# Patient Record
Sex: Female | Born: 1965 | Race: Black or African American | Hispanic: No | Smoking: Never smoker
Health system: Southern US, Community
[De-identification: ages and names within clinical notes are randomized; demographics above are authoritative.]

## PROBLEM LIST (undated history)

## (undated) DIAGNOSIS — E872 Acidosis: Secondary | ICD-10-CM

## (undated) DIAGNOSIS — N890 Mild vaginal dysplasia: Secondary | ICD-10-CM

## (undated) DIAGNOSIS — N6009 Solitary cyst of unspecified breast: Secondary | ICD-10-CM

## (undated) DIAGNOSIS — G4730 Sleep apnea, unspecified: Secondary | ICD-10-CM

## (undated) DIAGNOSIS — A64 Unspecified sexually transmitted disease: Secondary | ICD-10-CM

## (undated) DIAGNOSIS — H04123 Dry eye syndrome of bilateral lacrimal glands: Secondary | ICD-10-CM

## (undated) DIAGNOSIS — G039 Meningitis, unspecified: Secondary | ICD-10-CM

## (undated) DIAGNOSIS — E8729 Other acidosis: Secondary | ICD-10-CM

## (undated) DIAGNOSIS — F439 Reaction to severe stress, unspecified: Secondary | ICD-10-CM

## (undated) DIAGNOSIS — D069 Carcinoma in situ of cervix, unspecified: Secondary | ICD-10-CM

## (undated) DIAGNOSIS — E079 Disorder of thyroid, unspecified: Secondary | ICD-10-CM

## (undated) HISTORY — DX: Solitary cyst of unspecified breast: N60.09

## (undated) HISTORY — DX: Reaction to severe stress, unspecified: F43.9

## (undated) HISTORY — DX: Dry eye syndrome of bilateral lacrimal glands: H04.123

## (undated) HISTORY — DX: Unspecified sexually transmitted disease: A64

## (undated) HISTORY — DX: Acidosis: E87.2

## (undated) HISTORY — DX: Other acidosis: E87.29

## (undated) HISTORY — PX: ABDOMINAL HYSTERECTOMY: SHX81

## (undated) HISTORY — DX: Disorder of thyroid, unspecified: E07.9

## (undated) HISTORY — DX: Carcinoma in situ of cervix, unspecified: D06.9

## (undated) HISTORY — PX: TONSILLECTOMY AND ADENOIDECTOMY: SHX28

## (undated) HISTORY — DX: Sleep apnea, unspecified: G47.30

## (undated) HISTORY — DX: Meningitis, unspecified: G03.9

## (undated) HISTORY — DX: Mild vaginal dysplasia: N89.0

---

## 1992-04-28 DIAGNOSIS — D069 Carcinoma in situ of cervix, unspecified: Secondary | ICD-10-CM

## 1992-04-28 HISTORY — DX: Carcinoma in situ of cervix, unspecified: D06.9

## 1995-01-24 HISTORY — PX: CERVICAL BIOPSY  W/ LOOP ELECTRODE EXCISION: SUR135

## 1998-02-10 ENCOUNTER — Encounter: Admission: RE | Admit: 1998-02-10 | Discharge: 1998-05-11 | Payer: Self-pay | Admitting: Endocrinology

## 1998-02-21 ENCOUNTER — Other Ambulatory Visit: Admission: RE | Admit: 1998-02-21 | Discharge: 1998-02-21 | Payer: Self-pay | Admitting: *Deleted

## 1998-02-28 ENCOUNTER — Ambulatory Visit (HOSPITAL_COMMUNITY): Admission: RE | Admit: 1998-02-28 | Discharge: 1998-02-28 | Payer: Self-pay | Admitting: *Deleted

## 1998-05-06 ENCOUNTER — Other Ambulatory Visit: Admission: RE | Admit: 1998-05-06 | Discharge: 1998-05-06 | Payer: Self-pay | Admitting: *Deleted

## 1998-11-21 ENCOUNTER — Other Ambulatory Visit: Admission: RE | Admit: 1998-11-21 | Discharge: 1998-11-21 | Payer: Self-pay | Admitting: *Deleted

## 1999-07-22 ENCOUNTER — Other Ambulatory Visit: Admission: RE | Admit: 1999-07-22 | Discharge: 1999-07-22 | Payer: Self-pay | Admitting: *Deleted

## 2000-01-22 ENCOUNTER — Emergency Department (HOSPITAL_COMMUNITY): Admission: EM | Admit: 2000-01-22 | Discharge: 2000-01-22 | Payer: Self-pay | Admitting: Internal Medicine

## 2000-02-25 ENCOUNTER — Emergency Department (HOSPITAL_COMMUNITY): Admission: EM | Admit: 2000-02-25 | Discharge: 2000-02-25 | Payer: Self-pay | Admitting: Emergency Medicine

## 2000-03-07 ENCOUNTER — Emergency Department (HOSPITAL_COMMUNITY): Admission: EM | Admit: 2000-03-07 | Discharge: 2000-03-07 | Payer: Self-pay | Admitting: Emergency Medicine

## 2000-03-30 ENCOUNTER — Other Ambulatory Visit: Admission: RE | Admit: 2000-03-30 | Discharge: 2000-03-30 | Payer: Self-pay | Admitting: *Deleted

## 2000-06-08 ENCOUNTER — Encounter: Payer: Self-pay | Admitting: Allergy and Immunology

## 2000-06-08 ENCOUNTER — Encounter: Admission: RE | Admit: 2000-06-08 | Discharge: 2000-06-08 | Payer: Self-pay | Admitting: *Deleted

## 2000-09-21 ENCOUNTER — Emergency Department (HOSPITAL_COMMUNITY): Admission: EM | Admit: 2000-09-21 | Discharge: 2000-09-21 | Payer: Self-pay | Admitting: Emergency Medicine

## 2000-10-06 ENCOUNTER — Other Ambulatory Visit: Admission: RE | Admit: 2000-10-06 | Discharge: 2000-10-06 | Payer: Self-pay | Admitting: *Deleted

## 2001-04-05 ENCOUNTER — Other Ambulatory Visit: Admission: RE | Admit: 2001-04-05 | Discharge: 2001-04-05 | Payer: Self-pay | Admitting: *Deleted

## 2001-06-28 DIAGNOSIS — G039 Meningitis, unspecified: Secondary | ICD-10-CM

## 2001-06-28 HISTORY — DX: Meningitis, unspecified: G03.9

## 2001-07-27 ENCOUNTER — Encounter: Payer: Self-pay | Admitting: Emergency Medicine

## 2001-07-27 ENCOUNTER — Inpatient Hospital Stay (HOSPITAL_COMMUNITY): Admission: EM | Admit: 2001-07-27 | Discharge: 2001-07-28 | Payer: Self-pay

## 2001-09-27 ENCOUNTER — Emergency Department (HOSPITAL_COMMUNITY): Admission: EM | Admit: 2001-09-27 | Discharge: 2001-09-27 | Payer: Self-pay

## 2001-11-09 ENCOUNTER — Other Ambulatory Visit: Admission: RE | Admit: 2001-11-09 | Discharge: 2001-11-09 | Payer: Self-pay | Admitting: *Deleted

## 2002-06-28 HISTORY — PX: HAND SURGERY: SHX662

## 2002-08-26 ENCOUNTER — Observation Stay (HOSPITAL_COMMUNITY): Admission: EM | Admit: 2002-08-26 | Discharge: 2002-08-27 | Payer: Self-pay | Admitting: Emergency Medicine

## 2002-10-04 ENCOUNTER — Encounter: Admission: RE | Admit: 2002-10-04 | Discharge: 2003-01-02 | Payer: Self-pay | Admitting: Endocrinology

## 2002-11-12 ENCOUNTER — Other Ambulatory Visit: Admission: RE | Admit: 2002-11-12 | Discharge: 2002-11-12 | Payer: Self-pay | Admitting: *Deleted

## 2002-11-21 HISTORY — PX: COLPOSCOPY: SHX161

## 2003-01-15 ENCOUNTER — Encounter (INDEPENDENT_AMBULATORY_CARE_PROVIDER_SITE_OTHER): Payer: Self-pay | Admitting: Specialist

## 2003-01-15 ENCOUNTER — Observation Stay (HOSPITAL_COMMUNITY): Admission: RE | Admit: 2003-01-15 | Discharge: 2003-01-16 | Payer: Self-pay | Admitting: Obstetrics and Gynecology

## 2003-11-13 ENCOUNTER — Other Ambulatory Visit: Admission: RE | Admit: 2003-11-13 | Discharge: 2003-11-13 | Payer: Self-pay | Admitting: Obstetrics and Gynecology

## 2005-01-26 ENCOUNTER — Other Ambulatory Visit: Admission: RE | Admit: 2005-01-26 | Discharge: 2005-01-26 | Payer: Self-pay | Admitting: *Deleted

## 2006-02-04 ENCOUNTER — Other Ambulatory Visit: Admission: RE | Admit: 2006-02-04 | Discharge: 2006-02-04 | Payer: Self-pay | Admitting: Obstetrics and Gynecology

## 2006-03-15 ENCOUNTER — Ambulatory Visit (HOSPITAL_COMMUNITY): Admission: RE | Admit: 2006-03-15 | Discharge: 2006-03-15 | Payer: Self-pay | Admitting: Obstetrics and Gynecology

## 2007-01-12 ENCOUNTER — Emergency Department (HOSPITAL_COMMUNITY): Admission: EM | Admit: 2007-01-12 | Discharge: 2007-01-12 | Payer: Self-pay | Admitting: Emergency Medicine

## 2007-02-10 ENCOUNTER — Other Ambulatory Visit: Admission: RE | Admit: 2007-02-10 | Discharge: 2007-02-10 | Payer: Self-pay | Admitting: Obstetrics and Gynecology

## 2007-02-15 ENCOUNTER — Encounter: Admission: RE | Admit: 2007-02-15 | Discharge: 2007-02-15 | Payer: Self-pay | Admitting: Obstetrics and Gynecology

## 2007-03-01 ENCOUNTER — Ambulatory Visit: Payer: Self-pay | Admitting: Internal Medicine

## 2007-03-01 LAB — CONVERTED CEMR LAB: Tissue Transglutaminase Ab, IgA: 0.6 units (ref <7)

## 2007-09-21 DIAGNOSIS — F341 Dysthymic disorder: Secondary | ICD-10-CM | POA: Insufficient documentation

## 2007-09-21 DIAGNOSIS — E039 Hypothyroidism, unspecified: Secondary | ICD-10-CM | POA: Insufficient documentation

## 2007-09-21 DIAGNOSIS — E109 Type 1 diabetes mellitus without complications: Secondary | ICD-10-CM | POA: Insufficient documentation

## 2007-09-21 DIAGNOSIS — R110 Nausea: Secondary | ICD-10-CM | POA: Insufficient documentation

## 2007-11-27 DIAGNOSIS — A64 Unspecified sexually transmitted disease: Secondary | ICD-10-CM

## 2007-11-27 HISTORY — DX: Unspecified sexually transmitted disease: A64

## 2008-02-23 ENCOUNTER — Other Ambulatory Visit: Admission: RE | Admit: 2008-02-23 | Discharge: 2008-02-23 | Payer: Self-pay | Admitting: Obstetrics and Gynecology

## 2008-09-06 ENCOUNTER — Encounter: Admission: RE | Admit: 2008-09-06 | Discharge: 2008-09-06 | Payer: Self-pay | Admitting: Obstetrics and Gynecology

## 2008-09-20 ENCOUNTER — Encounter: Admission: RE | Admit: 2008-09-20 | Discharge: 2008-09-20 | Payer: Self-pay | Admitting: Obstetrics and Gynecology

## 2009-04-24 ENCOUNTER — Emergency Department (HOSPITAL_BASED_OUTPATIENT_CLINIC_OR_DEPARTMENT_OTHER): Admission: EM | Admit: 2009-04-24 | Discharge: 2009-04-24 | Payer: Self-pay | Admitting: Emergency Medicine

## 2009-06-28 DIAGNOSIS — G4730 Sleep apnea, unspecified: Secondary | ICD-10-CM

## 2009-06-28 HISTORY — DX: Sleep apnea, unspecified: G47.30

## 2010-03-11 ENCOUNTER — Encounter: Admission: RE | Admit: 2010-03-11 | Discharge: 2010-03-11 | Payer: Self-pay | Admitting: Obstetrics and Gynecology

## 2010-07-19 ENCOUNTER — Encounter: Payer: Self-pay | Admitting: Obstetrics and Gynecology

## 2010-10-01 LAB — URINALYSIS, ROUTINE W REFLEX MICROSCOPIC
Bilirubin Urine: NEGATIVE
Glucose, UA: >1000 mg/dL — AB
Hgb urine dipstick: NEGATIVE
Ketones, ur: 15 mg/dL — AB
Leukocytes, UA: NEGATIVE
Nitrite: NEGATIVE
Protein, ur: NEGATIVE mg/dL
Specific Gravity, Urine: 1.015 (ref 1.005–1.030)
Urobilinogen, UA: 0.2 mg/dL (ref 0.0–1.0)
pH: 5.5 (ref 5.0–8.0)

## 2010-10-01 LAB — COMPREHENSIVE METABOLIC PANEL
ALT: 13 U/L (ref 0–35)
AST: 22 U/L (ref 0–37)
Albumin: 4.1 g/dL (ref 3.5–5.2)
Alkaline Phosphatase: 86 U/L (ref 39–117)
BUN: 16 mg/dL (ref 6–23)
CO2: 28 mEq/L (ref 19–32)
Calcium: 9.5 mg/dL (ref 8.4–10.5)
Chloride: 101 mEq/L (ref 96–112)
Creatinine, Ser: 0.9 mg/dL (ref 0.4–1.2)
GFR calc Af Amer: >60 mL/min (ref >60)
GFR calc non Af Amer: >60 mL/min (ref >60)
Glucose, Bld: 345 mg/dL — ABNORMAL HIGH (ref 70–99)
Potassium: 4.1 mEq/L (ref 3.5–5.1)
Sodium: 140 mEq/L (ref 135–145)
Total Bilirubin: 0.6 mg/dL (ref 0.3–1.2)
Total Protein: 7.3 g/dL (ref 6.0–8.3)

## 2010-10-01 LAB — PREGNANCY, URINE: Preg Test, Ur: NEGATIVE

## 2010-10-01 LAB — URINE MICROSCOPIC-ADD ON

## 2010-10-01 LAB — GLUCOSE, CAPILLARY
Glucose-Capillary: 264 mg/dL — ABNORMAL HIGH (ref 70–99)
Glucose-Capillary: 399 mg/dL — ABNORMAL HIGH (ref 70–99)

## 2010-10-01 LAB — KETONES, QUALITATIVE: Acetone, Bld: NEGATIVE

## 2010-11-10 NOTE — Assessment & Plan Note (Signed)
[facility]                         GASTROENTEROLOGY OFFICE NOTE   NAME:[name], [name]                      MRN:          [id number]  DATE:03/01/2007                            DOB:          1965-08-10    REFERRING PHYSICIAN:  Tera Mater. Evlyn Kanner, M.D.   REASON FOR CONSULTATION:  Nausea and change in bowel habits.   HISTORY:  This is a 45 year old female with longstanding type 1 diabetes  mellitus, hypothyroidism, and anxiety with depression.  The patient is  referred today through the courtesy of Dr. Evlyn Kanner regarding nausea and  change in bowel habits.  The patient reports to me that in mid June she  developed problems with postprandial urgency associated with watery  stools.  She also has had problems with intermittent nausea, though, no  vomiting.  No significant abdominal pain or cramps, though she has had  bloating.  As time has gone on, her symptoms have improved somewhat.  Still with urgency, though, her stools are now soft.  She did have a flu-  like illness back in February of this year where she lost 10-15 pounds.  She is not certain whether she received antibiotics.  She denies having  had prior problems with her bowels.  Her diabetes control has been fair.  She also reports a great deal of stress regarding marital problems which  began around the time of her symptoms.  She does travel to [place] with  her job.  There has been no fevers, or bleeding.  She has had somewhat  decrease in her appetite.  During the course of the interview, the  patient is intermittently tearful.  No prior history of GI problems or  GI evaluations.   PAST MEDICAL HISTORY:  1. Type 1 diabetes mellitus x26 years.  2. Hypothyroidism.  3. Anxiety/depression.   PAST SURGICAL HISTORY:  1. Hysterectomy.  2. Correction of dislocated thumb.  3. Tonsillectomy.   ALLERGIES:  PENICILLIN, SULFA.   CURRENT MEDICATIONS:  1. Insulin pump.  2. Effexor XR 75 mg daily.  3. Levothroid 112 mcg daily.  4. Wellbutrin XL 300 mg daily.  5. Multivitamin.  6. Iron.  7. YAZ.  8. Lunesta at night.  9. She also has been using Zofran p.r.n.  10.Flonase p.r.n.   FAMILY HISTORY:  No family history of gastrointestinal malignancy,  sprue, or other relevant disorders.   SOCIAL HISTORY:  The patient is married with three stepdaughters.  She  lives with her family.  She attended college for two years.  She works  as a Occupational psychologist for [employer].  She does not smoke or  use alcohol.   REVIEW OF SYSTEMS:  Per diagnostic evaluation form.   PHYSICAL EXAMINATION:  GENERAL:  A well-appearing female in no acute  distress.  VITAL SIGNS:  Blood pressure 94/62, heart rate 92, weight 135 pounds.  She is 5 feet 4-1/2 inches in height.  HEENT:  Sclerae anicteric, conjunctivae pink.  Oral mucosa is intact.  There is no aphthous, ulcerations, or thrush.  LUNGS:  Clear.  HEART:  Regular.  ABDOMEN:  Soft without tenderness, mass, or  hernia.  Good bowel sounds  heard.  EXTREMITIES:  Without edema.   LABORATORY DATA:  Hemoglobin A1C from January 27, 2007, was 8.3.   IMPRESSION:  1. Recent problems with altered bowel habits as manifested by      postprandial urgency with loose stools.  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  Possibly      exacerbated by stress.  Alternatively, she could have infectious      diarrhea to agents such as Giardia or possibly bacterial overgrowth      relating to her longstanding diabetes.  In addition, occult sprue      should be excluded.  This does not sound like autonomic (diabetic)      diarrhea.  The overall trend seems to be toward improvement.  2. Nausea, transient and mild, now resolved.   RECOMMENDATIONS:  1. Obtain tissue transglutaminase antibody as a screening test for      sprue.  2. Treat with Flagyl 250 mg p.o. t.i.d. as well as the probiotic      Align, one daily for  two weeks.  3. Office follow-up in four weeks.     Wilhemina Bonito. Marina Goodell, MD  Electronically Signed    JNP/MedQ  DD: 03/01/2007  DT: 03/02/2007  Job #: [id number]   cc:   Jeannett Senior A. Evlyn Kanner, M.D.

## 2010-11-13 NOTE — H&P (Signed)
NAMEAILYNE, PAWLEY                         ACCOUNT NO.:  [id number]   MEDICAL RECORD NO.:  [id number]                   PATIENT TYPE:  INP   LOCATION:  1827                                 FACILITY:  [facility]   PHYSICIAN:  Mark A. Perini, M.D.                DATE OF BIRTH:  March 15, 1966   DATE OF ADMISSION:  08/25/2002  DATE OF DISCHARGE:                                HISTORY & PHYSICAL   CHIEF COMPLAINT:  Overdose.   HISTORY OF PRESENT ILLNESS:  [name] [name] is a pleasant 45 year old female  with a history of type 1 diabetes mellitus who presented via EMS with  lethargy and tachycardia.  She reported taking six Benadryl tablets, three  Sonata tablets, and a handful of Effexor tablets.  She also reports taking  two or three Wellbutrin tablets, and 40 units at least of Humalog as a  bolus.  She denies trying to harm herself, and states that she just wanted  to go to sleep.  Her husband reports that she was in bed all day and seemed  depressed.  She has done this on the last two or three Saturdays.  She has  had some increased work stress lately.  There is no clear relationship  problem noted.  She denies any suicidal ideation, homicidal ideation, or  psychotic features; however, she does let on to having some depression.  She, in the emergency room, required two amps of D-50 for low blood sugar.  She has been given charcoal via the NG tube.  The time of overdose was most  likely sometime between 8 and 9:30 p.m.   PAST MEDICAL HISTORY:  1. Type 1 diabetes mellitus, on Humalog pump.  2. Hypothyroidism.  3. History of urosepsis in 1/03.  4. History of migraines.  5. History of depression.  6. Mild hearing loss diagnosed recently.   ALLERGIES:  1. SULFA.  2. PENICILLIN.   MEDICATIONS:  1. Humalog pump.  2. Synthroid, unclear dose.  3. Effexor 75 mg, unclear dose.  4. Allegra, unclear dose.  5. Sonata 10 mg as needed.  6. Benadryl.  7. Wellbutrin, unclear dose.   SOCIAL  HISTORY:  She is married since 2002.  She uses no tobacco, no  alcohol, no drugs.  Her husband's name is Richard.  She has no children.  She works in an office.  She is seekingly looking for another job, as she  has had some job stress lately.   FAMILY HISTORY:  Father died at age 41 after having had a kidney transplant  for approximately six years.  Mother is still alive in her 9s or 83s, but  in good health.  She has one older sister who is healthy.   REVIEW OF SYSTEMS:  The patient simply reports that she has had a bad day,  but she cannot elaborate on what has made the day bad.  She denies any  chest  pain, shortness of breath, or other pain.  She denies any abdominal pain.  She has had normal bowel and bladder function lately.  Her last menstrual  period was two weeks ago, and was normal for her.   PHYSICAL EXAMINATION:  VITAL SIGNS:  Blood pressure now 87/50 (it was 110/61  on admission), pulse now 120s (it was 140 on admission), respiratory rate  20, temperature 99.1, 100% saturation on room air.  GENERAL:  She is lethargic but awake and responds appropriately to questions  and commands.  Pupils equal, round and reactive to light.  There is no  icterus.  There is no JVD.  LUNGS:  Clear to auscultation bilaterally.  HEART:  Regular rate and rhythm with no murmur.  ABDOMEN:  Soft, nontender, nondistended.  No masses or hepatosplenomegaly.  EXTREMITIES:  There is no edema.  She moves extremities x4.   LABORATORY DATA:  hCG is negative.  Negative tricyclic screen.  Urine drug  screen is negative.  Salicylate level is 7.3.  Acetaminophen level is less  than 10.0.  Alcohol level is less than 5.  The pH is 7.41, PCO2 40, bicarb  25, sodium 140, potassium 3.1, chloride 106, CO2 25, BUN 15, creatinine 0.9,  glucose 80, anion gap 12.  EKG reveals sinus tachycardia, but is otherwise  normal.  White count is 9.9 with 77% segs, 18% lymphocytes, 4% monocytes,  hemoglobin 13.5, platelet count  256,000.   ASSESSMENT AND PLAN:  [name] [name] presents with an intentional overdose of  Humalog insulin, Effexor, Sonata, Wellbutrin, and Benadryl.  She has been  given charcoal.  We will keep the NG tube in place.  She is mildly  hypotensive.  We are giving fluid boluses or normal saline.  We will admit  to the intensive care unit.  Will place on suicide precautions and put a  sitter in her room.  She will need blood sugar checks every hour, and D-50  ampules as needed.  The patient's status is guarded currently.  We will also  check a TSH, hemoglobin, A1C, magnesium, and phosphorus level.  She will  need psychiatric consultation once medically stable.                                               Mark A. Waynard Edwards, M.D.    MAP/MEDQ  D:  08/26/2002  T:  08/26/2002  Job:  [id number]   cc:   Jeannett Senior A. Evlyn Kanner, M.D.  [address]  [place]  [place] [address]  Fax: [phone]

## 2010-11-13 NOTE — H&P (Signed)
[facility]  Patient:    [name] [name], [name] [name] Visit Number: [id number] MRN: [id number]          Service Type: EMS Location: [facility] Attending Physician:  Pearletha Alfred Dictated by:   Pearletha Furl Jacky Kindle, M.D. Admit Date:  07/26/2001                           History and Physical  CHIEF COMPLAINT: Headache, nausea, eyes red.  HISTORY OF PRESENT ILLNESS: This is a 45 year old very nice young lady, type 1 diabetic, on insulin pump, presenting at this time upon referral from Dr. Timothy Lasso at the office for evaluation of her headache.  She was evidently seen in the office late in the day on July 26, 2001 and found to have severe headache, nausea and vomiting, and findings felt compatible with a typical migraine.  She had no evidence of meningeal signs at that time but only had a throbbing headache, body aches, conjunctivitis, and arthralgias.  Blood sugars have been relatively stable, and she was treated with Phenergan and Zomig with dramatic improvement in her headache.  She was referred to the emergency room and, unfortunately, she was not seen and evaluated until sometimes after midnight, myself having been contacted at approximately 3 a.m.  The working diagnosis was probable UTI but she was orthostatic, having dropped her pressure upon standing down to 60, requiring a fluid bolus.  Therefore, I was contacted for admission for IV fluids.  Additionally, she was given 2 g Rocephin.  Upon my evaluation at 3 a.m. she relates that she is actually much better following the Zomig in the office and the headache is 70-80% better. She has had no rash and despite initial nausea and vomiting has had no further symptomatology as well.  She denies any cough or congestion.  She has had no rash.  Bowel habits have been normal.  She has related some mild dysuria.  She does relate that the light bothers her eyes, but has had no neck stiffness.  ALLERGIES:  1. SULFA.  2.  PENICILLIN.  CURRENT MEDICATIONS:  1. Currently on an insulin pump with continuous infusion of Humalog insulin.  2. Synthroid.  3. Ambien.  4. Effexor.  5. Allegra.  PAST MEDICAL HISTORY:  1. Diabetes mellitus, type 1, long-standing.  2. Hypothyroidism.  SOCIAL HISTORY: The patient is married.  Does not smoke or drink.  She appears to be a fairly compliant diabetic, according to outside records.  PHYSICAL EXAMINATION:  VITAL SIGNS: Temperature 100.9 degrees, blood pressure 107/67, pulse 100 and regular, respiratory rate 18 and unlabored.  GENERAL: The patient appears ill.  According to husband, seems better.  HEENT: Normocephalic, atraumatic.  Mild periorbital edema.  Significant conjunctival injection, both palpebral and conjunctival mucosa.  PERRL.  No papilledema.  EOMI.  Visual fields intact.  NECK: Supple.  No JVD.  Negative Kernigs or Brudzinskis sign.  No meningismus.  Carotids 2+/4+.  No bruits.  LUNGS: Clear bilaterally.  CARDIOVASCULAR: Regular rate and rhythm.  No murmur, gallop, rub, or heave.  ABDOMEN: Soft, nontender.  No hepatosplenomegaly.  No rebound or guarding.  EXTREMITIES: No clubbing, cyanosis, or edema.  Joints without effusions.  No rashes.  Warm distally, intact pulses.  NEUROLOGIC: Awake and alert, interactive, oriented x4.  Full detailed examination normal.  ASSESSMENT:  1. Febrile illness with headache, conjunctivitis, and pyuria.  Considerations     include urinary tract infection, viral syndrome with element of  migraine.     No evidence of meningitis, albeit the patient has already had 2 g     Rocephin, and further consideration will be undertaken by primary     physician when seen on rounds in a few hours, depending on comparison to     prior morning as to whether true lumbar puncture or neurology consultation     is needed or infectious disease consultation, but I suspect this is either     urinary tract infection or viral illness  triggering the migraine headache.  2. Diabetes mellitus, type 1, controlled.  No evidence of diabetic     ketoacidosis.  3. Orthostasis secondary to #1.  To be hydrated aggressively.  No evidence of     frank septic syndrome, with relatively normal laboratories including WBC. Dictated by:   Pearletha Furl Jacky Kindle, M.D. Attending Physician:  Pearletha Alfred DD:  07/27/01 TD:  07/27/01 Job: [id number]

## 2010-11-13 NOTE — Op Note (Signed)
[name] [name], [name] [name]                         ACCOUNT NO.:  [id number]   MEDICAL RECORD NO.:  [id number]                   PATIENT TYPE:  OBV   LOCATION:  9302                                 FACILITY:  [facility]   PHYSICIAN:  Laqueta Linden, M.D.                 DATE OF BIRTH:  [birth year]   DATE OF PROCEDURE:  01/15/2003  DATE OF DISCHARGE:                                 OPERATIVE REPORT   PREOPERATIVE DIAGNOSIS:  Recurrent cervical intraepithelial neoplasia III.   POSTOPERATIVE DIAGNOSIS:  Recurrent cervical intraepithelial neoplasia III.   PROCEDURE:  Transvaginal hysterectomy.   SURGEON:  Laqueta Linden, M.D.   ASSISTANT:  Andres Ege, M.D.   ANESTHESIA:  General LMA.   ESTIMATED BLOOD LOSS:  Less than 50 mL.   URINE OUTPUT:  75 mL.   FLUIDS REPLACED:  1500 mL crystalloid.   COUNTS:  Correct x2.   COMPLICATIONS:  None.   Intraoperative CBG 141.   INDICATIONS:  [name] [name] is a 45 year old para 0 female with recurrent  CIN-III status post LEEP conization, who now has a detached fragment of high-  grade dysplasia in her endocervix, who is to undergo vaginal hysterectomy  versus abdominal hysterectomy for recurrent CIN.  Her history is complicated  by the fact that she has never had a vaginal delivery and has a minimal  amount of cervicovaginal descent.  A vaginal hysterectomy will be attempted,  but she is aware that an abdominal approach may be necessary depending on  intraoperative findings.  Additionally, she is an insulin-dependent diabetic  on an insulin pump.  She and her husband have seen the informed consent film  and have voiced their understanding and acceptance of all risks, benefits,  alternatives, and complications, including but not limited to anesthesia  risk, infection, bleeding possibly requiring transfusion, injury to bowel,  bladder, ureters, vessels, nerves, the possibility of fistula formation,  permanent and irreversible sterilization, as  well as the alternative of a  repeat cervical conization.  Additional risks which are increased due to her  diabetes include infection and poor healing as well as difficulty with  glucose control.  Other risks including DVT, PE, pneumonia, death, and other  unnamed risks were also discussed.  They have voiced their understanding and  acceptance of all risks, understand that this will provide permanent and  irreversible contraception, and agree to proceed.   PROCEDURE:  The patient was taken to the operating room and after proper  identification and consents were ascertained, she was placed on the  operating table in the supine position.  She had received Ancef 1 g IV  antibiotic prophylaxis preoperatively.  Preoperative urine pregnancy test  the morning of surgery was negative.  CBGs were less than 150 per the  patient and hospital check perioperatively.  After the induction of general  LMA, she was placed into the Laird stirrups and the perineum and  vagina were  prepped and draped in the routine sterile fashion.  The bladder was emptied  with a red rubber catheter.  A weighted speculum was placed in the posterior  vagina.  The cervix was noted to be scarred from her prior conization.  The  cervix was grasped with a single-tooth tenaculum with a slight amount of  descent noted.  The portio was then injected with 10 mL of 1:100,000  epinephrine circumferentially.  The posterior cervix was then incised with a  scalpel and the vaginal mucosa advanced off of the cervix bluntly.  The  posterior mucosa was then tented down and the cul-de-sac entered sharply  with curved Mayo scissors.  The incision on the cervix was then carried  around anteriorly with advancement of the vaginal tissues off of the cervix  bluntly with the finger and a Ray-Tec sponge.  The uterosacral ligaments  were clamped, cut, and Heaney-ligated with 0 Vicryl suture and tagged  bilaterally.  A slight amount of additional  descent was noted.  The anterior  peritoneal reflection was then identified and entered sharply using curved  Metzenbaum scissors.  There was no evidence of injury or entry into the  bladder.  At this point a curved Heaney clamp was placed across the cardinal  ligament-broad ligament complex, incorporating both the anterior and  posterior peritoneum, with pedicles cut and suture ligated.  Improved  descent was noted, and the uterus was noted to be quite small with a fundal  serosal fibroid.  Both tubes and ovaries appeared normal.  Curved Heaney  clamps were placed across the proximal vascular pedicles bilaterally, and  the pedicles were cut and triply ligated with two free ties and a stitch of  0 Vicryl.  Both tubes and ovaries appeared normal.  The adnexal pedicles  were noted to be hemostatic and were released into the peritoneal cavity.  The posterior vaginal cuff was then reefed to the posterior peritoneum with  0 Vicryl locking suture.  The parietal peritoneum was then closed in a  pursestring fashion with exteriorization of all pedicles other than the  adnexal pedicles.  Hemostasis was noted to be excellent.  Counts were  correct prior to closure of the peritoneum.  The vaginal cuff was then  closed from side to side using interrupted figure-of-eight sutures of 0  Vicryl.  The uterosacral tags were then tied in the midline.  Reinsertion of  the red rubber catheter revealed no urine output.  It was then felt that  this catheter was possibly clotted with K-Y jelly because insertion of a  Foley catheter had prompt efflux of 75 mL of clear urine.  The Foley  catheter will be left in place in the PACU and if urine output is  maintained, it will be removed prior to transfer to the floor.  Intraoperative CBG was 141.  The patient was extubated and stable on  transfer to the recovery room.  She will have hourly monitoring of CBGs, followed by q.4h., with adjustment in her Novolog insulin  pump as necessary.  Of note, the specimen was tagged on the cervix at 12 o'clock with a suture  and was sent fresh to pathology so that the cervix can be cut in a cone  fashion.   The patient was stable on transfer to the recovery room.  Estimated blood  loss less than 50 mL.  Complications:  None.  Laqueta Linden, M.D.    LKS/MEDQ  D:  01/15/2003  T:  01/15/2003  Job:  [id number]   cc:   Jeannett Senior A. Evlyn Kanner, M.D.  [address]  [place]  [place] [address]  Fax: [phone]

## 2010-11-13 NOTE — Discharge Summary (Signed)
[place]  Patient:    [name] [name], [name] [name] Visit Number: [id number] MRN: [id number]          Service Type: MED Location: [id number] Attending Physician:  Gwen Pounds Dictated by:   Gwen Pounds, M.D. Admit Date:  07/26/2001 Discharge Date: 07/28/2001   CC:         Jeannett Senior A. Evlyn Kanner, M.D.  Richard A. Jacky Kindle, M.D.   Discharge Summary  PRIMARY CARE Kostantinos Tallman:  Tera Mater. [place], M.D.  DISCHARGE MEDICATIONS:  Home medications, insulin pump, and finish Tequin 400 mg for a five more day course.  DISCHARGE DIAGNOSES: 1. Urosepsis. 2. Dehydration. 3. Severe headache. 4. Possible viral prodrome. 5. Insulin-dependent diabetes mellitus. 6. Hypothyroid.  DISCHARGE PROCEDURES: 1. Cranial CT without evidence of intracranial hemorrhage, brain edema, or    mass effect.  The ventricles are normal.  Therefore, otherwise negative    noncontrast cranial CT. 2. Chest x-ray.  No acute disease.  HISTORY OF PRESENT ILLNESS:  Briefly, this is a 45 year old African American female with diabetes mellitus and hypothyroid with history of migraine headaches, for which she had taken Zomig in the past.  She came into the office on January 29 with complaint of a headache across the forehead, throbbing, minimal neck symptoms with myalgias, nausea, vomiting x1, significant photophobia, achy joint swelling, unable to keep down p.o., and looking and feeling awful.  She denied chest pain, shortness of breath, URI, UTI, confusion, or other symptoms at that point.  Workup was relatively negative except she appeared ill.  She did not have any meningeal signs.  Her conjunctivae were indurated.  The initial plan was to give Phenergan and Zomig, follow her for a period of time, and see how well she did. Unfortunately, after 40 minutes of this type of treatment, she still felt fairly awful.  When she stood up, she was orthostatic.  Therefore, we sent her to the ED.  In the  ED, she had a cranial CT, chest x-ray, and labs.  Chest CT and chest x-ray were normal.  Labs were all within normal limits except for the fact that she had a large leukocyte esterase with urine microscopic showing 21-50 white blood cells per high-power field and many bacteria.  She was also significantly orthostatic, consistent with dehydration.  Dr. Jacky Kindle was called for inpatient admission.  Dr. Jacky Kindle also felt that she did not have meningeal signs and did not feel strongly about a lumbar puncture.  In the ED, she received 2 g of Rocephin and was placed on Tequin for her urinary tract infection.  By the following morning, at 8 a.m., I had seen her and her headache was markedly improved.  She was very tired, still with indurated eyes, still no meningeal signs, but was only starting to tolerate some orals. She was still having some orthostasis.  She was given aggressive IV fluids. Throughout the day, she continued to improve.  She started taking p.o., all her vital signs were stable and normal, no more fevers, and the following morning, on July 28, 2001, [name] [name] was close to baseline, no headache, and ready for discharge.  All cultures have been negative to date.  One other test that was ordered was a sed rate.  The sed rate was 14, well within normal limits.  She will continue a seven-day course of antibiotics and will be given Tequin on discharge.  To sum up, what it seems like happened with Ms. Nabor is that she  developed a pretty significant urinary tract infection, significant dehydration, developed a severe headache, and continued to get more and more ill.  I could not explain some of the arthralgias and myalgias and the indurated conjunctivae except for either it is related to the headache or there is also viral prodrome going on and this is starting to get better on its own.  DISCHARGE ACTIVITY:  She is to return to work on Monday.  DISCHARGE DIET:  Diabetic  diet.  AFTERCARE FOLLOWUP INSTRUCTIONS:  She is to call Lupita Leash at Dr. Lyndle Herrlich office and get an appointment in 1-2 weeks or sooner if needed. Dictated by:   Gwen Pounds, M.D. Attending Physician:  Gwen Pounds DD:  07/28/01 TD:  07/28/01 Job: [id number]

## 2010-12-27 HISTORY — PX: SOFT TISSUE CYST EXCISION: SHX2418

## 2011-01-06 ENCOUNTER — Telehealth (INDEPENDENT_AMBULATORY_CARE_PROVIDER_SITE_OTHER): Payer: Self-pay | Admitting: General Surgery

## 2011-01-06 NOTE — Telephone Encounter (Signed)
I printed and gave to Annabelle Harman, who does this job. She will fax the appropriate information to [facility].

## 2011-01-07 DIAGNOSIS — N6089 Other benign mammary dysplasias of unspecified breast: Secondary | ICD-10-CM

## 2011-01-28 ENCOUNTER — Ambulatory Visit (INDEPENDENT_AMBULATORY_CARE_PROVIDER_SITE_OTHER): Payer: 59 | Admitting: General Surgery

## 2011-01-28 ENCOUNTER — Encounter (INDEPENDENT_AMBULATORY_CARE_PROVIDER_SITE_OTHER): Payer: Self-pay | Admitting: General Surgery

## 2011-01-28 DIAGNOSIS — Z9889 Other specified postprocedural states: Secondary | ICD-10-CM

## 2011-01-28 DIAGNOSIS — Z09 Encounter for follow-up examination after completed treatment for conditions other than malignant neoplasm: Secondary | ICD-10-CM

## 2011-01-28 DIAGNOSIS — Z872 Personal history of diseases of the skin and subcutaneous tissue: Secondary | ICD-10-CM

## 2011-01-28 NOTE — Progress Notes (Signed)
Procedure: Status post excision of left breast epidermoid inclusion cyst January 07, 2011  Chief complaint: Postop  History of Present Ilness: 45 year old female comes in for her postoperative appointment after undergoing the above mentioned procedure. She has no questions or concerns today. She denies any fevers or chills. She denies any drainage from the incision. She denies any pain around the incision. She denies any redness. She is not taking any pain medicine.  Physical Exam: Well-developed well-nourished female in no apparent distress Breasts-left breast reveals a well-healed transverse incision from the 11:00 to 1:00 position. No hematoma or seroma. No cellulitis.   Assessment and Plan: Status post excision of left breast epidermoid inclusion cyst. Her incision looks great. I explained to the patient that this cyst was simply a sebaceous cyst. Therefore I did not send it to pathology. I advised her to place sunscreen over the incision should that area be expose to direct sun light. Have released her to full activity. We'll see her on a p.r.n. basis

## 2011-03-26 ENCOUNTER — Other Ambulatory Visit: Payer: Self-pay | Admitting: Obstetrics and Gynecology

## 2011-03-26 DIAGNOSIS — Z1231 Encounter for screening mammogram for malignant neoplasm of breast: Secondary | ICD-10-CM

## 2011-04-02 ENCOUNTER — Ambulatory Visit (HOSPITAL_COMMUNITY)
Admission: RE | Admit: 2011-04-02 | Discharge: 2011-04-02 | Disposition: A | Discharge status: Home / Self Care | Payer: 59 | Source: Ambulatory Visit | Attending: Obstetrics and Gynecology | Admitting: Obstetrics and Gynecology

## 2011-04-02 DIAGNOSIS — Z1231 Encounter for screening mammogram for malignant neoplasm of breast: Secondary | ICD-10-CM | POA: Insufficient documentation

## 2011-04-12 LAB — BASIC METABOLIC PANEL
BUN: 12
CO2: 29
Calcium: 9.9
Chloride: 99
Creatinine, Ser: 0.96
GFR calc Af Amer: >60
GFR calc non Af Amer: >60
Glucose, Bld: 230 — ABNORMAL HIGH
Potassium: 4.2
Sodium: 137

## 2011-04-12 LAB — URINALYSIS, ROUTINE W REFLEX MICROSCOPIC
Bilirubin Urine: NEGATIVE
Glucose, UA: 250 — AB
Hgb urine dipstick: NEGATIVE
Ketones, ur: 40 — AB
Nitrite: NEGATIVE
Protein, ur: NEGATIVE
Specific Gravity, Urine: 1.022
Urobilinogen, UA: 0.2
pH: 7

## 2011-04-12 LAB — PREGNANCY, URINE: Preg Test, Ur: NEGATIVE

## 2011-04-12 LAB — URINE MICROSCOPIC-ADD ON

## 2011-06-08 ENCOUNTER — Encounter: Payer: Self-pay | Admitting: Neurology

## 2011-06-29 DIAGNOSIS — N890 Mild vaginal dysplasia: Secondary | ICD-10-CM

## 2011-06-29 HISTORY — DX: Mild vaginal dysplasia: N89.0

## 2012-05-05 HISTORY — PX: COLPOSCOPY: SHX161

## 2012-06-07 ENCOUNTER — Other Ambulatory Visit: Payer: Self-pay | Admitting: Obstetrics and Gynecology

## 2012-06-07 DIAGNOSIS — Z1231 Encounter for screening mammogram for malignant neoplasm of breast: Secondary | ICD-10-CM

## 2012-06-20 ENCOUNTER — Ambulatory Visit
Admission: RE | Admit: 2012-06-20 | Discharge: 2012-06-20 | Disposition: A | Discharge status: Home / Self Care | Payer: 59 | Source: Ambulatory Visit | Attending: Obstetrics and Gynecology | Admitting: Obstetrics and Gynecology

## 2012-06-20 DIAGNOSIS — Z1231 Encounter for screening mammogram for malignant neoplasm of breast: Secondary | ICD-10-CM

## 2012-06-20 LAB — HM MAMMOGRAPHY

## 2012-06-28 DIAGNOSIS — M069 Rheumatoid arthritis, unspecified: Secondary | ICD-10-CM

## 2012-06-28 HISTORY — DX: Rheumatoid arthritis, unspecified: M06.9

## 2012-09-08 LAB — HM PAP SMEAR

## 2012-09-22 ENCOUNTER — Telehealth: Payer: Self-pay | Admitting: Obstetrics and Gynecology

## 2012-09-22 NOTE — Telephone Encounter (Signed)
Patient notified pap from 09-08-12 (prior to [facility]) was normal and Dr Tresa Res wants patient to have next pap in 6 months with Annual and patient is scheduled for 03-09-13.

## 2012-09-22 NOTE — Telephone Encounter (Signed)
Pt calling to get lab results.

## 2013-02-23 ENCOUNTER — Ambulatory Visit (INDEPENDENT_AMBULATORY_CARE_PROVIDER_SITE_OTHER): Payer: 59 | Admitting: Neurology

## 2013-02-23 ENCOUNTER — Encounter: Payer: Self-pay | Admitting: Neurology

## 2013-02-23 VITALS — BP 103/63 | HR 92 | Temp 98.2°F | Ht 66.0 in | Wt 147.0 lb

## 2013-02-23 DIAGNOSIS — G47419 Narcolepsy without cataplexy: Secondary | ICD-10-CM

## 2013-02-23 DIAGNOSIS — G4733 Obstructive sleep apnea (adult) (pediatric): Secondary | ICD-10-CM

## 2013-02-23 NOTE — Patient Instructions (Addendum)
I think overall you are doing fairly well but I do want to suggest a few things today:  Please reduce your caffeine intake leading up to your sleep tests.  As far as your medications are concerned, I would like to suggest    As far as diagnostic testing: we will do a cpap titration, to reevaluate your cpap pressure and we will keep you the next day all day for nap test as described.   I would like to see you back after the sleep tests are completed.   Please also call us for any test results so we can go over those with you on the phone. Brett Canales is my clinical assistant and will answer any of your questions and relay your messages to me and also relay most of my messages to you.  Our phone number is [phone]. We also have an after hours call service for urgent matters and there is a physician on-call for urgent questions. For any emergencies you know to call 911 or go to the nearest emergency room.

## 2013-02-23 NOTE — Progress Notes (Signed)
Subjective:    Patient ID: [name] [name] is a 47 y.o. female.  HPI  Huston Foley, MD, PhD [facility] [address], [address] [address] [place], [place] [address]  Dear Dr. Evlyn Kanner,   I saw your patient, [name] [name], upon your kind request in my neurologic clinic today for initial consultation of Her sleep disorder, in particular her prior Dx of OSA and her residual sleepiness. The patient is unaccompanied today. As you know, [name] [name] is a very pleasant 47 y.o.-year-old Right handed female, with an underlying history of hypothyroidism, DM type I, anxiety and depression, who presents for re-evaluation of her OSA and associated sleepiness. She was diagnosed with OSA at the end of 2012 and was given a CPAP in early 2013, and felt improved. She is using nasal pillows, size XS and was kind enough to bring her machine in. While we were not able to download compliance data from the SD card, review of information from her CPAP machine reveals a pressure of 9 cm, with CFLEX of 2 and ramp time of 20 min, starting at 4 cm. She is indicating full compliance with CPAP. In the last 30 days she used CPAP above 4 hours each night for 29 days. She had initial improvement in her EDS, but in the last couple of months, she has been very sleepy again. She wonders if her CPAP pressure needs to be increased. She has fallen asleep talking on the phone and has dozed off at work after lunch. She has had some sleep disruption at night, but denies nocturia.  Her typical bedtime is reported to be around 9 to 10 PM and usual wake time is around 6:30 AM. Sleep onset typically occurs within a few minutes. She reports feeling marginally rested upon awakening. She wakes up on an average 2 times in the middle of the night and has to go to the bathroom 0 times on a typical night. She denies frequent morning headaches.  She reports severe excessive daytime somnolence (EDS) and Her Epworth Sleepiness Score (ESS)  is 23/24 today. She has fallen asleep while driving in the past. The patient has been taking a scheduled nap on Fridays and on the WE, which is usually 3-4 hours long. She sleeps throughout the WE. She denies dreaming in a nap and denies feeling refreshed after a nap.  She has been known to snore for the past few years. Snoring is reportedly marked, and associated with choking sounds and witnessed apneas. The patient denies a sense of choking or strangling feeling. There is no report of nighttime reflux, with no nighttime cough experienced. The patient has not noted any RLS symptoms and is not known to kick while asleep or before falling asleep. There is family history of OSA in several paternal uncles. No narcolepsy in the family.  She is a restless sleeper and in the morning, the bed is quite disheveled.   She denies cataplexy, hypnagogic or hypnopompic hallucinations, but has had some episodes of sleep paralysis which she describes as fairly frightening and she has had a fairly quick onset sleep at times, even while talking to someone on the phone. She does not report any vivid dreams, nightmares, dream enactments, or parasomnias, such as sleep talking or sleep walking.  I was able to review her baseline sleep study report from 06/08/2011 which showed a sleep efficiency of 85.7%, REM latency was very long at 231 minutes. She had a total of 33 obstructive hypopneas, with an overall  AHI of 6.3 per hour and a REM related AHI of 23.4 per hour. Baseline oxygen saturation was 93%, her nadir was 88%. Snoring was observed to be loud. She had no apparent leg movements of sleep. She then came back for a CPAP titration study which I reviewed from 06/20/2011. She is a sleep efficiency of 97.5% with a REM latency of 121 minutes. She had resolution of her sleep disorder breathing at a pressure of 9 cm. Her baseline oxygen saturation was 94%, her nadir was 88%. Both sleep studies were done at the [facility] heart and sleep  Center on [facility].  She admits to consuming a lot of caffeine and eat every day. She has at least 2 cups of coffee in the morning and then has a wall T. vitamin that has caffeine in it. In addition she drinks beverages that coming form of powder that have caffeine in it so all in all she drinks at least 5-6 caffeine in beverages per day.    Her Past Medical History Is Significant For: Past Medical History  Diagnosis Date  . Diabetes mellitus   . Thyroid disease   . Stress   . Breast cyst     left    Her Past Surgical History Is Significant For: Past Surgical History  Procedure Laterality Date  . Abdominal hysterectomy      2004  . Tonsillectomy and adenoidectomy    . Hand surgery  2004    tore tendon  . Soft tissue cyst excision  12/2010    left breast epidermoid inclusion cyst    Her Family History Is Significant For: Family History  Problem Relation Age of Onset  . Other Mother     cysts  . Diabetes Father   . Kidney disease Father   . Stroke Paternal Aunt     Her Social History Is Significant For: History   Social History  . Marital Status: Divorced    Spouse Name: N/A    Number of Children: N/A  . Years of Education: N/A   Social History Main Topics  . Smoking status: Never Smoker   . Smokeless tobacco: Never Used  . Alcohol Use: 1.2 oz/week    2 Glasses of wine per week  . Drug Use: No  . Sexual Activity: None   Other Topics Concern  . None   Social History Narrative  . None    Her Allergies Are:  Allergies  Allergen Reactions  . Nutritional Supplements   . Penicillins   . Sulfa Antibiotics   :   Her Current Medications Are:  Outpatient Encounter Prescriptions as of 02/23/2013  Medication Sig Dispense Refill  . ALPRAZolam (XANAX) 0.5 MG tablet Take 0.5 mg by mouth as needed.        Marland Kitchen buPROPion (ZYBAN) 150 MG 12 hr tablet Take 450 mg by mouth daily.      Marland Kitchen escitalopram (LEXAPRO) 10 MG tablet Take 10 mg by mouth daily.        Marland Kitchen estrogens  conjugated, synthetic B, (ENJUVIA) 1.25 MG tablet Take 1.25 mg by mouth daily.        . insulin lispro (HUMALOG) 100 UNIT/ML injection Inject 100 Units into the skin daily.      Marland Kitchen levothyroxine (SYNTHROID, LEVOTHROID) 112 MCG tablet Take 112 mcg by mouth daily.        . [DISCONTINUED] insulin aspart (NOVOLOG) 100 UNIT/ML injection Inject into the skin daily.        . [DISCONTINUED] buPROPion (  WELLBUTRIN XL) 150 MG 24 hr tablet Take 150 mg by mouth daily.         No facility-administered encounter medications on file as of 02/23/2013.    Review of Systems  Constitutional: Positive for fatigue.  Cardiovascular:       Murmur  Endocrine:       Feeling cold  Psychiatric/Behavioral:       Sleepiness, decreased energy, not enough sleep    Objective:  Neurologic Exam  Physical Exam Physical Examination:   Filed Vitals:   02/23/13 0906  BP: 103/63  Pulse: 92  Temp: 98.2 F (36.8 C)    General Examination: The patient is a very pleasant 47 y.o. female in no acute distress. She appears well-developed and well-nourished and very well groomed.   HEENT: Normocephalic, atraumatic, pupils are equal, round and reactive to light and accommodation. Funduscopic exam is normal with sharp disc margins noted. Extraocular tracking is good without limitation to gaze excursion or nystagmus noted. Normal smooth pursuit is noted. Hearing is grossly intact. Tympanic membranes are clear bilaterally. Face is symmetric with normal facial animation and normal facial sensation. Speech is clear with no dysarthria noted. There is no hypophonia. There is no lip, neck/head, jaw or voice tremor. Neck is supple with full range of passive and active motion. There are no carotid bruits on auscultation. Oropharynx exam reveals: mild mouth dryness, good dental hygiene and mild airway crowding, due to narrow airway. Mallampati is class II. Tongue protrudes centrally and palate elevates symmetrically. Tonsils are absent. Neck  size is 13.5 inches.   Chest: Clear to auscultation without wheezing, rhonchi or crackles noted.  Heart: S1+S2+0, regular and normal without murmurs, rubs or gallops noted.   Abdomen: Soft, non-tender and non-distended with normal bowel sounds appreciated on auscultation.  Extremities: There is no pitting edema in the distal lower extremities bilaterally. Pedal pulses are intact.  Skin: Warm and dry without trophic changes noted. There are no varicose veins.  Musculoskeletal: exam reveals no obvious joint deformities, tenderness or joint swelling or erythema.   Neurologically:  Mental status: The patient is awake, alert and oriented in all 4 spheres. Her memory, attention, language and knowledge are appropriate. There is no aphasia, agnosia, apraxia or anomia. Speech is clear with normal prosody and enunciation. Thought process is linear. Mood is congruent and affect is normal.  Cranial nerves are as described above under HEENT exam. In addition, shoulder shrug is normal with equal shoulder height noted. Motor exam: Normal bulk, strength and tone is noted. There is no drift, tremor or rebound. Romberg is negative. Reflexes are 2+ throughout. Toes are downgoing bilaterally. Fine motor skills are intact with normal finger taps, normal hand movements, normal rapid alternating patting, normal foot taps and normal foot agility.  Cerebellar testing shows no dysmetria or intention tremor on finger to nose testing. Heel to shin is unremarkable bilaterally. There is no truncal or gait ataxia.  Sensory exam is intact to light touch, pinprick, vibration, temperature sense and proprioception in the upper and lower extremities.  Gait, station and balance are unremarkable. No veering to one side is noted. No leaning to one side is noted. Posture is age-appropriate and stance is narrow based. No problems turning are noted. She turns en bloc. Tandem walk is unremarkable. Intact toe and heel stance is noted.                Assessment and Plan:   In summary, Brenna Friesenhahn is a very pleasant 47  y.o.-year old female with a history of overall mild obstructive sleep apnea, moderate by numbers and REM sleep, but severe daytime somnolence. She continues to endorse severe daytime somnolence with an ESS of 23/24 today. Her sleepiness stands out of proportion to the degree of sleep apnea. While it is possible to see this degree of sleepiness with this type of underlying OSA sense often the degree of sleepiness is not correlated with degree of severity of the underlying sleep apnea, I do believe that she may have a separate sleepiness disorder, perhaps even narcolepsy without cataplexy, based on her report of falling asleep very quickly, being very sleepy despite the use of high-dose caffeine, and her report of sleep paralysis. Alternatively she may also have idiopathic hypersomnolence in addition to somnolence with sleep apnea. At this juncture I would like to invite her back for a full night CPAP titration study to make sure she is on the right settings of her CPAP treatment but furthermore I would like for her to stay for a nap study the next day during which we will use CPAP as well during her naps. I think it will help [place] delineate her sleepiness disorder better. She is willing to proceed with further testing and I've asked her to followup with me after these are completed. We may proceed to using a prescription wake promoting agent such as Provigil, new visual or a stimulant down the Road.  I answered all her questions today and the patient was in agreement with the above outlined plan. I would like to see the patient back soon and she is encouraged to call with any interim questions, concerns, problems or updates.   Thank you very much for allowing me to participate in the care of this nice patient. If I can be of any further assistance to you please do not hesitate to call me at [phone].  Sincerely,   Huston Foley, MD, PhD

## 2013-03-09 ENCOUNTER — Ambulatory Visit: Payer: Self-pay | Admitting: Obstetrics and Gynecology

## 2013-03-09 ENCOUNTER — Ambulatory Visit (INDEPENDENT_AMBULATORY_CARE_PROVIDER_SITE_OTHER): Payer: 59 | Admitting: Obstetrics and Gynecology

## 2013-03-09 ENCOUNTER — Encounter: Payer: Self-pay | Admitting: Obstetrics and Gynecology

## 2013-03-09 VITALS — BP 110/62 | HR 80 | Resp 16 | Ht 66.0 in | Wt 145.0 lb

## 2013-03-09 DIAGNOSIS — Z23 Encounter for immunization: Secondary | ICD-10-CM

## 2013-03-09 DIAGNOSIS — Z01419 Encounter for gynecological examination (general) (routine) without abnormal findings: Secondary | ICD-10-CM

## 2013-03-09 MED ORDER — ESTROGENS CONJ SYNTHETIC B 1.25 MG PO TABS: 1.2500 mg | ORAL_TABLET | Freq: Every day | ORAL | Status: DC

## 2013-03-09 NOTE — Patient Instructions (Addendum)

## 2013-03-09 NOTE — Progress Notes (Signed)
Patient ID: [name] [name], female   DOB: 02-16-1966, 47 y.o.   MRN: [id number]  Goes by [name] [name] GYNECOLOGY VISIT  PCP:  Adrian Prince, MD  Referring provider: None  HPI: 47 y.o.   Divorced  [place]  female   [id number] with No LMP recorded. Patient has had a hysterectomy.   here for annual exam. History of CIN III treated with LEEP in 1993. Total vaginal hysterectomy 2004 - foci of high grade dysplasia.  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 Pap 09/08/12 WNL.  Enjuvia controls hot flashes well.  Likes that this does not confuse her as to signs of hypoglycemia.    Hgb:  Will have checked with Dr. Evlyn Kanner on Monday Urine:  Will have checked with Dr. Evlyn Kanner on Monday  GYNECOLOGIC HISTORY: No LMP recorded. Patient has had a hysterectomy. Sexually active:  yes Partner preference: female Contraception:   hysterectomy Menopausal hormone therapy: Enjuvia  DES exposure:   none Blood transfusions:   none Sexually transmitted diseases:  HSV II GYN Procedures:  Colposcopy, LEEP, Hysterectomy Mammogram: 06/20/12, WNL.  Extremely dense breast tissue noted.  Pap:   09/08/09, WNL, HPV not done History of abnormal pap smear:  yes   OB History   Grav Para Term Preterm Abortions TAB SAB Ect Mult Living   0 0 0 0 0 0 0 0 0 0        LIFESTYLE: Exercise:   occ walk and light cardio            Tobacco:  none Alcohol: none Drug use:  none  OTHER HEALTH MAINTENANCE: Tetanus/TDap:  2002 Gardisil: none Influenza:  Work yearly  Cholesterol check: 2013  Family History  Problem Relation Age of Onset  . Other Mother     cysts  . Diabetes Father   . Kidney disease Father   . Stroke Paternal Aunt     Patient Active Problem List   Diagnosis Date Noted  . HYPOTHYROIDISM 09/21/2007  . DIABETES MELLITUS, TYPE I 09/21/2007  . ANXIETY DEPRESSION 09/21/2007  . NAUSEA 09/21/2007   Past Medical History  Diagnosis Date  . Diabetes mellitus   . Stress   . Breast  cyst     left  . 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 Thyroid disease     hypothyroidism  . 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 VAIN I (vaginal intraepithelial neoplasia grade I) 2013  . Sleep apnea 2013  . Meningitis 06/2001    --hospital  . Ketoacidosis [id number],[id number]    Past Surgical History  Procedure Laterality Date  . Abdominal hysterectomy      2004  . Tonsillectomy and adenoidectomy    . Hand surgery  2004    tore tendon  . Soft tissue cyst excision  12/2010    left breast epidermoid inclusion cyst  . Cervical biopsy  w/ loop electrode excision  01-24-95    CIN I  . Colposcopy  11-21-02    CIN III  . Colposcopy  05-05-12    no lesions--needs repeat pap 08/2012 per Dr. Tresa Res    ALLERGIES: Nutritional supplements; Penicillins; and Sulfa antibiotics  Current Outpatient Prescriptions  Medication Sig Dispense Refill  . ALPRAZolam (XANAX) 0.5 MG tablet Take 0.25 mg by mouth as needed.       Marland Kitchen buPROPion (ZYBAN) 150 MG 12 hr tablet Take 450 mg by mouth daily.      Marland Kitchen escitalopram (LEXAPRO)  10 MG tablet Take 10 mg by mouth daily.        Marland Kitchen estrogens conjugated, synthetic B, (ENJUVIA) 1.25 MG tablet Take 1.25 mg by mouth daily.        . insulin lispro (HUMALOG) 100 UNIT/ML injection Inject 100 Units into the skin daily.      Marland Kitchen levothyroxine (SYNTHROID, LEVOTHROID) 112 MCG tablet Take 112 mcg by mouth daily.         No current facility-administered medications for this visit.     ROS:  Pertinent items are noted in HPI.  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  PHYSICAL EXAMINATION:    BP 110/62  Pulse 80  Resp 16  Ht 5\' 6"  (1.676 m)  Wt 145 lb (65.772 kg)  BMI 23.41 kg/m2   Wt Readings from Last 3 Encounters:  03/09/13 145 lb (65.772 kg)  02/23/13 147 lb (66.679 kg)     Ht Readings from Last 3 Encounters:  03/09/13 5\' 6"  (1.676 m)  02/23/13 5\' 6"  (1.676 m)    General appearance: alert, cooperative and appears stated  age Head: Normocephalic, without obvious abnormality, atraumatic Neck: no adenopathy, supple, symmetrical, trachea midline and thyroid not enlarged, symmetric, no tenderness/mass/nodules Lungs: clear to auscultation bilaterally Breasts: Inspection negative, No nipple retraction or dimpling, No nipple discharge or bleeding, No axillary or supraclavicular adenopathy, Normal to palpation without dominant masses Heart: regular rate and rhythm Abdomen: Blood sugar sensor attached to left upper abdomen.  Insulin pump attached to the left lower quadrant abdomen.  soft, non-tender; no masses,  no organomegaly Extremities: extremities normal, atraumatic, no cyanosis or edema Skin: Skin color, texture, turgor normal. No rashes or lesions Lymph nodes: Cervical, supraclavicular, and axillary nodes normal. No abnormal inguinal nodes palpated Neurologic: Grossly normal  Pelvic: External genitalia:  no lesions              Urethra:  normal appearing urethra with no masses, tenderness or lesions              Bartholins and Skenes: normal                 Vagina: normal appearing vagina with normal color and discharge, no lesions              Cervix:  absent              Pap - yes        Bimanual Exam:  Uterus:   absent                                      Adnexa: normal adnexa in size, nontender and no masses                                      Rectovaginal: Confirms                                      Anus:  normal sphincter tone, no lesions  ASSESSMENT  Normal gynecologic exam. History of CIN III and VAIN I. Menopausal female.  PLAN  Mammogram in December 2014.  I recommend 3D. Pap smear of vaginal cuff. Counseled on breast self exam, mammography screening TDap vaccine. Enjuvia Rx. Medications per Epic orders  Sees Dr. Evlyn Kanner for all labs. Return annually or prn   An After Visit Summary was printed and given to the patient.

## 2013-03-13 ENCOUNTER — Ambulatory Visit (INDEPENDENT_AMBULATORY_CARE_PROVIDER_SITE_OTHER): Payer: 59 | Admitting: Neurology

## 2013-03-13 DIAGNOSIS — G47419 Narcolepsy without cataplexy: Secondary | ICD-10-CM

## 2013-03-13 DIAGNOSIS — G4733 Obstructive sleep apnea (adult) (pediatric): Secondary | ICD-10-CM

## 2013-03-13 DIAGNOSIS — G4710 Hypersomnia, unspecified: Secondary | ICD-10-CM

## 2013-03-14 ENCOUNTER — Telehealth: Payer: Self-pay | Admitting: Obstetrics and Gynecology

## 2013-03-14 DIAGNOSIS — G4710 Hypersomnia, unspecified: Secondary | ICD-10-CM

## 2013-03-14 DIAGNOSIS — G4733 Obstructive sleep apnea (adult) (pediatric): Secondary | ICD-10-CM

## 2013-03-14 LAB — IPS PAP SMEAR ONLY

## 2013-03-14 NOTE — Telephone Encounter (Signed)
Left message on cell phone to return call for further triage.

## 2013-03-14 NOTE — Telephone Encounter (Signed)
Patient returned phone call. °

## 2013-03-14 NOTE — Telephone Encounter (Signed)
I agree with your plan.  If has recurrent bleeding, needs referral to GI.

## 2013-03-14 NOTE — Telephone Encounter (Signed)
Left message to return call to triage nurse.

## 2013-03-14 NOTE — Telephone Encounter (Signed)
Spoke with patient, she states that she had BM this morning and noticed light red blood on the tissue after wiping. No hx of hemorrhoids, no hx rectal bleed. States she just recently went to the bathroom again and did not have any further bleeding. Is currently being treated for UTI by PCP but patient feels that this blood was noted from the rectum. Advised patient to return call if persists or worsens, advised to return call to this office or pcp if develops fever, flank pain, chills, nausea, vomiting. Patient verbalized understanding.  Please advise if needs further instructions?    Last annual exam 03/09/13 with Dr. Edward Jolly.

## 2013-03-14 NOTE — Telephone Encounter (Signed)
Patient is woke up this morning and found blood in her stool. She is having a sleep study done today. Can we see her for this or does she need to make an appointment at another doctors office. Please advise.

## 2013-03-15 NOTE — Telephone Encounter (Signed)
Patient is agreeable to call back if s/s return. Patient states that she is feeling improved, but may have noticed some blood tinge in stool this morning, but not when wiping. Feels it may be related to foods that she ate last night. She states she will monitor and call pcp or follow up prn.

## 2013-03-15 NOTE — Addendum Note (Signed)
Addended by: Conley Simmonds on: 03/15/2013 01:24 PM   Modules accepted: Orders

## 2013-03-16 LAB — IPS HPV ON A LIQUID BASED SPECIMEN

## 2013-03-21 ENCOUNTER — Telehealth: Payer: Self-pay | Admitting: Neurology

## 2013-03-21 NOTE — Telephone Encounter (Signed)
Her recent sleep tests showed adequate treatment of her OSA with current CPAP settings - no need to change.  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 However, she may still have a clinical picture more in keeping with narcolepsy. I will discuss details with her during appt. pls arrange FU asap. We will have to talk about putting her on a wake promoting medication.

## 2013-03-22 ENCOUNTER — Telehealth: Payer: Self-pay | Admitting: Neurology

## 2013-03-22 ENCOUNTER — Telehealth: Payer: Self-pay

## 2013-03-22 ENCOUNTER — Encounter: Payer: Self-pay | Admitting: *Deleted

## 2013-03-22 NOTE — Telephone Encounter (Signed)
Message copied by Alphonsa Overall on Thu Mar 22, 2013  9:08 AM ------      Message from: Conley Simmonds      Created: Sun Mar 18, 2013  8:45 AM       Please inform the patient that her pap showed ASCUS and that her high risk HPV status is negative.      Her next pap is due in 1 year.       Pap recall - 08.            Her pap also showed some yeast.  If she is having any itching, she may do over the counter Monistat or Gynelotrimin. ------

## 2013-03-22 NOTE — Telephone Encounter (Signed)
Jasmine December scheduled this patient for 03/28/13 in office to follow up with Dr. Frances Furbish.

## 2013-03-22 NOTE — Telephone Encounter (Signed)
I called and spoke with the patient about her recent sleep study. I informed the patient that her recent sleep study didn't show any residual obstructive or central sleep disorder, but it does support the diagnosis of excessive daytime sleepiness. I informed the patient that Dr. Frances Furbish would like to have to comeback into the office to discuss in detail her sleep study. Patient is scheduled for Oct 1 9:00

## 2013-03-22 NOTE — Telephone Encounter (Signed)
Patient is returning Amandas call wants her to call her back.

## 2013-03-22 NOTE — Telephone Encounter (Signed)
Patient notified of pap results.

## 2013-03-22 NOTE — Telephone Encounter (Signed)
LMOVM to call for pap results.

## 2013-03-28 ENCOUNTER — Encounter: Payer: Self-pay | Admitting: Neurology

## 2013-03-28 ENCOUNTER — Ambulatory Visit (INDEPENDENT_AMBULATORY_CARE_PROVIDER_SITE_OTHER): Payer: 59 | Admitting: Neurology

## 2013-03-28 VITALS — BP 104/68 | HR 92 | Temp 98.2°F | Ht 66.0 in | Wt 149.0 lb

## 2013-03-28 DIAGNOSIS — G4733 Obstructive sleep apnea (adult) (pediatric): Secondary | ICD-10-CM

## 2013-03-28 DIAGNOSIS — G4710 Hypersomnia, unspecified: Secondary | ICD-10-CM

## 2013-03-28 DIAGNOSIS — G4711 Idiopathic hypersomnia with long sleep time: Secondary | ICD-10-CM

## 2013-03-28 MED ORDER — ARMODAFINIL 150 MG PO TABS: 150.0000 mg | ORAL_TABLET | Freq: Every day | ORAL | Status: DC

## 2013-03-28 NOTE — Progress Notes (Signed)
Subjective:    Patient ID: [name] [name] is a 47 y.o. female.  HPI  Interim history:  [name] [name] is a very pleasant 47 year old Right handed female, with an underlying history of hypothyroidism, DM type I, anxiety and depression and OSA on CPAP, who presents for followup consultation of her excessive daytime somnolence. I first met her on 02/23/2013 at which time she presented for re-evaluation of her OSA and associated residual sleepiness. She was diagnosed with OSA in 2012 and started using CPAP in early 2013, and initially felt improved. She indicated full compliance with CPAP and I reviewed compliance data from her machine at the time of her first appointment with me. She was wondering if her CPAP pressure needed to be increased. She has been experiencing significant daytime somnolence in the last few months. Based on her significant degree of daytime somnolence I asked her to come back for a nighttime sleep study with CPAP, followed by a nap study during the day. I talked to her about her sleep test results in detail today. Her nocturnal sleep study showed a sleep efficiency at 78.3% with a latency to sleep of 30.5 minutes and wake after sleep onset of 82 minutes with moderate to severe sleep fragmentation noted. She had fairly normal arousal index at 7.3 per hour due primarily to spontaneous arousals. She had an increased percentage of stage I and stage II sleep at 11.2 and 67% respectively, absence of slow-wave sleep and a normal percentage of REM sleep at 21.8 with a prolonged REM latency of to 15.5 minutes. She had no significant period leg movements of sleep. CPAP was used throughout the study starting at 4 cm to 6 cm and eventually 9 cm which is her current treatment pressure. There was no need to increase her pressure further. She had a total of 1 obstructive and one central apneas as well as one obstructive hypopnea with an overall AHI of 0.4 per hour. On the final setting of 9 cm she had an  AHI of 0.5 per hour with supine REM sleep achieved. Her baseline oxygen saturation was 96%, her nadir was 92%. She proceeded to have MSLT the next day and fell asleep in all 5 naps with a mean sleep latency of 5.1 minutes and no sleep onset REM periods. She had reported episodes of sleep paralysis. Is also noteworthy that the patient is taking Lexapro which can be a REM suppressant, being an SSRI-type medication. She states that she had reduced her caffeine a few days before her sleep testing and had not had any caffeine for 2 days prior. Typically on the weekends she does not drink or take as much caffeine. She has slept for 11 or 12 hours and weekends. She states that she may sleep for more than 10 hours on an average night if she were left to her own devices. She has no new complaints. She continues to be very sleepy. Her Epworth sleepiness score is 24/24 today.    Her Past Medical History Is Significant For: Past Medical History  Diagnosis Date  . Diabetes mellitus   . Stress   . Breast cyst     left  . 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 Thyroid disease     hypothyroidism  . 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 VAIN I (vaginal intraepithelial neoplasia grade I) 2013  . Sleep apnea 2013  . Meningitis 06/2001    --hospital  . Ketoacidosis [id number],[id number]  Her Past Surgical History Is Significant For: Past Surgical History  Procedure Laterality Date  . Abdominal hysterectomy      2004  . Tonsillectomy and adenoidectomy    . Hand surgery  2004    tore tendon  . Soft tissue cyst excision  12/2010    left breast epidermoid inclusion cyst  . Cervical biopsy  w/ loop electrode excision  01-24-95    CIN I  . Colposcopy  11-21-02    CIN III  . Colposcopy  05-05-12    no lesions--needs repeat pap 08/2012 per Dr. Tresa Res    Her Family History Is Significant For: Family History  Problem Relation Age of Onset  . Other Mother     cysts  . Diabetes Father    . Kidney disease Father   . Stroke Paternal Aunt     Her Social History Is Significant For: History   Social History  . Marital Status: Divorced    Spouse Name: N/A    Number of Children: 0  . 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 Smoking status: Never Smoker   . Smokeless tobacco: Never Used  . Alcohol Use: 1.2 oz/week    2 Glasses of wine per week  . Drug Use: No  . Sexual Activity: Yes    Birth Control/ Protection: Surgical     Comment: [facility]   Other Topics Concern  . None   Social History Narrative  . None    Her Allergies Are:  Allergies  Allergen Reactions  . Nutritional Supplements   . Penicillins   . Sulfa Antibiotics   :   Her Current Medications Are:  Outpatient Encounter Prescriptions as of 03/28/2013  Medication Sig Dispense Refill  . ALPRAZolam (XANAX) 0.5 MG tablet Take 0.25 mg by mouth as needed.       Marland Kitchen buPROPion (WELLBUTRIN SR) 150 MG 12 hr tablet Take 3 tablets by mouth daily.      Marland Kitchen escitalopram (LEXAPRO) 10 MG tablet Take 10 mg by mouth daily.        Marland Kitchen estrogens conjugated, synthetic B, (ENJUVIA) 1.25 MG tablet Take 1 tablet (1.25 mg total) by mouth daily.  30 tablet  11  . insulin lispro (HUMALOG) 100 UNIT/ML injection Inject 25 Units into the skin daily.       Marland Kitchen levothyroxine (SYNTHROID, LEVOTHROID) 112 MCG tablet Take 112 mcg by mouth daily.        . ONE TOUCH ULTRA TEST test strip       . [DISCONTINUED] buPROPion (ZYBAN) 150 MG 12 hr tablet Take 450 mg by mouth daily.       No facility-administered encounter medications on file as of 03/28/2013.    Review of Systems  Constitutional: Positive for fatigue.  Psychiatric/Behavioral: Positive for sleep disturbance.    Objective:  Neurologic Exam  Physical Exam Physical Examination:   Filed Vitals:   03/28/13 0911  BP: 104/68  Pulse: 92  Temp: 98.2 F (36.8 C)    General Examination: The patient is a very pleasant  47 y.o. female in no acute distress. She appears well-developed and well-nourished and very well groomed.   HEENT: Normocephalic, atraumatic, pupils are equal, round and reactive to light and accommodation. Extraocular tracking is good without limitation to gaze excursion or nystagmus noted. Normal smooth pursuit is noted. Hearing is grossly intact. Face is symmetric with normal facial animation and normal facial sensation.  Speech is clear with no dysarthria noted. There is no hypophonia. There is no lip, neck/head, jaw or voice tremor. Neck is supple with full range of passive and active motion. There are no carotid bruits on auscultation. Oropharynx exam reveals: mild mouth dryness, good dental hygiene and mild airway crowding, due to narrow airway. Mallampati is class II. Tongue protrudes centrally and palate elevates symmetrically. Tonsils are absent.   Chest: Clear to auscultation without wheezing, rhonchi or crackles noted.  Heart: S1+S2+0, regular and normal without murmurs, rubs or gallops noted.   Abdomen: Soft, non-tender and non-distended with normal bowel sounds appreciated on auscultation.  Extremities: There is no pitting edema in the distal lower extremities bilaterally. Pedal pulses are intact.  Skin: Warm and dry without trophic changes noted. There are no varicose veins.  Musculoskeletal: exam reveals no obvious joint deformities, tenderness or joint swelling or erythema.   Neurologically:  Mental status: The patient is awake, alert and oriented in all 4 spheres. Her memory, attention, language and knowledge are appropriate. There is no aphasia, agnosia, apraxia or anomia. Speech is clear with normal prosody and enunciation. Thought process is linear. Mood is congruent and affect is normal.  Cranial nerves are as described above under HEENT exam. In addition, shoulder shrug is normal with equal shoulder height noted. Motor exam: Normal bulk, strength and tone is noted. There is  no drift, tremor or rebound. Romberg is negative. Reflexes are 2+ throughout. Toes are downgoing bilaterally. Fine motor skills are intact with normal finger taps, normal hand movements, normal rapid alternating patting, normal foot taps and normal foot agility.  Cerebellar testing shows no dysmetria or intention tremor on finger to nose testing. Heel to shin is unremarkable bilaterally. There is no truncal or gait ataxia.  Sensory exam is intact to light touch, pinprick, vibration, and temperature sense in the upper and lower extremities.  Gait, station and balance are unremarkable. No veering to one side is noted. No leaning to one side is noted. Posture is age-appropriate and stance is narrow based. No problems turning are noted. She turns en bloc. Intact toe and heel stance is noted.               Most of our 45 minute visit today was spent in counseling and coordination of care, reviewing test results and reviewing medication options.  Assessment and Plan:   In summary, Aynslee Mulhall is a very pleasant 55 -year old female with a history of obstructive sleep apnea well treated with CPAP of 9 cm. She has a history of severe daytime somnolence which has been progressive in the last few months. Her sleepiness is out of proportion to the degree of her underlying sleep apnea. She recently had repeat sleep study testing with a nap testing. Her nighttime sleep study confirmed her CPAP pressure to be appropriate at 9 cm. 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 Confounding factors are the fact that she is still taking some psychotropic medications, including a REM suppressant medication, occasional benzodiazepine, and excessive caffeine which she reduced quite abruptly prior to the sleep testing. Nevertheless, I do believe she has a significant sleepiness disorder. Her history suggests long sleep time at night as well. I don't think that narcolepsy can be completely excluded at this  moment. While she does not endorse cataplexy she has had sleep paralysis episodes. At this juncture I would like for her to get treated with symptomatic treatment of her hypersomnolence. To that end we talked  at length about different options including stimulant and non-stimulant type medications and her benefits, side effects, expectations and limitations. I suggested a trial of Nuvigil 150 mg strength once daily. I provided her with 7 days worth of samples as well as a prescription for a free 30 day supply. She also was given a co-pay card to help with anxiety flareups, blood pressure increase. I would like for her to call us back in about a month to report how she is doing, at which point I will provide her with a prescription for Nuvigil month to month. I answered all her questions today and the patient was in agreement with the above outlined plan. I would like to see the patient back in about 3 months, sooner if the need arises and she is encouraged to call with any interim questions, concerns, problems or updates.

## 2013-03-28 NOTE — Patient Instructions (Addendum)
To have significant daytime sleepiness despite being well treated for years sleep apnea with CPAP. I would like for you to continue using CPAP regularly. While your insurance requires that you use CPAP at least 4 hours each night on 70% of the nights, I recommend, that you not skip any nights and use it throughout the night if you can. Getting used to CPAP does take time and patience and discipline. Untreated obstructive sleep apnea when it is moderate to severe can have an adverse impact on cardiovascular health and raise her risk for heart disease, arrhythmias, hypertension, congestive heart failure, stroke and diabetes. Untreated obstructive sleep apnea causes sleep disruption, nonrestorative sleep, and sleep deprivation. This can have an impact on your day to day functioning and cause daytime sleepiness and impairment of cognitive function, memory loss, mood disturbance, and problems focussing. Using CPAP regularly can improve these symptoms.  In addition, I believe you have a condition called idiopathic hypersomnolence. This means, that you have a sleep disorder that manifests with excessive sleep and excessive sleepiness during the day. We may have to try different medications that may help you stay awake during the day. Not everything works with everybody the same way. Wake promoting agents include stimulants and non-stimulant type medications. The most common side effects with stimulants are weight loss, insomnia, nervousness, headaches, palpitations, rise in blood pressure, anxiety. Stimulants can be addictive and subject to abuse. Non-stimulant type wake promoting medications include Provigil and Nuvigil, most common side effects include headaches, nervousness, insomnia, hypertension.  Try to reduce your caffeine as you start the Nuvigil. Call in one month to report back and we will provide a month to month prescription for Nuvigil, you can use the co-pay card.

## 2013-04-02 ENCOUNTER — Telehealth: Payer: Self-pay | Admitting: Neurology

## 2013-04-02 NOTE — Telephone Encounter (Signed)
Please ask her to fill the prescription I gave her during the last visit for the first 30 days to be free. Please ask her to report back in another week or 2. We may decide to increase the dose to 250 mg once daily soon, thx sa

## 2013-04-03 ENCOUNTER — Other Ambulatory Visit: Payer: Self-pay | Admitting: Neurology

## 2013-04-03 DIAGNOSIS — G4733 Obstructive sleep apnea (adult) (pediatric): Secondary | ICD-10-CM

## 2013-04-03 DIAGNOSIS — G4711 Idiopathic hypersomnia with long sleep time: Secondary | ICD-10-CM

## 2013-04-03 MED ORDER — ARMODAFINIL 150 MG PO TABS: 150.0000 mg | ORAL_TABLET | Freq: Every day | ORAL | Status: DC

## 2013-04-04 NOTE — Telephone Encounter (Signed)
Called patient left voice mail asking her to call us back.

## 2013-04-06 NOTE — Telephone Encounter (Signed)
I called pt back after she LM that she had misplaced the card for her medication, Nuvigil.   She has taken samples and our for 3 days now.  The medication that she is getting at [facility] [phone] is requiring PA.  I called the pharmacy they gave me #  [id number], and ID# is [id number].  I called and LMVM for pt that I would place  Samples and card up front.  Relayed that Shanda Bumps in pharmacy to be back next Monday.  Will be placed in her stack.

## 2013-04-16 ENCOUNTER — Telehealth: Payer: Self-pay | Admitting: *Deleted

## 2013-04-16 ENCOUNTER — Other Ambulatory Visit: Payer: Self-pay

## 2013-04-16 DIAGNOSIS — G4733 Obstructive sleep apnea (adult) (pediatric): Secondary | ICD-10-CM

## 2013-04-16 DIAGNOSIS — G4711 Idiopathic hypersomnia with long sleep time: Secondary | ICD-10-CM

## 2013-04-16 MED ORDER — ARMODAFINIL 250 MG PO TABS: 250.0000 mg | ORAL_TABLET | Freq: Every day | ORAL | Status: DC

## 2013-04-16 NOTE — Telephone Encounter (Signed)
Pt called on Friday at 1514 and relayed that she has run out of samples and wanted Dr. Frances Furbish to know that would not take card with insurance.  Pt stated that the 150mg  nuvigil has not worked for her, still sleepin ghtru several alarms.  Has decreased caffeine by half also.  She would like to try the 250mg  Nuvigil and would get samples at lunch today (1300-1400).  Is this ok??

## 2013-04-16 NOTE — Telephone Encounter (Signed)
I put in Rx for Nuvigil 250 mg. Okay to give samples. Where are we with the status on the prior auth?

## 2013-04-16 NOTE — Progress Notes (Signed)
I called and spoke to pt and relayed after consulting Dr. Frances Furbish, that samples of Nuvigil 250mg  po daily up front (3 wks worth).  Pick up today.  New order placed for this dosage.  PA will be required as was for 150mg  dose.  Pt made aware.

## 2013-04-17 MED ORDER — ARMODAFINIL 250 MG PO TABS: 250.0000 mg | ORAL_TABLET | Freq: Every day | ORAL | Status: DC

## 2013-04-23 MED ORDER — AMPHETAMINE-DEXTROAMPHET ER 10 MG PO CP24: 10.0000 mg | ORAL_CAPSULE | ORAL | Status: DC

## 2013-04-23 NOTE — Telephone Encounter (Signed)
Please ask patient to continue with Nuvigil at the current dose and I would like for her to add Adderall XR which is a stimulant. It can cause side effects including appetite loss, jitteriness, nervousness, headaches, feeling wired. It can also cause her to have increase in blood pressure. It is a controlled substance. We will fax a prescription to her pharmacy.

## 2013-04-23 NOTE — Telephone Encounter (Signed)
Pt called back and is letting us know that the nuvigil 250mg  po daily has not made a difference for her, along with no caffeine.  What next?  Does she need to continue on this for a while?

## 2013-04-24 MED ORDER — ARMODAFINIL 250 MG PO TABS: 250.0000 mg | ORAL_TABLET | Freq: Every day | ORAL | Status: DC

## 2013-04-24 NOTE — Telephone Encounter (Signed)
There should not be any interaction with her antidepressant or with taking Xanax. Nuvigil Rx is done.

## 2013-04-24 NOTE — Telephone Encounter (Signed)
Shared information w/patient per VM message as stated below per Dr Frances Furbish

## 2013-04-24 NOTE — Telephone Encounter (Signed)
Spoke to patient and told her she needed to pick up the Adderall XR prescription.  She wanted to know if it would cause an interaction with her xanax, which she takes occasionally, or her antidepressants.    She also wanted it documented that she will be away for 2 weeks at Thanksgiving and will need to have a new prescription for Nuvigil.

## 2013-04-25 ENCOUNTER — Telehealth: Payer: Self-pay | Admitting: *Deleted

## 2013-04-27 NOTE — Telephone Encounter (Signed)
I called pt and she is asking about taking adderall prior to having a colonscopy (twice) due to constipation.  I told her that everyone is affected differently by medications.  She may take this and have no problem at all.   She may want to hold off until after having the colonscopy.  She may call then and ask there advice.  She verbalized understanding.  She relayed that she p/u adderall prescription and then received a call back stating that she could p/u or it could mailed.  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   May have been crossover.  I would relay to [place].

## 2013-04-27 NOTE — Telephone Encounter (Signed)
I called patient and left VM that she can call back and ask for me if she does find that she is having any problems. Again, she may not have any problem at all.

## 2013-05-09 ENCOUNTER — Telehealth: Payer: Self-pay | Admitting: Neurology

## 2013-05-15 ENCOUNTER — Telehealth: Payer: Self-pay | Admitting: Neurology

## 2013-05-15 DIAGNOSIS — G4733 Obstructive sleep apnea (adult) (pediatric): Secondary | ICD-10-CM

## 2013-05-15 DIAGNOSIS — G4711 Idiopathic hypersomnia with long sleep time: Secondary | ICD-10-CM

## 2013-05-16 MED ORDER — ARMODAFINIL 250 MG PO TABS: 250.0000 mg | ORAL_TABLET | Freq: Every day | ORAL | Status: DC

## 2013-05-16 MED ORDER — AMPHETAMINE-DEXTROAMPHET ER 15 MG PO CP24: 15.0000 mg | ORAL_CAPSULE | ORAL | Status: DC

## 2013-05-16 NOTE — Telephone Encounter (Signed)
I would like to suggest increasing the Adderall XR to 15 mg once daily. Please ask her to continue with Nuvigil for now until next appointment. Please advise her that her prescriptions are ready for pickup.

## 2013-05-16 NOTE — Telephone Encounter (Signed)
I called pt back and relayed prescriptions ready for her.  Each pt different in how the medication works for them.  She can try and take both medications in the am, and see how she does.   May try and stagger meds, (adderall at 1000, no later then 1200) and the nuvigil can be taken in am.  PA [id number] 05-13-13 thru 05-14-14.  She will come by and p/u prescriptions.  Placed up front.

## 2013-05-16 NOTE — Telephone Encounter (Signed)
I called pt and she ran out of nuvigil, this is 8 days now.   She has not noticed a difference at the 250mg  po level.  Although she has noticed the adderall xr helping in her job, due to focus, on task, getting job done.  She is leaving for a trip Sunday and return on Dec 6.  Needs prescription for adderall, for she will run out on trip.  Please address.

## 2013-05-16 NOTE — Telephone Encounter (Signed)
See later note

## 2013-06-19 ENCOUNTER — Other Ambulatory Visit: Payer: Self-pay | Admitting: Neurology

## 2013-06-19 DIAGNOSIS — G4711 Idiopathic hypersomnia with long sleep time: Secondary | ICD-10-CM

## 2013-06-19 DIAGNOSIS — G4733 Obstructive sleep apnea (adult) (pediatric): Secondary | ICD-10-CM

## 2013-06-19 MED ORDER — AMPHETAMINE-DEXTROAMPHET ER 15 MG PO CP24: 15.0000 mg | ORAL_CAPSULE | ORAL | Status: DC

## 2013-06-19 NOTE — Telephone Encounter (Signed)
Patient called to request Adderall refill, states that she wants to pick up the refill tomorrow. Patient also requests a call when this has been done. Please call.

## 2013-06-20 ENCOUNTER — Telehealth: Payer: Self-pay | Admitting: Neurology

## 2013-06-20 NOTE — Telephone Encounter (Signed)
Prescription is ready for pick up, I called and informed the patient.

## 2013-06-27 ENCOUNTER — Ambulatory Visit: Payer: 59

## 2013-07-24 ENCOUNTER — Other Ambulatory Visit: Payer: Self-pay | Admitting: Neurology

## 2013-07-24 ENCOUNTER — Telehealth: Payer: Self-pay | Admitting: Neurology

## 2013-07-24 DIAGNOSIS — G4711 Idiopathic hypersomnia with long sleep time: Secondary | ICD-10-CM

## 2013-07-24 DIAGNOSIS — G4733 Obstructive sleep apnea (adult) (pediatric): Secondary | ICD-10-CM

## 2013-07-24 DIAGNOSIS — Z9989 Dependence on other enabling machines and devices: Secondary | ICD-10-CM

## 2013-07-24 MED ORDER — AMPHETAMINE-DEXTROAMPHET ER 15 MG PO CP24: 15.0000 mg | ORAL_CAPSULE | ORAL | Status: DC

## 2013-07-24 NOTE — Telephone Encounter (Signed)
Patient came to the office to pick up her Rx today.

## 2013-07-24 NOTE — Telephone Encounter (Signed)
During appointment reminder call PT stated that she needed her Adderall prescription refilled.  She took her last pill yesterday.  Her appointment is scheduled for 1/29.  Please call to let her know when it will be ready for pick up.  Thank you

## 2013-07-26 ENCOUNTER — Encounter: Payer: Self-pay | Admitting: Neurology

## 2013-07-26 ENCOUNTER — Encounter (INDEPENDENT_AMBULATORY_CARE_PROVIDER_SITE_OTHER): Payer: Self-pay

## 2013-07-26 ENCOUNTER — Ambulatory Visit (INDEPENDENT_AMBULATORY_CARE_PROVIDER_SITE_OTHER): Payer: 59 | Admitting: Neurology

## 2013-07-26 VITALS — BP 130/85 | HR 90 | Temp 97.4°F | Ht 66.0 in | Wt 142.0 lb

## 2013-07-26 DIAGNOSIS — G4710 Hypersomnia, unspecified: Secondary | ICD-10-CM

## 2013-07-26 DIAGNOSIS — G4733 Obstructive sleep apnea (adult) (pediatric): Secondary | ICD-10-CM

## 2013-07-26 DIAGNOSIS — G4711 Idiopathic hypersomnia with long sleep time: Secondary | ICD-10-CM

## 2013-07-26 DIAGNOSIS — Z9989 Dependence on other enabling machines and devices: Secondary | ICD-10-CM

## 2013-07-26 MED ORDER — ARMODAFINIL 250 MG PO TABS: 250.0000 mg | ORAL_TABLET | Freq: Every day | ORAL | Status: DC

## 2013-07-26 MED ORDER — AMPHETAMINE-DEXTROAMPHET ER 15 MG PO CP24: 15.0000 mg | ORAL_CAPSULE | ORAL | Status: DC

## 2013-07-26 NOTE — Progress Notes (Signed)
Subjective:    Patient ID: [name] [name] is a 48 y.o. female.  HPI    Interim history:    Ms. Hickmon is a very pleasant 48 year old Right handed female, with an underlying history of hypothyroidism, DM type I, anxiety and depression and OSA on CPAP, who presents for followup consultation of her excessive daytime somnolence. I last saw her on 03/28/2013, at which time I suggested she continue with CPAP at 9 cm of water pressure. I felt that her daytime somnolence was out of proportion to the degree of her underlying sleep apnea. 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 However, confounding factors were her taking psychotropic medications including of REM suppressant medication, occasional benzodiazepine medication and she was also using caffeine excessively which he reduced quite abruptly before the sleep testing. I did not think she had narcolepsy. While she did not endorse cataplexy, she had had some sleep paralysis. I suggested a trial of Nuvigil starting at 150 mg strength. There was a delay in getting prior authorization and her insurance would not take a co-pay card to help her out with samples. We also increased in the interim her Nuvigil dose to 250 mg. She also called in the interim and we added Adderall XR 15 mg strength. She has been tolerating this well and she reports improvement in her focus, and daytime somnolence with the addition of Adderall XR.   Today, she reports that she feels better on the increase dose of Adderall XR, but she has a hard time getting refills from [place] for her Adderall XR.   I first met her on 02/23/2013 at which time she presented for re-evaluation of her OSA and associated residual sleepiness. She was diagnosed with OSA in 2012 and started using CPAP in early 2013, and initially felt improved. She indicated full compliance with CPAP and I reviewed compliance data from her machine  at the time of her first appointment with me. She was wondering if her CPAP pressure needed to be increased. She has been experiencing significant daytime somnolence in the last few months. Based on her significant degree of daytime somnolence I asked her to come back for a nighttime sleep study with CPAP, followed by a nap study during the day. I talked to her about her sleep test results in detail today. Her nocturnal sleep study showed a sleep efficiency at 78.3% with a latency to sleep of 30.5 minutes and wake after sleep onset of 82 minutes with moderate to severe sleep fragmentation noted. She had fairly normal arousal index at 7.3 per hour due primarily to spontaneous arousals. She had an increased percentage of stage I and stage II sleep at 11.2 and 67% respectively, absence of slow-wave sleep and a normal percentage of REM sleep at 21.8 with a prolonged REM latency of to 15.5 minutes. She had no significant period leg movements of sleep. CPAP was used throughout the study starting at 4 cm to 6 cm and eventually 9 cm which is her current treatment pressure. There was no need to increase her pressure further. She had a total of 1 obstructive and one central apneas as well as one obstructive hypopnea with an overall AHI of 0.4 per hour. On the final setting of 9 cm she had an AHI of 0.5 per hour with supine REM sleep achieved. Her baseline oxygen saturation was 96%, her nadir was 92%. She proceeded to have MSLT the next day  and fell asleep in all 5 naps with a mean sleep latency of 5.1 minutes and no sleep onset REM periods. She had reported episodes of sleep paralysis. Is also noteworthy that the patient is taking Lexapro which can be a REM suppressant, being an SSRI-type medication. She reduced her caffeine intake. She has slept for 11 or 12 hours on weekends. She states that she may sleep for more than 10 hours on an average night if she were left to her own devices.   Her Past Medical History Is  Significant For: Past Medical History  Diagnosis Date  . Diabetes mellitus   . Stress   . Breast cyst     left  . 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 Thyroid disease     hypothyroidism  . 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 VAIN I (vaginal intraepithelial neoplasia grade I) 2013  . Sleep apnea 2013  . Meningitis 06/2001    --hospital  . Ketoacidosis 7371,[id number]    Her Past Surgical History Is Significant For: Past Surgical History  Procedure Laterality Date  . Abdominal hysterectomy      2004  . Tonsillectomy and adenoidectomy    . Hand surgery  2004    tore tendon  . Soft tissue cyst excision  12/2010    left breast epidermoid inclusion cyst  . Cervical biopsy  w/ loop electrode excision  01-24-95    CIN I  . Colposcopy  11-21-02    CIN III  . Colposcopy  05-05-12    no lesions--needs repeat pap 08/2012 per Dr. Joan Flores    Her Family History Is Significant For: Family History  Problem Relation Age of Onset  . Other Mother     cysts  . Diabetes Father   . Kidney disease Father   . Stroke Paternal Aunt     Her Social History Is Significant For: History   Social History  . Marital Status: Divorced    Spouse Name: N/A    Number of Children: 0  . 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 Smoking status: Never Smoker   . Smokeless tobacco: Never Used  . Alcohol Use: 1.2 oz/week    2 Glasses of wine per week  . Drug Use: No  . Sexual Activity: Yes    Birth Control/ Protection: Surgical     Comment: [facility]   Other Topics Concern  . None   Social History Narrative  . None    Her Allergies Are:  Allergies  Allergen Reactions  . Penicillins   . Sulfa Antibiotics   :   Her Current Medications Are:  Outpatient Encounter Prescriptions as of 07/26/2013  Medication Sig  . ALPRAZolam (XANAX) 0.5 MG tablet Take 0.25 mg by mouth as needed.   Marland Kitchen  amphetamine-dextroamphetamine (ADDERALL XR) 15 MG 24 hr capsule Take 1 capsule (15 mg total) by mouth every morning.  . Armodafinil 250 MG tablet Take 1 tablet (250 mg total) by mouth daily.  Marland Kitchen buPROPion (WELLBUTRIN SR) 150 MG 12 hr tablet Take 3 tablets by mouth daily.  Marland Kitchen escitalopram (LEXAPRO) 10 MG tablet Take 10 mg by mouth daily.    Marland Kitchen estrogens conjugated, synthetic B, (ENJUVIA) 1.25 MG tablet Take 1 tablet (1.25 mg total) by mouth daily.  . insulin lispro (HUMALOG) 100 UNIT/ML injection Inject 25 Units into the skin daily.   Marland Kitchen  levothyroxine (SYNTHROID, LEVOTHROID) 112 MCG tablet Take 112 mcg by mouth daily.    . ONE TOUCH ULTRA TEST test strip   :  Review of Systems:  Out of a complete 14 point review of systems, all are reviewed and negative with the exception of these symptoms as listed below:   Review of Systems  Constitutional: Negative.   HENT: Negative.   Eyes: Negative.   Respiratory: Negative.   Cardiovascular: Negative.   Gastrointestinal: Negative.   Endocrine: Negative.   Genitourinary: Negative.   Musculoskeletal: Negative.   Skin: Negative.   Allergic/Immunologic: Negative.   Neurological: Negative.   Hematological: Negative.   Psychiatric/Behavioral: Negative.   All other systems reviewed and are negative.    Objective:  Neurologic Exam  Physical Exam Physical Examination:   Filed Vitals:   07/26/13 1543  BP: 130/85  Pulse: 90  Temp: 97.4 F (36.3 C)    General Examination: The patient is a very pleasant 48 y.o. female in no acute distress. She appears well-developed and well-nourished and very well groomed.   HEENT: Normocephalic, atraumatic, pupils are equal, round and reactive to light and accommodation. Funduscopy is normal . Extraocular tracking is good without limitation to gaze excursion or nystagmus noted. Normal smooth pursuit is noted. Hearing is grossly intact. Face is symmetric with normal facial animation and normal facial sensation.  Speech is clear with no dysarthria noted. There is no hypophonia. There is no lip, neck/head, jaw or voice tremor. Neck is supple with full range of passive and active motion. There are no carotid bruits on auscultation. Oropharynx exam reveals: mild mouth dryness, good dental hygiene and mild airway crowding, due to narrow airway. Mallampati is class II. Tongue protrudes centrally and palate elevates symmetrically. Tonsils are absent.   Chest: Clear to auscultation without wheezing, rhonchi or crackles noted.  Heart: S1+S2+0, regular and normal without murmurs, rubs or gallops noted.   Abdomen: Soft, non-tender and non-distended with normal bowel sounds appreciated on auscultation.  Extremities: There is no pitting edema in the distal lower extremities bilaterally. Pedal pulses are intact.  Skin: Warm and dry without trophic changes noted. There are no varicose veins.  Musculoskeletal: exam reveals no obvious joint deformities, tenderness or joint swelling or erythema.   Neurologically:  Mental status: The patient is awake, alert and oriented in all 4 spheres. Her memory, attention, language and knowledge are appropriate. There is no aphasia, agnosia, apraxia or anomia. Speech is clear with normal prosody and enunciation. Thought process is linear. Mood is congruent and affect is normal.  Cranial nerves are as described above under HEENT exam. In addition, shoulder shrug is normal with equal shoulder height noted. Motor exam: Normal bulk, strength and tone is noted. There is no drift, tremor or rebound. Romberg is negative. Reflexes are 2+ throughout. Toes are downgoing bilaterally. Fine motor skills are intact with normal finger taps, normal hand movements, normal rapid alternating patting, normal foot taps and normal foot agility.  Cerebellar testing shows no dysmetria or intention tremor on finger to nose testing. There is no truncal or gait ataxia.  Sensory exam is intact to light touch,  pinprick, vibration, and temperature sense in the upper and lower extremities.  Gait, station and balance are unremarkable. No veering to one side is noted. No leaning to one side is noted. Posture is age-appropriate and stance is narrow based. No problems turning are noted. She turns en bloc. Intact toe and heel stance is noted.  Assessment and Plan:   In summary, Prim Morace is a very pleasant 65 -year old female with a history of obstructive sleep apnea well treated with CPAP of 9 cm. She has a history of severe daytime somnolence which has been progressive in the recent past. Her sleepiness is out of proportion to the degree of her underlying sleep apnea. Recent repeat sleep study testing and MSLT confirmed her CPAP pressure to be appropriate at 9 cm. Her daytime nap test was consistent with idiopathic hypersomnolence. Confounding factors, however, were the fact that she was taking some psychotropic medications, including a REM suppressant medication, occasional benzodiazepine, and excessive caffeine which she has now reduced. Nevertheless, I do believe she has a significant sleepiness disorder. Her history suggests long sleep time at night as well. 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 While she does not endorse cataplexy she has had sleep paralysis episodes. At this juncture, she is advised to continue with Nuvigil 250 mg strength once daily and Adderall XR 15 mg once daily. She can stagger these 2 medications. I provided her with a refill on Nuvigil as well as a new prescription for Adderall XR. She just filled her prescription yesterday, so she is going to have to wait for her next pick up until end of February. I answered all her questions today and the patient was in agreement with the above outlined plan. I would like to see the patient back in about 3 months, sooner if the need arises and she is  encouraged to call with any interim questions, concerns, problems or updates.

## 2013-07-26 NOTE — Patient Instructions (Addendum)
Please continue using your CPAP regularly. While your insurance requires that you use CPAP at least 4 hours each night on 70% of the nights, I recommend, that you not skip any nights and use it throughout the night if you can. Getting used to CPAP does take time and patience and discipline. Untreated obstructive sleep apnea when it is moderate to severe can have an adverse impact on cardiovascular health and raise her risk for heart disease, arrhythmias, hypertension, congestive heart failure, stroke and diabetes. Untreated obstructive sleep apnea causes sleep disruption, nonrestorative sleep, and sleep deprivation. This can have an impact on your day to day functioning and cause daytime sleepiness and impairment of cognitive function, memory loss, mood disturbance, and problems focussing. Using CPAP regularly can improve these symptoms.  Continue Nuvigil and Adderall XR, you may want to try Nuvigil first thing in the morning and Adderall XR at 10 AM.   Follow up in 3 months.

## 2013-09-28 ENCOUNTER — Other Ambulatory Visit: Payer: Self-pay | Admitting: Neurology

## 2013-09-28 DIAGNOSIS — Z9989 Dependence on other enabling machines and devices: Secondary | ICD-10-CM

## 2013-09-28 DIAGNOSIS — G4733 Obstructive sleep apnea (adult) (pediatric): Secondary | ICD-10-CM

## 2013-09-28 DIAGNOSIS — G4711 Idiopathic hypersomnia with long sleep time: Secondary | ICD-10-CM

## 2013-09-28 NOTE — Telephone Encounter (Signed)
Patient calling to request paper script refill of Adderall, states she took her last pill this morning. Please call patient and advise.

## 2013-09-28 NOTE — Telephone Encounter (Signed)
Dr Frances FurbishAthar is out of the office,  [facility]

## 2013-10-01 MED ORDER — AMPHETAMINE-DEXTROAMPHET ER 15 MG PO CP24
15.0000 mg | ORAL_CAPSULE | ORAL | Status: DC
Start: 2013-10-01 — End: 2013-10-02

## 2013-10-02 ENCOUNTER — Other Ambulatory Visit: Payer: Self-pay | Admitting: Neurology

## 2013-10-02 DIAGNOSIS — G4733 Obstructive sleep apnea (adult) (pediatric): Secondary | ICD-10-CM

## 2013-10-02 DIAGNOSIS — G4711 Idiopathic hypersomnia with long sleep time: Secondary | ICD-10-CM

## 2013-10-02 DIAGNOSIS — Z9989 Dependence on other enabling machines and devices: Secondary | ICD-10-CM

## 2013-10-02 MED ORDER — AMPHETAMINE-DEXTROAMPHET ER 15 MG PO CP24: 15.0000 mg | ORAL_CAPSULE | ORAL | Status: DC

## 2013-10-02 NOTE — Telephone Encounter (Signed)
Pt came by the office to pick her Rx up today.

## 2013-10-10 ENCOUNTER — Telehealth: Payer: Self-pay | Admitting: *Deleted

## 2013-10-10 DIAGNOSIS — Z9989 Dependence on other enabling machines and devices: Secondary | ICD-10-CM

## 2013-10-10 DIAGNOSIS — G4733 Obstructive sleep apnea (adult) (pediatric): Secondary | ICD-10-CM

## 2013-10-10 DIAGNOSIS — G4711 Idiopathic hypersomnia with long sleep time: Secondary | ICD-10-CM

## 2013-10-10 NOTE — Telephone Encounter (Signed)
Pt is calling requesting that the dosage be changed for adderall XR and/or Nuvigil. Pt states that she is drinking a lot of  Coffee during the day to stay awake and it's affecting her sleep. Pt would like Dr. Frances FurbishAthar to call back please, thanks,

## 2013-10-10 NOTE — Telephone Encounter (Signed)
Pt wants to know if dose can be changed foramphetamine-dextroamphetamine (ADDERALL XR) 15 MG 24 hr capsule and/or Nuvigil.  She states she's drinking a lot of coffee during the day to stay awake and its affecting her sleep.   Please call patient to advise.  Thanks

## 2013-10-12 MED ORDER — AMPHETAMINE-DEXTROAMPHET ER 20 MG PO CP24: 20.0000 mg | ORAL_CAPSULE | ORAL | Status: DC

## 2013-10-12 NOTE — Telephone Encounter (Signed)
Please advise pt we will increase Adderall XR to 20 mg once daily till next appt. Nuvigil will stay the same.  Rx printed and ready for pick up.

## 2013-10-12 NOTE — Telephone Encounter (Signed)
Called pt to inform her per Dr. Frances FurbishAthar that she is increasing pt's adderall XR to 20 mg once daily until pt's next appt and the nuvigil will stay the same and that the RX was ready to be picked up at the front desk. I advised the pt that if she has any other problems, questions or concerns to call the office. Pt verbalized understanding.

## 2013-10-12 NOTE — Telephone Encounter (Signed)
Pt called and stated she's just leaving work and unable to pick up ramphetamine-dextroamphetamine (ADDERALL XR) 20 MG 24 hr capsule x.Marland Kitchen.Marland Kitchen.Could rx be called into pharmacy at [facility] [address].  Please call pt. Thanks

## 2013-11-09 ENCOUNTER — Encounter (INDEPENDENT_AMBULATORY_CARE_PROVIDER_SITE_OTHER): Payer: Self-pay

## 2013-11-09 ENCOUNTER — Other Ambulatory Visit: Payer: Self-pay | Admitting: Obstetrics and Gynecology

## 2013-11-09 ENCOUNTER — Ambulatory Visit
Admission: RE | Admit: 2013-11-09 | Discharge: 2013-11-09 | Disposition: A | Discharge status: Home / Self Care | Payer: 59 | Source: Ambulatory Visit | Attending: Obstetrics and Gynecology | Admitting: Obstetrics and Gynecology

## 2013-11-09 DIAGNOSIS — Z1231 Encounter for screening mammogram for malignant neoplasm of breast: Secondary | ICD-10-CM

## 2013-11-09 DIAGNOSIS — Z01419 Encounter for gynecological examination (general) (routine) without abnormal findings: Secondary | ICD-10-CM

## 2013-11-15 ENCOUNTER — Other Ambulatory Visit: Payer: Self-pay | Admitting: Neurology

## 2013-11-15 DIAGNOSIS — G4733 Obstructive sleep apnea (adult) (pediatric): Secondary | ICD-10-CM

## 2013-11-15 DIAGNOSIS — Z9989 Dependence on other enabling machines and devices: Secondary | ICD-10-CM

## 2013-11-15 DIAGNOSIS — G4711 Idiopathic hypersomnia with long sleep time: Secondary | ICD-10-CM

## 2013-11-15 MED ORDER — AMPHETAMINE-DEXTROAMPHET ER 20 MG PO CP24: 20.0000 mg | ORAL_CAPSULE | ORAL | Status: DC

## 2013-11-15 NOTE — Telephone Encounter (Signed)
Request forwarded to provider for approval  

## 2013-11-15 NOTE — Telephone Encounter (Signed)
Patient calling to request Adderall script refill. Please call and advise patient, she says she wants to come by tomorrow to pick it up.

## 2013-11-29 ENCOUNTER — Telehealth: Payer: Self-pay | Admitting: Obstetrics and Gynecology

## 2013-11-29 NOTE — Telephone Encounter (Signed)
Patient calling to speak with nurse about how to get BRCA testing for the breast cancer gene.

## 2013-11-30 NOTE — Telephone Encounter (Signed)
LMTCB for appt with BS.

## 2013-11-30 NOTE — Telephone Encounter (Signed)
Dr. Quincy Simmonds,   If patient is requesting BRCA gene testing, I do not see family hx of breast cancer, does she need to come in for consult with you or can a referral be made to genetics testing at [facility]?

## 2013-11-30 NOTE — Telephone Encounter (Signed)
I think a visit with me would be helpful to understand further how we can help her better.

## 2013-12-10 ENCOUNTER — Ambulatory Visit (INDEPENDENT_AMBULATORY_CARE_PROVIDER_SITE_OTHER): Payer: 59 | Admitting: Neurology

## 2013-12-10 ENCOUNTER — Encounter (INDEPENDENT_AMBULATORY_CARE_PROVIDER_SITE_OTHER): Payer: Self-pay

## 2013-12-10 ENCOUNTER — Encounter: Payer: Self-pay | Admitting: Neurology

## 2013-12-10 VITALS — BP 105/70 | HR 89 | Temp 98.6°F | Ht 66.0 in | Wt 143.0 lb

## 2013-12-10 DIAGNOSIS — Z9989 Dependence on other enabling machines and devices: Secondary | ICD-10-CM

## 2013-12-10 DIAGNOSIS — F411 Generalized anxiety disorder: Secondary | ICD-10-CM

## 2013-12-10 DIAGNOSIS — G4733 Obstructive sleep apnea (adult) (pediatric): Secondary | ICD-10-CM

## 2013-12-10 DIAGNOSIS — R51 Headache: Secondary | ICD-10-CM

## 2013-12-10 DIAGNOSIS — Z823 Family history of stroke: Secondary | ICD-10-CM

## 2013-12-10 DIAGNOSIS — F329 Major depressive disorder, single episode, unspecified: Secondary | ICD-10-CM

## 2013-12-10 DIAGNOSIS — F32A Depression, unspecified: Secondary | ICD-10-CM

## 2013-12-10 DIAGNOSIS — E109 Type 1 diabetes mellitus without complications: Secondary | ICD-10-CM

## 2013-12-10 DIAGNOSIS — G4711 Idiopathic hypersomnia with long sleep time: Secondary | ICD-10-CM

## 2013-12-10 DIAGNOSIS — F3289 Other specified depressive episodes: Secondary | ICD-10-CM

## 2013-12-10 DIAGNOSIS — R413 Other amnesia: Secondary | ICD-10-CM

## 2013-12-10 DIAGNOSIS — G4710 Hypersomnia, unspecified: Secondary | ICD-10-CM

## 2013-12-10 MED ORDER — AMPHETAMINE-DEXTROAMPHET ER 30 MG PO CP24: 30.0000 mg | ORAL_CAPSULE | ORAL | Status: DC

## 2013-12-10 MED ORDER — ARMODAFINIL 250 MG PO TABS: 250.0000 mg | ORAL_TABLET | Freq: Every day | ORAL | Status: DC
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
## 2013-12-10 NOTE — Patient Instructions (Addendum)
We will increase your Adderall XR to 30 mg daily. We may increase it further. Talk to your counselor and see if she recommends a referral to a psychiatrist.  We will continue to use Nuvigil 250 mg daily, which is the maximum dose recommended.   We will do a brain MRI and will call you with the results.

## 2013-12-13 NOTE — Telephone Encounter (Signed)
Pt is agreeable with office visit.  Pt states she has appt in September.  Appt date and time verified.  Appt is for AEX.  Pt will discuss at that time.  Pt agreeable with disposition.  Closing encounter.

## 2013-12-30 ENCOUNTER — Ambulatory Visit
Admission: RE | Admit: 2013-12-30 | Discharge: 2013-12-30 | Disposition: A | Discharge status: Home / Self Care | Payer: 59 | Source: Ambulatory Visit | Attending: Neurology | Admitting: Neurology

## 2013-12-30 DIAGNOSIS — Z9989 Dependence on other enabling machines and devices: Secondary | ICD-10-CM

## 2013-12-30 DIAGNOSIS — F411 Generalized anxiety disorder: Secondary | ICD-10-CM

## 2013-12-30 DIAGNOSIS — R413 Other amnesia: Secondary | ICD-10-CM

## 2013-12-30 DIAGNOSIS — F329 Major depressive disorder, single episode, unspecified: Secondary | ICD-10-CM

## 2013-12-30 DIAGNOSIS — G4733 Obstructive sleep apnea (adult) (pediatric): Secondary | ICD-10-CM

## 2013-12-30 DIAGNOSIS — F32A Depression, unspecified: Secondary | ICD-10-CM

## 2013-12-30 DIAGNOSIS — Z823 Family history of stroke: Secondary | ICD-10-CM

## 2013-12-30 DIAGNOSIS — G4711 Idiopathic hypersomnia with long sleep time: Secondary | ICD-10-CM

## 2013-12-30 DIAGNOSIS — R51 Headache: Secondary | ICD-10-CM

## 2013-12-30 DIAGNOSIS — E109 Type 1 diabetes mellitus without complications: Secondary | ICD-10-CM

## 2014-01-04 NOTE — Progress Notes (Signed)
Quick Note:  Please call and advise the patient that the recent scan we did was within normal limits. 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 Please inquire and I can order neck MRI. Please remind patient to keep any upcoming appointments or tests and to call us with any interim questions, concerns, problems or updates. Thanks,  Huston FoleySaima Dondi Burandt, MD, PhD   ______

## 2014-01-07 ENCOUNTER — Telehealth: Payer: Self-pay | Admitting: Neurology

## 2014-01-07 DIAGNOSIS — R9389 Abnormal findings on diagnostic imaging of other specified body structures: Secondary | ICD-10-CM

## 2014-01-07 DIAGNOSIS — Z9989 Dependence on other enabling machines and devices: Secondary | ICD-10-CM

## 2014-01-07 DIAGNOSIS — G4711 Idiopathic hypersomnia with long sleep time: Secondary | ICD-10-CM

## 2014-01-07 DIAGNOSIS — M542 Cervicalgia: Secondary | ICD-10-CM

## 2014-01-07 DIAGNOSIS — G4733 Obstructive sleep apnea (adult) (pediatric): Secondary | ICD-10-CM

## 2014-01-07 MED ORDER — AMPHETAMINE-DEXTROAMPHET ER 20 MG PO CP24: 40.0000 mg | ORAL_CAPSULE | ORAL | Status: DC

## 2014-01-07 NOTE — Progress Notes (Signed)
Quick Note:  I called pt and relayed the results of her MRI brain. (incidenatal findings of DDD neck and chronic sinus inflammation). She stated that she has been treated for bronchitis (zpak) thru urgent care and still having drainage, she may go back to urgent care for this if persists. She did state that she has been having neck problems (pain) thinking it may be her mattress, pillows. She would like to proceed with MRI neck order. She stated that she continues with taking caffeine along with the Adderall XR 30mg  po daily. Has 2 tabs left. Increase to 40mg  per note? (needs refill). ______

## 2014-01-07 NOTE — Telephone Encounter (Signed)
Of note, error in office Note:  She is on Nuvigil 250 mg strength, not 450 mg. Please advise patient to pick up Adderall prescription. We will increase to 40 mg once daily Order for neck MRI placed

## 2014-01-08 NOTE — Telephone Encounter (Signed)
Called pt to inform her that her Rx was ready to be picked up at the front desk and if she has any other problems, questions or concerns to call the office. Pt verbalized understanding. °

## 2014-01-12 DIAGNOSIS — M542 Cervicalgia: Secondary | ICD-10-CM

## 2014-01-13 ENCOUNTER — Ambulatory Visit
Admission: RE | Admit: 2014-01-13 | Discharge: 2014-01-13 | Disposition: A | Discharge status: Home / Self Care | Payer: 59 | Source: Ambulatory Visit | Attending: Neurology | Admitting: Neurology

## 2014-01-13 DIAGNOSIS — M542 Cervicalgia: Secondary | ICD-10-CM

## 2014-01-13 DIAGNOSIS — R9389 Abnormal findings on diagnostic imaging of other specified body structures: Secondary | ICD-10-CM

## 2014-01-20 ENCOUNTER — Telehealth: Payer: Self-pay

## 2014-01-20 NOTE — Telephone Encounter (Signed)
[employer] notified us they have approved our request for coverage on Adderall XR effective until the policy changes or is terminated Ref # [id number]

## 2014-02-15 ENCOUNTER — Telehealth: Payer: Self-pay | Admitting: Neurology

## 2014-02-15 DIAGNOSIS — G4733 Obstructive sleep apnea (adult) (pediatric): Secondary | ICD-10-CM

## 2014-02-15 DIAGNOSIS — Z9989 Dependence on other enabling machines and devices: Secondary | ICD-10-CM

## 2014-02-15 DIAGNOSIS — G4711 Idiopathic hypersomnia with long sleep time: Secondary | ICD-10-CM

## 2014-02-15 NOTE — Telephone Encounter (Signed)
I called back and spoke with the patient.  She is requesting a dose increase on Adderall.  It appears we increased the dose on 07/13 to 40mg  daily, but she does not feel this is effective.  States she is having difficulty concentrating, and received a verbal warning at work today because of this.  Says she has decreased her Caffeine intake, and is often tired.  Please advise.  Thank you.

## 2014-02-15 NOTE — Telephone Encounter (Signed)
Patient calling to state that she would like the dosage increased on her Adderall, states that it is affecting her job performance and she got a verbal warning today at work because she can't concentrate. Please return call to patient and advise.

## 2014-02-19 MED ORDER — AMPHETAMINE-DEXTROAMPHET ER 20 MG PO CP24: 40.0000 mg | ORAL_CAPSULE | ORAL | Status: DC

## 2014-02-19 MED ORDER — AMPHETAMINE-DEXTROAMPHETAMINE 5 MG PO TABS: 5.0000 mg | ORAL_TABLET | Freq: Every day | ORAL | Status: DC

## 2014-02-19 NOTE — Telephone Encounter (Signed)
I would like to suggest continuing with Adderall long-acting 40 mg once daily in the morning. She can add 5 mg of immediate release Adderall generic at midday, no later than 3 PM. Both prescriptions done and ready

## 2014-02-19 NOTE — Telephone Encounter (Signed)
Called pt to inform her that her Rx was ready to be picked up at the front desk and if she has any other problems, questions or concerns to call the office. Pt verbalized understanding. °

## 2014-02-22 ENCOUNTER — Telehealth: Payer: Self-pay | Admitting: Neurology

## 2014-02-22 DIAGNOSIS — M542 Cervicalgia: Secondary | ICD-10-CM

## 2014-02-22 DIAGNOSIS — M5030 Other cervical disc degeneration, unspecified cervical region: Secondary | ICD-10-CM

## 2014-02-22 DIAGNOSIS — M5020 Other cervical disc displacement, unspecified cervical region: Secondary | ICD-10-CM

## 2014-02-22 NOTE — Telephone Encounter (Signed)
Called patient's listed cell phone number regarding recent MRI cervical spine. I left a fairly generic message that I hadn't talked to her about her cervical spine MRI results. I told her not to be alarmed about this phone call but I did not see where we called her about the test results I apologize for that. I asked her to call us back so we can talk to her about the MRI results. Once patient calls. pls advise her that there are degenerative changes in her cervical spine at multiple levels which could explain pain and stiffness in her neck. I would like to proceed with a referral to a spine specialist particularly a neurosurgeon. If she has one in mind or would like to see someone in particular we can certainly make that happen or try at least. Otherwise we can just make a referral to neurosurgery locally here. I will go ahead and place a neurosurgery referral in her chart in anticipation that we will proceed with it.

## 2014-02-27 NOTE — Telephone Encounter (Signed)
Referral faxed, see referral note in epic

## 2014-03-01 ENCOUNTER — Telehealth: Payer: Self-pay | Admitting: Neurology

## 2014-03-01 NOTE — Telephone Encounter (Signed)
Patient returning call to Dr. Frances Furbish regarding her neck MRI, please return call and advise.

## 2014-03-01 NOTE — Telephone Encounter (Signed)
Spoke to new patient coordinator, Thayer Ohm, at [facility] and she has been in contact with the patient.  She has not been scheduled yet and is waiting for the patient call her back to schedule.

## 2014-03-01 NOTE — Telephone Encounter (Signed)
fyi

## 2014-03-01 NOTE — Telephone Encounter (Signed)
Patient scheduled appointment @ [facility] 03/11/14. She was offered an earlier appointment but declined.

## 2014-03-14 ENCOUNTER — Ambulatory Visit (INDEPENDENT_AMBULATORY_CARE_PROVIDER_SITE_OTHER): Payer: 59 | Admitting: Obstetrics and Gynecology

## 2014-03-14 ENCOUNTER — Encounter: Payer: Self-pay | Admitting: Obstetrics and Gynecology

## 2014-03-14 VITALS — BP 118/78 | HR 90 | Resp 14 | Ht 67.0 in | Wt 140.8 lb

## 2014-03-14 DIAGNOSIS — Z01419 Encounter for gynecological examination (general) (routine) without abnormal findings: Secondary | ICD-10-CM

## 2014-03-14 MED ORDER — ESTROGENS CONJ SYNTHETIC B 1.25 MG PO TABS: 1.2500 mg | ORAL_TABLET | Freq: Every day | ORAL | Status: DC

## 2014-03-14 NOTE — Patient Instructions (Signed)

## 2014-03-14 NOTE — Progress Notes (Signed)
GYNECOLOGY VISIT  PCP: Adrian Prince, MD   Referring provider:   HPI: 48 y.o.   Divorced  [place]  female   [id number] with Patient's last menstrual period was 06/28/2000.   here for AEX. Wants to increase the Enjuvia.  Having increased hot flashes.  Blood sugar is normal when this is happening.   Having memory loss.  Had a normal MRI of the brain.  Memory change is being attributed to low blood sugars.   Uses CPAP machine.  Wakes up tired.   Hgb: PCP   Urine:  PCP  GYNECOLOGIC HISTORY: Patient's last menstrual period was 06/28/2000. 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 Other Mother     cysts  . Diabetes Father   . Kidney disease Father   . Stroke Paternal Aunt     Patient Active Problem List   Diagnosis Date Noted  . HYPOTHYROIDISM 09/21/2007  . DIABETES MELLITUS, TYPE I 09/21/2007  . ANXIETY DEPRESSION 09/21/2007  . NAUSEA 09/21/2007   Past Medical History  Diagnosis Date  . Diabetes mellitus   . Stress   . Breast cyst     left  . 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 Thyroid disease     hypothyroidism  . 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 VAIN I (vaginal intraepithelial neoplasia grade I) 2013  . Sleep apnea 2013  . Meningitis 06/2001    --hospital  . Ketoacidosis [id number]    Past Surgical History  Procedure Laterality Date  . Abdominal hysterectomy      2004  . Tonsillectomy and adenoidectomy    . Hand surgery  2004    tore tendon  . Soft tissue cyst excision  12/2010    left breast epidermoid inclusion cyst  . Cervical biopsy  w/ loop electrode excision  01-24-95    CIN I  . Colposcopy  11-21-02    CIN III  . Colposcopy  05-05-12    no lesions--needs repeat pap 08/2012 per Dr. Tresa Res    ALLERGIES: Bee venom; Penicillins; and Sulfa antibiotics  Current Outpatient Prescriptions  Medication Sig Dispense Refill  . ALPRAZolam (XANAX) 0.5 MG tablet Take 0.25 mg by mouth as needed.       Marland Kitchen amphetamine-dextroamphetamine (ADDERALL XR) 20 MG 24 hr capsule Take 2 capsules (40 mg total) by mouth every  morning.  60 capsule  0  . amphetamine-dextroamphetamine (ADDERALL) 5 MG tablet Take 1 tablet by mouth daily. Midday, no later than 3PM  30 tablet  0  . escitalopram (LEXAPRO) 10 MG tablet Take 10 mg by mouth daily.        Marland Kitchen estrogens conjugated, synthetic B, (ENJUVIA) 1.25 MG tablet Take 1 tablet (1.25 mg total) by mouth daily.  30 tablet  11  . insulin lispro (HUMALOG) 100 UNIT/ML injection Inject 25 Units into the skin daily.       Marland Kitchen levothyroxine (SYNTHROID, LEVOTHROID) 112 MCG tablet Take 112 mcg by mouth daily.        . ONE TOUCH ULTRA TEST test strip       . buPROPion (WELLBUTRIN SR) 150 MG 12 hr tablet Take 3 tablets by mouth daily.       No current facility-administered medications for this visit.     ROS:  Pertinent items are noted in HPI.  History   Social History  . Marital Status: Divorced    Spouse Name: N/A    Number of Children: 0  . Years of Education: N/A   Occupational History   . [employer] ANALYST    Social History Main Topics  . Smoking status: Never Smoker   . Smokeless tobacco: Never Used  . Alcohol Use: Yes  . Drug Use: No  . Sexual Activity: Yes    Partners: Male    Birth Control/ Protection: Surgical     Comment: TVH   Other Topics Concern  . Not on file   Social History Narrative  . No narrative on file    PHYSICAL EXAMINATION:    LMP 06/28/2000   Wt Readings from Last 3 Encounters:  12/10/13 143 lb (64.864 kg)  07/26/13 142 lb (64.411 kg)  03/28/13 149 lb (67.586 kg)     Ht Readings from Last 3 Encounters:  12/10/13  (1.676 m)  07/26/13  (1.676 m)  03/28/13  (1.676 m)    General appearance: alert, cooperative and appears stated age Head: Normocephalic, without obvious abnormality, atraumatic Neck: no adenopathy, supple, symmetrical, trachea midline and thyroid not enlarged, symmetric, no tenderness/mass/nodules Lungs: clear to auscultation bilaterally Breasts: Inspection negative on right, superior scar on left, No nipple retraction or dimpling, No nipple discharge or bleeding, No axillary or supraclavicular adenopathy, Normal to palpation without dominant masses Heart: regular rate and rhythm Abdomen: soft, non-tender; no masses,  no organomegaly Extremities: extremities normal, atraumatic, no cyanosis or edema Skin: Skin color, texture, turgor normal. No rashes or lesions. Insulin pump and blood glucose recorder in skin of abdomen.  Lymph nodes: Cervical, supraclavicular, and axillary nodes normal. No abnormal inguinal nodes palpated Neurologic: Grossly normal  Pelvic: External genitalia:  no lesions              Urethra:  normal appearing urethra with no masses, tenderness or lesions              Bartholins and Skenes: normal                 Vagina: normal appearing vagina with normal color and discharge, no lesions              Cervix:  absent              Pap and high risk HPV testing done: Yes.           Bimanual Exam:  Uterus:  absent  Adnexa: normal adnexa in size, nontender and no masses                                      Rectovaginal:  Yes.                                       Confirms above.                                      Anus:  normal sphincter tone, no lesions  ASSESSMENT  Normal gynecologic exam. Status post TVH.  History of CIN III and VAIN I.  ERT patient. Cognitive change.  PLAN  Mammogram recommended yearly starting at age 89. Pap smear and high risk HPV testing as above. Counseled on self breast exam, Calcium and vitamin D intake, exercise. Will keep Enjuvia at same dosage.  Discussed risks of ERT - DVT, PE, MI, stroke, breast cancer.  I do not recommend increasing her Enjuvia at this time. Patient agrees.  See lab orders: No. Discussed ways to improve cognitive function - exercise, games to challenge brain, avoidance of ETOH, writing things down.  Return annually or prn   An After Visit Summary was printed and given to the patient.

## 2014-03-15 ENCOUNTER — Ambulatory Visit: Payer: 59 | Admitting: Obstetrics and Gynecology

## 2014-03-20 LAB — IPS PAP TEST WITH HPV

## 2014-03-28 ENCOUNTER — Telehealth: Payer: Self-pay | Admitting: Neurology

## 2014-03-28 DIAGNOSIS — Z9989 Dependence on other enabling machines and devices: Secondary | ICD-10-CM

## 2014-03-28 DIAGNOSIS — G4711 Idiopathic hypersomnia with long sleep time: Secondary | ICD-10-CM

## 2014-03-28 DIAGNOSIS — G4733 Obstructive sleep apnea (adult) (pediatric): Secondary | ICD-10-CM

## 2014-03-28 MED ORDER — AMPHETAMINE-DEXTROAMPHET ER 20 MG PO CP24: 40.0000 mg | ORAL_CAPSULE | ORAL | Status: DC

## 2014-03-28 MED ORDER — AMPHETAMINE-DEXTROAMPHETAMINE 5 MG PO TABS: 5.0000 mg | ORAL_TABLET | Freq: Every day | ORAL | Status: DC

## 2014-03-28 NOTE — Telephone Encounter (Signed)
Memory loss has really never been something I have addressed with her. I see her for her sleep disorder. Short-term disability is best addressed with primary care provider. She was advised to discuss her mood disorder with her primary care physician. Flareup of anxiety and depression can affect memory and she had endorsed flareup in her anxiety and depression at the last visit. I would recommend she meet with her primary care physician regarding this.

## 2014-03-28 NOTE — Telephone Encounter (Signed)
Rx done. 

## 2014-03-28 NOTE — Telephone Encounter (Signed)
Patient calling to state that she received a 30 day warning at work due to problems with her memory, patient has 1 day of meds left, Adderall, and needs refill. Patient also wants to discuss short term disability so that she doesn't get into more trouble at work. Please call and advise.

## 2014-03-28 NOTE — Telephone Encounter (Signed)
Per note, patient complains of memory problems and would like to discuss possible short term disability due to warnings she has gotten at work.   As well requesting a refill on Adderall.    Rx has been entered, forwarded to provider for approval

## 2014-03-29 NOTE — Telephone Encounter (Signed)
Called pt to inform her that her Rx was ready to be picked up at the front desk and if she has any other problems, questions or concerns to call the office. Pt verbalized understanding. °

## 2014-04-01 ENCOUNTER — Telehealth: Payer: Self-pay | Admitting: Neurology

## 2014-04-01 NOTE — Telephone Encounter (Signed)
Patient wants to know if she needs to be seen sooner or what can be done as she is concerned she is going to lose her job due to not remembering things. Before she was sleepy. Now she is not sleepy but cannot remember anything and is afraid she will lose her job over it.  The best call back number is [phone] and it is okay to leave a detailed message.

## 2014-04-03 NOTE — Telephone Encounter (Signed)
Okay to schedule with NP, Larita FifeLynn or [place], thx

## 2014-04-04 ENCOUNTER — Telehealth: Payer: Self-pay | Admitting: *Deleted

## 2014-04-04 ENCOUNTER — Ambulatory Visit: Payer: Self-pay | Admitting: Nurse Practitioner

## 2014-04-04 NOTE — Telephone Encounter (Signed)
Called patient scheduled appointment with NP MM today at 1:30 pm.

## 2014-04-04 NOTE — Telephone Encounter (Signed)
Appointment was changed to NP CM, due to NP MM was overbooked and NP LL has no openings, patient will be seen at 2 pm today.

## 2014-04-04 NOTE — Telephone Encounter (Signed)
Patient calling to r/s appointment on today, patient states that something came up at her job and she can't make the appointment today, patient was r/s for 04/05/14 with NP LL.

## 2014-04-05 ENCOUNTER — Ambulatory Visit (INDEPENDENT_AMBULATORY_CARE_PROVIDER_SITE_OTHER): Payer: 59 | Admitting: Nurse Practitioner

## 2014-04-05 ENCOUNTER — Encounter: Payer: Self-pay | Admitting: Nurse Practitioner

## 2014-04-05 VITALS — BP 134/78 | HR 78 | Temp 99.2°F | Ht 66.0 in | Wt 137.6 lb

## 2014-04-05 DIAGNOSIS — R413 Other amnesia: Secondary | ICD-10-CM

## 2014-04-05 DIAGNOSIS — F32A Depression, unspecified: Secondary | ICD-10-CM

## 2014-04-05 DIAGNOSIS — G4711 Idiopathic hypersomnia with long sleep time: Secondary | ICD-10-CM

## 2014-04-05 DIAGNOSIS — G4733 Obstructive sleep apnea (adult) (pediatric): Secondary | ICD-10-CM

## 2014-04-05 DIAGNOSIS — Z9989 Dependence on other enabling machines and devices: Secondary | ICD-10-CM

## 2014-04-05 DIAGNOSIS — G3184 Mild cognitive impairment, so stated: Secondary | ICD-10-CM

## 2014-04-05 DIAGNOSIS — F329 Major depressive disorder, single episode, unspecified: Secondary | ICD-10-CM

## 2014-04-05 NOTE — Patient Instructions (Addendum)
Your memory testing in the office will serve as a "baseline" test for us in the future. Your score today is on the line of Normal and Mild Cognitive Impairment.  Mild cognitive impairment (MCI) is a condition in which people have more memory or other thinking problems than normal for their age, but their symptoms do not interfere with their everyday lives. Older people with MCI are at greater risk for developing Alzheimer's, but not all of them do. Some may even go back to normal cognition. Studies are underway to learn why some people with MCI progress to Alzheimer's and others do not.  The problems associated with MCI may also be caused by certain medications, cerebrovascular disease (which affects blood vessels that supply the brain), and other factors. Some of the problems brought on by these conditions can be managed or reversed.  The type of MCI with memory loss as the main symptom is called amnestic MCI. In another type, nonamnestic MCI, the main symptom is an impaired thinking skill other than memory loss, such as trouble planning and organizing or poor judgment.  Dr. Frances FurbishAthar has recommended a Neuro-Psychology evaluation by Dr. Eula FlaxMichael Zelson, Phd at the [facility] next door to our office (in the same building).  This is a more in depth evaluation that will explore what your cognitive problems may be.  You will receive a call from his office to schedule this evaluation.  We cannot recommend any disability without further testing.  Please follow up with Dr. Frances FurbishAthar at your next scheduled appointment on 05/16/14.
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
## 2014-05-07 ENCOUNTER — Other Ambulatory Visit: Payer: Self-pay | Admitting: Neurology

## 2014-05-07 DIAGNOSIS — Z9989 Dependence on other enabling machines and devices: Secondary | ICD-10-CM

## 2014-05-07 DIAGNOSIS — G4711 Idiopathic hypersomnia with long sleep time: Secondary | ICD-10-CM

## 2014-05-07 DIAGNOSIS — G4733 Obstructive sleep apnea (adult) (pediatric): Secondary | ICD-10-CM

## 2014-05-07 MED ORDER — AMPHETAMINE-DEXTROAMPHET ER 20 MG PO CP24: 40.0000 mg | ORAL_CAPSULE | ORAL | Status: DC

## 2014-05-07 MED ORDER — AMPHETAMINE-DEXTROAMPHETAMINE 5 MG PO TABS: 5.0000 mg | ORAL_TABLET | Freq: Every day | ORAL | Status: DC

## 2014-05-07 NOTE — Telephone Encounter (Signed)
Request entered, forwarded to provider for approval.  

## 2014-05-07 NOTE — Telephone Encounter (Signed)
Patient requesting refill of Adderall, please call when ready for pick up, patient states that she is completely out.

## 2014-05-08 NOTE — Telephone Encounter (Signed)
I called the patient to let them know their Rx for Adderall XR was ready for pickup. Patient was instructed to bring Photo ID. 

## 2014-05-08 NOTE — Addendum Note (Signed)
Addended by: Arlis PortaHUGHES, Tyrell Seifer on: 05/08/2014 10:33 AM   Modules accepted: Orders, Medications

## 2014-05-16 ENCOUNTER — Ambulatory Visit: Payer: 59 | Admitting: Neurology

## 2014-05-21 HISTORY — PX: SPINE SURGERY: SHX786

## 2014-05-22 ENCOUNTER — Ambulatory Visit: Payer: 59 | Admitting: Neurology

## 2014-06-10 ENCOUNTER — Ambulatory Visit: Payer: 59 | Admitting: Neurology

## 2014-06-12 ENCOUNTER — Telehealth: Payer: Self-pay | Admitting: Neurology

## 2014-06-12 ENCOUNTER — Other Ambulatory Visit: Payer: Self-pay | Admitting: Neurology

## 2014-06-12 DIAGNOSIS — Z9989 Dependence on other enabling machines and devices: Secondary | ICD-10-CM

## 2014-06-12 DIAGNOSIS — G4733 Obstructive sleep apnea (adult) (pediatric): Secondary | ICD-10-CM

## 2014-06-12 DIAGNOSIS — G4711 Idiopathic hypersomnia with long sleep time: Secondary | ICD-10-CM

## 2014-06-12 MED ORDER — AMPHETAMINE-DEXTROAMPHET ER 20 MG PO CP24: 40.0000 mg | ORAL_CAPSULE | ORAL | Status: DC

## 2014-06-12 MED ORDER — AMPHETAMINE-DEXTROAMPHETAMINE 5 MG PO TABS: 5.0000 mg | ORAL_TABLET | Freq: Every day | ORAL | Status: DC

## 2014-06-12 NOTE — Telephone Encounter (Signed)
Patient requesting Rx refill for amphetamine-dextroamphetamine (ADDERALL XR) 20 MG 24 hr capsule and amphetamine-dextroamphetamine (ADDERALL) 5 MG tablet.  Please call when ready for pick up.

## 2014-06-12 NOTE — Telephone Encounter (Signed)
Please ask her to monitor her weight loss. Maybe she can reduce the Adderall use and keep an eye on the weight.

## 2014-06-12 NOTE — Telephone Encounter (Signed)
Request entered, forwarded to provider for approval.  

## 2014-06-12 NOTE — Telephone Encounter (Signed)
Shared Dr Teofilo PodAthar's message below with patient, verbalized understanding and said ok

## 2014-06-12 NOTE — Telephone Encounter (Signed)
Patient stated she has lost 20 pounds since taking Adderall.  Questioning if she needs to be seen before 1/19.  Please call and advise

## 2014-06-12 NOTE — Telephone Encounter (Signed)
I called the patient to let them know their Rx for Adderall XR was ready for pickup. Patient was instructed to bring Photo ID. 

## 2014-06-19 ENCOUNTER — Other Ambulatory Visit: Payer: Self-pay | Admitting: Obstetrics and Gynecology

## 2014-06-19 MED ORDER — VALACYCLOVIR HCL 500 MG PO TABS: 500.0000 mg | ORAL_TABLET | Freq: Two times a day (BID) | ORAL | Status: DC

## 2014-06-19 NOTE — Telephone Encounter (Signed)
Medication refill request: Valtrex 500mg  Last AEX:  03/14/14 Next AEX: 03/21/15 Last MMG (if hormonal medication request): NA Refill authorized: #[id number] X 12 Pt chart is in your office basket

## 2014-06-19 NOTE — Telephone Encounter (Signed)
Patient is requesting a refill of Valtrex. Patient needs by the end of the year. Patient confirmed pharmacy on file.

## 2014-07-10 ENCOUNTER — Telehealth: Payer: Self-pay | Admitting: *Deleted

## 2014-07-10 NOTE — Telephone Encounter (Signed)
[facility]:  " Lear Ngnjuvia has been discontinued by manufacturer. Need substitute drug for replacement"   Medication refill request: Substitution  Last AEX:  03/14/14 Next AEX: 03/21/15 Last MMG (if hormonal medication request): 11/13/13 [id number] Refill authorized: 03/14/14 #[id number].  Please advise

## 2014-07-11 ENCOUNTER — Other Ambulatory Visit: Payer: Self-pay

## 2014-07-11 ENCOUNTER — Other Ambulatory Visit: Payer: Self-pay | Admitting: Obstetrics and Gynecology

## 2014-07-11 MED ORDER — ARMODAFINIL 250 MG PO TABS: 250.0000 mg | ORAL_TABLET | Freq: Every day | ORAL | Status: DC

## 2014-07-11 MED ORDER — ESTROGENS CONJUGATED 1.25 MG PO TABS: 1.2500 mg | ORAL_TABLET | Freq: Every day | ORAL | Status: DC

## 2014-07-11 NOTE — Telephone Encounter (Signed)
I sent a prescription to her pharmacy for Premarin 1.25 mg daily.  #90, RF 2

## 2014-07-12 ENCOUNTER — Telehealth: Payer: Self-pay | Admitting: Neurology

## 2014-07-12 NOTE — Telephone Encounter (Signed)
Called and left message that medication is ready for pick up

## 2014-07-12 NOTE — Telephone Encounter (Signed)
Rx signed and faxed.

## 2014-07-15 ENCOUNTER — Telehealth: Payer: Self-pay | Admitting: Neurology

## 2014-07-15 NOTE — Telephone Encounter (Signed)
Called patient and left Vm to bring machine for tomorrow's visit, need a recent download.

## 2014-07-16 ENCOUNTER — Encounter: Payer: Self-pay | Admitting: Neurology

## 2014-07-16 ENCOUNTER — Telehealth: Payer: Self-pay | Admitting: Neurology

## 2014-07-16 ENCOUNTER — Ambulatory Visit (INDEPENDENT_AMBULATORY_CARE_PROVIDER_SITE_OTHER): Payer: 59 | Admitting: Neurology

## 2014-07-16 VITALS — BP 130/78 | HR 107 | Temp 97.6°F | Ht 63.0 in | Wt 145.0 lb

## 2014-07-16 DIAGNOSIS — M5020 Other cervical disc displacement, unspecified cervical region: Secondary | ICD-10-CM

## 2014-07-16 DIAGNOSIS — G4733 Obstructive sleep apnea (adult) (pediatric): Secondary | ICD-10-CM

## 2014-07-16 DIAGNOSIS — Z9989 Dependence on other enabling machines and devices: Secondary | ICD-10-CM

## 2014-07-16 DIAGNOSIS — Z9889 Other specified postprocedural states: Secondary | ICD-10-CM

## 2014-07-16 DIAGNOSIS — G4711 Idiopathic hypersomnia with long sleep time: Secondary | ICD-10-CM

## 2014-07-16 MED ORDER — ARMODAFINIL 250 MG PO TABS: 250.0000 mg | ORAL_TABLET | Freq: Every day | ORAL | Status: DC

## 2014-07-16 MED ORDER — AMPHETAMINE-DEXTROAMPHETAMINE 5 MG PO TABS: 5.0000 mg | ORAL_TABLET | Freq: Every day | ORAL | Status: DC

## 2014-07-16 MED ORDER — AMPHETAMINE-DEXTROAMPHET ER 20 MG PO CP24: 40.0000 mg | ORAL_CAPSULE | ORAL | Status: DC

## 2014-07-16 NOTE — Telephone Encounter (Signed)
Called patient to make sure that she brings in her machine for download, she said that she can bring later, already on way. Called [facility] to fax over a recent download (spoke with April) and she said that she will have support team fax over a recent download.

## 2014-07-16 NOTE — Patient Instructions (Addendum)
Please continue using your CPAP regularly. While your insurance requires that you use CPAP at least 4 hours each night on 70% of the nights, I recommend, that you not skip any nights and use it throughout the night if you can. Getting used to CPAP and staying with the treatment long term does take time and patience and discipline. Untreated obstructive sleep apnea when it is moderate to severe can have an adverse impact on cardiovascular health and raise her risk for heart disease, arrhythmias, hypertension, congestive heart failure, stroke and diabetes. Untreated obstructive sleep apnea causes sleep disruption, nonrestorative sleep, and sleep deprivation. This can have an impact on your day to day functioning and cause daytime sleepiness and impairment of cognitive function, memory loss, mood disturbance, and problems focussing. Using CPAP regularly can improve these symptoms.  We will get a more recent CPAP compliance download.   We will keep you on the same medications.   You have an appointment with Dr. Leonides CaveZelson next month. We will call you once I have his report.   I will see you back in about 4 months.

## 2014-07-16 NOTE — Telephone Encounter (Signed)
Called and requested the records /notes for patient , will fax to our office ov notes , surgery notes have not been scanned in as of yet.
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
## 2014-07-25 ENCOUNTER — Encounter: Payer: Self-pay | Admitting: Neurology

## 2014-07-29 ENCOUNTER — Telehealth: Payer: Self-pay | Admitting: Neurology

## 2014-07-29 DIAGNOSIS — G4733 Obstructive sleep apnea (adult) (pediatric): Secondary | ICD-10-CM

## 2014-07-29 DIAGNOSIS — Z9989 Dependence on other enabling machines and devices: Principal | ICD-10-CM

## 2014-07-29 NOTE — Telephone Encounter (Signed)
Order placed for new CPAP machine

## 2014-07-29 NOTE — Telephone Encounter (Addendum)
Pt came by the sleep lab for her chip to be downloaded for compliance data.  She would like to pursue possible getting a new CPAP machine since the one that she has now does not have AHI nor leak information.  Pt also believes that the CPAP machine that she has now was was obtained under different insurance which would possibly qualify her for the new equipment.  Requests order for new CPAP

## 2014-07-29 NOTE — Progress Notes (Signed)
Quick Note:  I reviewed the patient's CPAP compliance data from 06/25/2014 through 07/24/2014 which is a total of 30 days during which time the patient used her machine every night, percent used days greater than 4 hours was 93.3%, indicating excellent compliance, average usage of 6 hours and 13 minutes. I have recently seen the patient in follow-up and we will see her routinely in clinic for follow-up. The patient is doing well. Huston FoleySaima Om Lizotte, MD, PhD [facility]  ______

## 2014-08-08 DIAGNOSIS — R413 Other amnesia: Secondary | ICD-10-CM

## 2014-08-15 ENCOUNTER — Ambulatory Visit: Payer: 59 | Attending: Psychology | Admitting: Psychology

## 2014-08-15 DIAGNOSIS — F09 Unspecified mental disorder due to known physiological condition: Secondary | ICD-10-CM

## 2014-08-15 NOTE — Progress Notes (Addendum)
Follow-Up Contact Note  Name: [name] [name] MRN: [id number] Date: 08/15/2014  [name] [name] is an 49 y.o. right handed female who was referred for neuropsychological evaluation by [name] Foley, MD due to problems with cognitive functioning.   Neuropsychological evaluation was begun on 08/08/14 and completed today. A total of 3 hours was spent today administering and scoring neurocognitive tests (CPT: 96118/96119/96120),  discussing the conclusions and recommendations from this evaluation with [name] [name], and preparing a written report.  There were no concerns expressed or behaviors displayed by [name] [name] that would require immediate attention.   A full report will follow.   [name] [name], Ph.D 08/15/2014   [name] [name], Ph.D [name]  [address]   Telephone [phone] [address] Fax [phone] [place], [place] [address]   NEUROPSYCHOLOGICAL EVALUATION     *CONFIDENTIAL* This report should not be released without the consent of the client  Name:   [name] [name] Date of Birth:  08/24/1965 [facility] MR#:  [id number] Dates of Evaluation: 08/08/14 & 08/15/14       . Reason for Referral [name] [name] [name] is a 49 year-old woman of mixed handedness who was referred for neuropsychological evaluation by [name] Guile, NP and [name] Foley, MD of [name] [name]. [name] [name] has a past history of daytime somnolence associated with obstructive sleep apnea. Despite [name] [name]'s report of much improved daytime alertness subsequent to initiation of psychostimulant and wakefulness medications by Dr. Frances Furbish in 2014, she continues to report problems with concentration, short-term memory and multitasking. A brain MRI scan on 01/01/14 was normal.  Sources of Information Medical Records available from [facility] electronic medical records were reviewed. [name] [name] was interviewed.  Chief Complaints [name] [name]  reported an approximate eighteen month history of cognitive difficulties that have neither worsened nor improved.   She reported that she had previously experienced mild difficulties with concentration in 2012 that did not disrupt her everyday functioning. She reported that her mental sharpness improved after she began using a CPAP device in December 2012 following a diagnosis of sleep apnea. 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 She initially attributed her cognitive difficulties to issues with daytime somnolence and work-related stress.  After she began taking a combination of psychostimulant and wakefulness medications in August 2014, her daytime wakefulness markedly improved though her  cognitive difficulties persisted.    She reported that for the first time in her life she was given verbal warnings in September 2015 by her supervisor regarding below expected job performance, which she attributed to not recalling work procedures that she has known for many years. She gave other recent examples of forgetting to pick up her cat from the veterinarian before closing, getting off the elevator at the condominium she has lived at for the past three years and feeling uncertain as to which way to go and forgetting how to reprogram her insulin pump despite having done so many times before. She did not report being distractible, preoccupied or in pain.  With regards to her physical and somatic functioning, she did not report problems with unilateral limb weakness, dizziness, pain,  vision, hearing, speech, swallowing or appetite. Currently, she reports that she sleeps soundly at night, wakes up feeling refreshed and has no problems maintaining daytime wakefulness.  She described herself as usually in good spirits and free of undue  anxiety. She did cite periods of feeling worried and low in mood  in reaction to her cognitive difficulties and work-related difficulties though otherwise did not report experiencing clinically significant affective distress, problems with mood regulation or difficulties with psychosocial adjustment. She described herself as being mostly satisfied with her life.    Background She is of African-American, American [place] and Caucasian heritage.   She reported no history of childhood illness or injuries. She reported that she attained her developmental milestones within normal timeframes. She reported that she has always been left-hand dominant for writing though has consistently used her right hand to use utensils, throw and pick up objects.  She has been living alone for the past six years. She was divorced from her ex-husband in 11/02/08 after seven years of marriage. She does not have children.  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 She reported no history of job termination due to performance issues.  She reported that she has completed the equivalent of three years of college. After graduating from high school, she matriculated at [employer] on scholarship to study Patent attorney. During her second year, her diabetes became uncontrolled and she was compelled to drip out. 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 She reported that she did not complete her degree as she started working fulltime. She last attended [employer] in 11/03/2006 bit dropped out due to marital problems. She reported that she had earned excellent grades throughout her college career. She reported no history school-based attentional or learning problems.  Her past  medical history was notable for Diabetes Mellitus Type I (diagnosed at age 79), herpes simplex virus, hypothyroidism, obstructive sleep apnea (diagnosed in 2010-11-03 and shortly thereafter began using CPAP). She underwent an anterior cervical discectomy in December 2015 without subsequent complications.   Her current medications include alprazolam as needed, amphetamine-dextroamphetamine (Adderall XR plus Adderall immediate release as needed), armodafilnil, bupropion, escitalopram, Premarin, Humalog insulin, levothyroxine and valacyclovir.  She reported no history of head injury, seizures, stroke-like symptoms, neurotoxin exposure or substance abuse. She was diagnosed with meningitis in 11/02/2001 without report of any residual post-treatment problems.   She reported no family history of memory disorder, dementia or psychiatric disorder.   With regards to her mental health history, as noted above she has experienced periodic mild anxiety and depression that has never comprised her ability to function at home or at work. She reported no history of disabling mood disturbance, mood instability, suicidal behavior or psychotic symptoms. She reported no history of being abused or traumatized. She reported that she was first prescribed antidepressant medication in 11/03/98 shortly after the death of her father. In 11/02/09, she consulted with a psychiatrist for one visit with a chief complaint of work stress; and she was prescribed bupropion and citalopram. She also began working with a Medical illustrator in 11/03/10 until terminating services after she moved to [place] in 11-03-11.      She reported no history of legal issues or workers Water engineer.  Observations She appeared as an appropriately groomed and dressed slender woman in no apparent physical distress. She appeared consistently awake and alert. She did not exhibit any other unusual motor activity or mannerisms. She interacted in a pleasant manner. She  spoke  in a normal tone of voice, maintained good eye contact and responded to all questions. Her affect was well modulated and appeared mostly positive. She did not display signs of emotional distress.  Her speech was well articulated and of normal prosody. Her word usage and word retrieval were normal. She had no apparent problems understanding what was said to her. Her thought processes were coherent and organized without loose associations, verbal perseverations or flight of ideas. There were no indications of unusual thought content or delusional thinking.  Tests Administered Animal Naming Test  [facility] Depression Inventory-II  [place] Naming Test Controlled Oral Word Association Test Finger Tapping Hand Dynamometer Multidimensional Health Profile-Psychosocial Functioning  Rey Complex Figure: Copy Rey 15-Item Memory Test [employer] Figural Fluency Test Stroop Color and Word test Trail Making A & B Wechsler Adult Intelligence Scale-IV Wechsler Memory Scale-IV Wide Range Achievement Test-4: Word Reading Wisconsin Card Sorting Test  Assessment Results Test Behavior & Interpretative Considerations  She was cooperative in all aspects of the testing process. She described herself as feeling fully awake and alert throughout both testing sessions. She did not exhibit any signs of physical or emotional discomfort. She did not report or display problems with vision (she wore her eyeglasses), hearing or motor control.  She seemed to grasp test directions without difficulty. There were no indications of overly deliberate, perseverative or careless responding.  She appeared to try her best. Assessment of her test-taking effort using a formal validity measure (Rey 15-Item Memory Test) suggested that she expended optimal effort as she immediately reproduced all fifteen symbols from immediate memory.  It was concluded that the test results represented a valid measure of her cognitive functioning with two caveats.  It is worth noting that she took her prescribed dosages of Adderall and Nuvigil on both testing days. In addition, her blood glucose monitoring device alarm went off several times during the 08/08/14 session though infrequently during the 08/15/14 session.  Her pre-morbid level of intellectual functioning was estimated to fall within the Average range based on demographic factors coupled with a measure of her ability to read words (Wide Range Achievement Test-4), which represents an over-learned verbal skill that is relatively resistant to the effects of neurological injury or illness.  Her test scores were corrected to reflect norms for her age and, whenever possible, her gender and educational level (i.e., 15 years). Please see the attached sheet for test scores.  Intellectual Functioning Her overall intellectual functioning fell at the lower boundary of the Average range on the Wechsler Adult Intelligence Scale-IV (WAIS-IV) as she obtained a Full Scale IQ (FSIQ) of 64, which corresponded to the 25th percentile compared to same-aged peers. Her intellectual skills were unevenly demonstrated across the cognitive domains assessed. Her Verbal Comprehension index, a composite measure of her acquired fund of verbal knowledge and verbal reasoning ability, fell within the Average range with equivalent performances across subtests. In contrast, her Perceptual Reasoning Index, a composite measure of visual-spatial organizational ability and nonverbal reasoning, was a relative weakness within the Borderline range. She demonstrated variability amongst the subtests that comprised this index as her ability to mentally reconstruct spatially complex puzzles (Visual Puzzles) was within the Average range, her ability to assemble two-dimensional block designs from models Aeronautical engineer) was within the Low Average range and her performance to use nonverbal reasoning to match designs in an abstract pattern (Matrix Reasoning)  fell within the Extremely Low range. Her Working Memory Index comprised of tasks that required her to temporarily hold and  manipulate auditory information within short-term memory, fell within Low Average range. These subtests required her to solve mental calculations (Arithmetic) or repeat and mentally rearrange digit sequences (Digit Span). Her Processing Speed index, a measure of her speed to process simple or routine visual information, fell within the Average range.  Attention & Memory As noted above, she performed within the Average range on WAIS-IV tests of processing speed that required transcription of symbols to match digits using a key (WAIS-IV Coding) or discrimination of similarities and differences amongst sets of geometric symbols (WAIS-IV Symbol Search). In contrast, her speeds to draw lines to connect numbers randomly arrayed on a page in sequence (Trails A) or to name color hues (Stroop Color trial) were severely slowed.  Her performances on tasks within the auditory channel that required sustained concentration and working memory capacity varied from the Low Average to Average ranges. These tasks required her to repeat spoken digits in forward, reverse or ascending sequence (WAIS-IV Digit Span) or solve mental calculations (WAIS-IV Arithmetic). She performed within the Average range on visual working memory tasks that required immediate recognition of symbols in left to right order (Wechsler Memory Scale-IV (WMS-IV) Symbol Span) or immediate recall of spatial locations within a grid (WMS-IV Spatial Addition).  Assessment of her memory functioning indicated normal acquisition but with a high rate of forgetting. Her Immediate Memory Index (IMI) on the WMS-IV, a measure of her ability to recall verbal and visual information immediately after the stimuli was presented, fell within the Average range. Her Delayed Memory Index (DMI), a combined measure of her ability to recall verbal and visual  information after a 20 to 30 minute delay, declined to the Mildly Impaired range. Her DMI was significantly lower than her IMI, which indicated a high rate of forgetting of initially learned information. Her delayed recall was not aided on a recognition format that provided prompts or multiple choices, a finding that indicated a problem with memory storage. There were no differences between her abilities to encode and retain auditory versus visual information.   Executive Function  Her executive functioning was assessed through a variety of tasks that required mental flexibility, organization, reasoning and/or planning skills.  She performed extremely slowly on a set shifting task that required her to maintain an ascending and alternating sequence of letters and numbers (Trails B). Moreover, she deviated from the correct sequence three times. Her performance on this task might have been reduced by her slowed visual scanning efficiency as evident on the Trails A test.   On a test that required selective attention and response inhibition in order to name a print color that did not match the written word (Stroop Color Word Test), her speed on the interference trial as well as her speed to name color hues were both within the subnormal range. Her word reading score was normal. This pattern did not clearly reflect a loss of cognitive flexibility but rather suggested a primary deficit for color naming efficiency.   Measures of verbal productivity, which are considered indicators of cognitive flexibility, were within the Average range. Verbal fluency was measured by her abilities to name members of a category (Animal Naming Test) or to generate words to designated letters (Controlled Oral Word Association Test).  Nonverbal fluency, as measured on a test (Ruff Figural Fluency Test) that required her to draw as many unique designs as possible within a set time period, was within the Low Average range. From a  qualitative perspective, she did not use consistent strategies.  She struggled to copy a spatially complex geometric design Forensic psychologist(Rey Complex Design) as she required three attempts to draw it and needed a longer than typical amount of time (i.e., 15 minutes). This pattern suggested difficulty in the area of spatial planning and organization.  Measures of conceptual reasoning were mixed. On the one hand, she demonstrated normal ability to form abstract verbal concepts by classifying ostensibly different objects or ideas to a shared category (WAIS-IV Similarities). In contrast, her performance on a test that required organization and logical reasoning of spatial patterns in order to match designs or symbols in an abstract manner (WAIS-IV Matrix Reasoning) fell within the Extremely Low Range at the 1st percentile . She was only able to complete items that required simple pattern matching.   She performed within normal expectations on a novel problem-solving test that required rule acquisition, conceptual flexibility and set shifting [employer](Wisconsin Card Sorting Test). She completed all six categories, lost set only one time and efficiently switched to a new matching concept after the previously correct concept changed without warning.  Language Her ability to name to confrontation [facility](Boston [name] Mutualaming Test) was below expectations at the lower boundary of the Low Average range. For example, she referred to a drawing of dominoes as "dice" and a funnel as a "sieve". Her phonemic fluency (Controlled Oral Word Association Test) fell within the Average range. Her ability to read words (Wide Range Achievement Test-4) fell within the Average range.   Visual Perceptual & Visuospatial Construction There were no signs of spatial inattention or problems with visual recognition. Her scores on tests of visuospatial organization varied from the Low Average to Average ranges based on her assembly of two- dimensional block designs from  models (WAIS- IV Block Design)and mental reconstruction of fragmented designs to match a whole (WAIS-IV Visual Puzzles). As noted above, she required three attempts and a much longer than typical amount of time to adequately draw a complex geometric design Forensic psychologist(Rey Complex Design).  Motor She stated that she is of mixed handedness. She has always used her left hand for writing though has consistently preferred her right hand to use utensils, throw and pick up objects. There were no signs of tremor or gross disscoordination. On a measure of fine motor speed (Finger Tapping), her right hand was nearly 20% faster than her left hand. Both measures were within normal limits. Her mixed handedness precluded valid interpretation of this right-left discrepancy.  Emotional Status The Multidimensional Health Profile-Psychosocial Functioning is a 58-item questionnaire that assesses perceived life stress, coping skills, social resources and psychological distress. She reported having experienced only one stressful event (work-related stress) within the past year that she rated as being moderately stressful.  She cited some dissatisfaction with the emotional support available to her from family and friends though reported mostly positive interactions with them. While her overall level of global psychological distress fell within the average range, her scores were moderately elevated on scales related to somatic complaints and cognitive disturbance. She rated herself as feeling mostly being satisfied with her life overall.    On the Beck Depression Inventory-II, a 21-item inventory listing symptoms of depression, her score of 6 fell within the non-depressed range. Her most prominent symptom (i.e., 2 or 3 on a 0 - 3 scale) was much more difficulty making decisions. Of note, she did not endorse having suicidal thoughts.    Summary & Conclusions Ms. Nashali Fedak's neuropsychological test profile was abnormal, primarily due  to impairments for memory storage and nonverbal executive  functioning. With regards to memory, while her abilities to encode and learn new information were normal, she demonstrated significant forgetting of both auditory and visual information after a delay. Her deficiencies for executive functioning were evident on nonverbal tests that required visual scanning efficiency, mental flexibly, divergent thinking, spatial organization/planning or logical reasoning. Her performances on tests that required sustained concentration or focused attention were within normal limits though perhaps slightly lower than expected based on her estimated pre-morbid level. Her language functioning was normal with the exception of lower than expected ability to name to confrontation. Finally, her right hand fine motor speed was atypically nearly 20% faster than her right hand, though her mixed handedness precluded valid interpretation of this discrepancy.   There were no signs of sub-optimal effort on testing. She both appeared and reported feeling fully awake and alert throughout both testing sessions. It is worth noting that her blood glucose monitoring device alarm went off several times during the 08/08/14 testing session (when the bulk of testing was completed) but infrequently during the 08/15/14 session.  Assessment of her psychological status indicated that she did not report experiencing any clinically significant affective distress or difficulties with psychosocial adjustment at this time. She did report having been troubled over the past several months by her sense of cognitive compromise as well as ongoing work-related stress, however. She described herself as overall mostly satisfied with her life.  In conclusion, her neuropsychological profile was difficult to explain. A recent brain MRI scan was normal. She reported no history of head injury, attention deficit, learning disability, substance abuse or serious  psychiatric disorder. Her past history of daytime somnolence, which she claimed has resolved, was not deemed to be relevant as she appeared fully awake and alert throughout testing. While sleep apnea can produce cognitive decrement, her cognitive deficits far exceeded what might be expected on the basis of that condition. Likewise, her neurocognitive profile does not appear to be explained by the typical effects of stress or emotional symptomology. In any event, she reported experiencing only a minimal degree of affective distress at this time. One could consider the effects of her Diabetes Type 1 as studies have shown that many adults with Diabetes Type I show modest cognitive deficits on a wide range of neuropsychological tests, perhaps due to repetitive episodes of moderate to severe hypoglycemia or hyperglycemia . As yet, there is no homogenous pattern of cognitive deficits associated with Diabetes or a clearly defined level of severity of cognitive dysfunction, however. Finally, while an early onset neurodegenerative disorder is unlikely it cannot be completely excluded.   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 She should return to her neurologist for consideration of further work-up given signs of cerebral dysfunction on neuropsychological evaluation.  2. She reported that in September 2015 she was given a verbal warning at work regarding below expected job performance, which she attributed to not recalling work procedures that she has known for many years. Given that her job requires attention to detail, high-level decision making and problem solving, it is recommended that she consider either transitioning into a job with lower cognitive demands if available or go on short-term disability due to her cognitive deficits.  3. The current test results have value as a baseline of her neurocognitive functioning. A repeat neuropsychological evaluation could be  considered in the future as a means to track her neurocognitive functioning.  The conclusions and recommendations from this evaluation were discussed in depth with her on 08/15/14. She  appeared distressed by the feedback about her cognitive deficits. In addition, she expressed disappointment that none of her test scores exceeded the Average range as she has always considered herself to be of above average intelligence.   We have appreciated the opportunity to evaluate Ms. Poullard. Please feel free to contact me with any comments or questions.   ___________________ [name] [name], Ph.D               Licensed Psychologist                   ADDENDUM-NEUROPSYCHOLOGICAL TEST RESULTS  Name:   [name] [name] Date of Birth:  April 30, 1966 [facility] MR#:  [id number] Dates of Evaluation: 08/08/14 & 08/15/14         Animal Naming Test Score= 24    62nd (adjusted for age, gender and educational level)   Merchandiser, retail Score= 53/60    10th (adjusted for age, gender and educational level)   Controlled Oral Word Association Test 42 words/2 repetitions  46th (adjusted for age, gender and educational level)   Finger Tapping  (scored assuming left hand dominance) L: 41     18th (adjusted for age, gender and educational level) R: 36     84th (adjusted for age, gender and educational level)   Rey Complex Figure: copy   required three attempts and longer than typical time (i.e., 15 minutes) to complete a normal drawing    Rey 15-Item Memory Test  Score= 15/15   normal      Ruff Figural Fluency Test Total Unique Designs= 75  21st (adjusted for age and educational level)   Stroop Test    Score   Residual      percentile  Word=   111      6  66th          (adjusted for age and educational level)  Color=     46      -31  <1st                 Color-Word=   23  -18    4th                   Trails A   61s  0e      1st (adjusted for age, gender, race and educational level) Trails B    236s 3e              <1st (adjusted for age, gender, race and educational level)   Wechsler Adult Intelligence Scale-IV (WAIS-IV)  Scale       composite score       % rank              Verbal Comprehension   102  55th        Similarities      9   25th    Vocabulary    12      75th   Information     10      50th                Perceptual Reasoning     77     6th                               Block Design      7  16th     Matrix Reasoning 3  1st   Visual Puzzles     8   25th             Working Memory       89  23rd                                      Digit Span      7  16th  Arithmetic      9     37th      Processing Speed      102  55th         Symbol Search   11   63rd     Coding              10  50th                Full Scale IQ       90  25th          Wechsler Memory Scale-IV   Index                         Index Score           Percentile               Immediate Memory   102   55th                  Auditory Memory    90   25th                              Visual Memory    90   25th                            Delayed Memory    76     5th  Visual Working Memory   97   42nd    Wide Range Achievement Test-4  Subtest             raw score       standard score  %rank  Word Reading   63/70    103     58th    Wisconsin Card Sorting Test    Total errors=        16    53rd (adjusted for age and educational level)    Perseverative responses=    9     47th                           Categories=            6            >16th                  Trials to first category=     10            >16th                  Failure to maintain set=                  1            >16th  Learning to Learn=   -1.82            >16th

## 2014-09-10 ENCOUNTER — Ambulatory Visit (INDEPENDENT_AMBULATORY_CARE_PROVIDER_SITE_OTHER): Payer: 59 | Admitting: Neurology

## 2014-09-10 ENCOUNTER — Encounter: Payer: Self-pay | Admitting: Neurology

## 2014-09-10 VITALS — BP 115/72 | HR 97 | Temp 97.1°F | Resp 16 | Ht 66.0 in | Wt 125.0 lb

## 2014-09-10 DIAGNOSIS — R4189 Other symptoms and signs involving cognitive functions and awareness: Secondary | ICD-10-CM | POA: Diagnosis not present

## 2014-09-10 DIAGNOSIS — G4711 Idiopathic hypersomnia with long sleep time: Secondary | ICD-10-CM | POA: Diagnosis not present

## 2014-09-10 DIAGNOSIS — G4733 Obstructive sleep apnea (adult) (pediatric): Secondary | ICD-10-CM | POA: Diagnosis not present

## 2014-09-10 DIAGNOSIS — R413 Other amnesia: Secondary | ICD-10-CM | POA: Diagnosis not present

## 2014-09-10 DIAGNOSIS — M5020 Other cervical disc displacement, unspecified cervical region: Secondary | ICD-10-CM | POA: Diagnosis not present

## 2014-09-10 DIAGNOSIS — Z9889 Other specified postprocedural states: Secondary | ICD-10-CM | POA: Diagnosis not present

## 2014-09-10 DIAGNOSIS — R9389 Abnormal findings on diagnostic imaging of other specified body structures: Secondary | ICD-10-CM

## 2014-09-10 DIAGNOSIS — R938 Abnormal findings on diagnostic imaging of other specified body structures: Secondary | ICD-10-CM | POA: Diagnosis not present

## 2014-09-10 DIAGNOSIS — Z9989 Dependence on other enabling machines and devices: Secondary | ICD-10-CM

## 2014-09-10 MED ORDER — AMPHETAMINE-DEXTROAMPHETAMINE 5 MG PO TABS: 5.0000 mg | ORAL_TABLET | Freq: Every day | ORAL | Status: DC

## 2014-09-10 MED ORDER — AMPHETAMINE-DEXTROAMPHET ER 20 MG PO CP24: 40.0000 mg | ORAL_CAPSULE | ORAL | Status: DC
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
## 2014-09-10 NOTE — Patient Instructions (Signed)
We will do an EEG, and blood work.  We will repeat cognitive testing next year.  I will write a letter of support for your job.  We will monitor you closely.

## 2014-09-11 NOTE — Progress Notes (Signed)
Quick Note:  Please call patient, so far blood test results were fine with the exception of low vitamin D. I would like for her to start an over-the-counter vitamin D supplement at 2000 units daily and also follow-up with her primary care physician for ongoing management of vitamin D deficiency. A couple of things are still pending and if abnormal we will call her. This includes vitamin B-1 and vitamin B6 which are not yet back. Huston FoleySaima Ashaya Raftery, MD, PhD [facility] ([facility])  ______

## 2014-09-12 ENCOUNTER — Telehealth: Payer: Self-pay

## 2014-09-12 NOTE — Telephone Encounter (Signed)
[name] [name] is aware of results. [facility] to [facility].

## 2014-09-12 NOTE — Telephone Encounter (Signed)
-----   Message from Huston FoleySaima Athar, MD sent at 09/11/2014  1:57 PM EDT ----- Please call patient, so far blood test results were fine with the exception of low vitamin D. I would like for her to start an over-the-counter vitamin D supplement at 2000 units daily and also follow-up with her primary care physician for ongoing management of vitamin D deficiency. A couple of things are still pending and if abnormal we will call her. This includes vitamin B-1 and vitamin B6 which are not yet back. Huston FoleySaima Athar, MD, PhD [facility])

## 2014-09-13 LAB — VITAMIN B6: Vitamin B6: 28.4 ug/L (ref 2.0–32.8)

## 2014-09-13 LAB — RPR: RPR Ser Ql: NONREACTIVE

## 2014-09-13 LAB — HOMOCYSTEINE: Homocysteine: 8.9 umol/L (ref 0.0–15.0)

## 2014-09-13 LAB — METHYLMALONIC ACID, SERUM: Methylmalonic Acid: 167 nmol/L (ref 0–378)

## 2014-09-13 LAB — C-REACTIVE PROTEIN: CRP: 0.5 mg/L (ref 0.0–4.9)

## 2014-09-13 LAB — ANA W/REFLEX: Anti Nuclear Antibody(ANA): NEGATIVE

## 2014-09-13 LAB — B12 AND FOLATE PANEL
Folate: 12.4 ng/mL (ref >3.0)
Vitamin B-12: 884 pg/mL (ref 211–946)

## 2014-09-13 LAB — RHEUMATOID FACTOR: Rhuematoid fact SerPl-aCnc: 10.9 IU/mL (ref 0.0–13.9)

## 2014-09-13 LAB — VITAMIN B1: Thiamine: 117.1 nmol/L (ref 66.5–200.0)

## 2014-09-13 LAB — VITAMIN D 25 HYDROXY (VIT D DEFICIENCY, FRACTURES): Vit D, 25-Hydroxy: 8.2 ng/mL — ABNORMAL LOW (ref 30.0–100.0)

## 2014-09-13 NOTE — Addendum Note (Signed)
Addended by: Huston FoleyATHAR, Shaquoya Cosper on: 09/13/2014 01:26 PM   Modules accepted: Level of Service

## 2014-09-18 ENCOUNTER — Ambulatory Visit (INDEPENDENT_AMBULATORY_CARE_PROVIDER_SITE_OTHER): Payer: 59 | Admitting: Neurology

## 2014-09-18 DIAGNOSIS — G4711 Idiopathic hypersomnia with long sleep time: Secondary | ICD-10-CM

## 2014-09-18 DIAGNOSIS — R413 Other amnesia: Secondary | ICD-10-CM | POA: Diagnosis not present

## 2014-09-18 DIAGNOSIS — R4189 Other symptoms and signs involving cognitive functions and awareness: Secondary | ICD-10-CM

## 2014-09-18 DIAGNOSIS — M5020 Other cervical disc displacement, unspecified cervical region: Secondary | ICD-10-CM

## 2014-09-18 DIAGNOSIS — G4733 Obstructive sleep apnea (adult) (pediatric): Secondary | ICD-10-CM

## 2014-09-18 DIAGNOSIS — Z9889 Other specified postprocedural states: Secondary | ICD-10-CM

## 2014-09-18 DIAGNOSIS — Z9989 Dependence on other enabling machines and devices: Secondary | ICD-10-CM

## 2014-09-18 DIAGNOSIS — R9389 Abnormal findings on diagnostic imaging of other specified body structures: Secondary | ICD-10-CM

## 2014-09-18 NOTE — Procedures (Signed)
    History:  [name] SimasDemetria Boller is a 49 year old patient with a history of sleep apnea, anxiety and depression, and some reports of memory and cognitive impairment. The patient is having difficulty maintaining her job activities. The patient is being evaluated for the change in cognitive functioning.  This is a routine EEG. No skull defects are noted. Medications include alprazolam, Adderall, Wellbutrin, Lexapro, insulin, Synthroid, Nuvigil, and Valtrex.   EEG classification: Normal awake  Description of the recording: The background rhythms of this recording consists of a fairly well modulated medium amplitude alpha rhythm of 11 Hz that is reactive to eye opening and closure. Throughout the recording, and overlying beta frequency activity is seen that is generalized in nature. As the record progresses, the patient appears to remain in the waking state throughout the recording. Photic stimulation was performed, resulting in a bilateral and symmetric photic driving response. Hyperventilation was also performed, resulting in a minimal buildup of the background rhythm activities without significant slowing seen. At no time during the recording does there appear to be evidence of spike or spike wave discharges or evidence of focal slowing. EKG monitor shows no evidence of cardiac rhythm abnormalities with a heart rate of 102.  Impression: This is a normal EEG recording in the waking state. No evidence of ictal or interictal discharges are seen. The beta activity seen throughout the recording may be related to medication effect such as benzodiazepines, the patient is on alprazolam.

## 2014-09-19 ENCOUNTER — Telehealth: Payer: Self-pay

## 2014-09-19 NOTE — Telephone Encounter (Signed)
Left message with EEG results and left call back number for any further questions.

## 2014-09-19 NOTE — Telephone Encounter (Signed)
-----   Message from Huston FoleySaima Athar, MD sent at 09/19/2014  9:58 AM EDT ----- Please call and advise the patient that the EEG or brain wave test we performed was reported as normal in the awake state. We checked for abnormal electrical discharges in the brain waves and the report suggested normal findings. No further action is required on this test at this time. Please remind patient to keep any upcoming appointments or tests and to call us with any interim questions, concerns, problems or updates. Thanks,  Huston FoleySaima Athar, MD, PhD

## 2014-09-19 NOTE — Progress Notes (Signed)
Quick Note:  Please call and advise the patient that the EEG or brain wave test we performed was reported as normal in the awake state. We checked for abnormal electrical discharges in the brain waves and the report suggested normal findings. No further action is required on this test at this time. Please remind patient to keep any upcoming appointments or tests and to call us with any interim questions, concerns, problems or updates. Thanks,  Rishawn Walck, MD, PhD    ______ 

## 2014-10-04 ENCOUNTER — Emergency Department (HOSPITAL_COMMUNITY): Payer: No Typology Code available for payment source

## 2014-10-04 ENCOUNTER — Emergency Department (HOSPITAL_COMMUNITY)
Admission: EM | Admit: 2014-10-04 | Discharge: 2014-10-04 | Disposition: A | Discharge status: Home / Self Care | Payer: No Typology Code available for payment source | Attending: Emergency Medicine | Admitting: Emergency Medicine

## 2014-10-04 ENCOUNTER — Encounter (HOSPITAL_COMMUNITY): Payer: Self-pay | Admitting: Emergency Medicine

## 2014-10-04 DIAGNOSIS — E039 Hypothyroidism, unspecified: Secondary | ICD-10-CM | POA: Insufficient documentation

## 2014-10-04 DIAGNOSIS — Z794 Long term (current) use of insulin: Secondary | ICD-10-CM | POA: Diagnosis not present

## 2014-10-04 DIAGNOSIS — S199XXA Unspecified injury of neck, initial encounter: Secondary | ICD-10-CM | POA: Insufficient documentation

## 2014-10-04 DIAGNOSIS — Z8661 Personal history of infections of the central nervous system: Secondary | ICD-10-CM | POA: Insufficient documentation

## 2014-10-04 DIAGNOSIS — Y9389 Activity, other specified: Secondary | ICD-10-CM | POA: Diagnosis not present

## 2014-10-04 DIAGNOSIS — Z8669 Personal history of other diseases of the nervous system and sense organs: Secondary | ICD-10-CM | POA: Insufficient documentation

## 2014-10-04 DIAGNOSIS — Z880 Allergy status to penicillin: Secondary | ICD-10-CM | POA: Insufficient documentation

## 2014-10-04 DIAGNOSIS — Y998 Other external cause status: Secondary | ICD-10-CM | POA: Diagnosis not present

## 2014-10-04 DIAGNOSIS — Z8742 Personal history of other diseases of the female genital tract: Secondary | ICD-10-CM | POA: Insufficient documentation

## 2014-10-04 DIAGNOSIS — Y92410 Unspecified street and highway as the place of occurrence of the external cause: Secondary | ICD-10-CM | POA: Insufficient documentation

## 2014-10-04 DIAGNOSIS — S4992XA Unspecified injury of left shoulder and upper arm, initial encounter: Secondary | ICD-10-CM | POA: Diagnosis not present

## 2014-10-04 DIAGNOSIS — E119 Type 2 diabetes mellitus without complications: Secondary | ICD-10-CM | POA: Diagnosis not present

## 2014-10-04 DIAGNOSIS — Z79899 Other long term (current) drug therapy: Secondary | ICD-10-CM | POA: Insufficient documentation

## 2014-10-04 DIAGNOSIS — Z8619 Personal history of other infectious and parasitic diseases: Secondary | ICD-10-CM | POA: Diagnosis not present

## 2014-10-04 DIAGNOSIS — M545 Low back pain: Secondary | ICD-10-CM

## 2014-10-04 DIAGNOSIS — S3992XA Unspecified injury of lower back, initial encounter: Secondary | ICD-10-CM | POA: Diagnosis present

## 2014-10-04 DIAGNOSIS — M79602 Pain in left arm: Secondary | ICD-10-CM

## 2014-10-04 DIAGNOSIS — M542 Cervicalgia: Secondary | ICD-10-CM

## 2014-10-04 MED ORDER — KETOROLAC TROMETHAMINE 60 MG/2ML IM SOLN
60.0000 mg | Freq: Once | INTRAMUSCULAR | Status: AC
  Administered 2014-10-04: 60 mg via INTRAMUSCULAR
  Filled 2014-10-04: qty 2
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
## 2014-10-04 NOTE — Discharge Instructions (Signed)
Return to the emergency room with worsening of symptoms, new symptoms or with symptoms that are concerning , especially severe worsening of headache, visual or speech changes, weakness in face, arms or legs OR , especially fevers, loss of control of bladder or bowels, numbness or tingling around genital region or anus, weakness. RICE: Rest, Ice (three cycles of 20 mins on, off at least twice a day), compression/brace, elevation. Heating pad works well for back pain. Ibuprofen  (2 tablets ) every 5-6 hours for 3-5 days. Follow up with PCP/your orthopedist/ neurosystem if symptoms worsen or are persistent. Read below information and follow recommendations. Cervical Sprain A cervical sprain is an injury in the neck in which the strong, fibrous tissues (ligaments) that connect your neck bones stretch or tear. Cervical sprains can range from mild to severe. Severe cervical sprains can cause the neck vertebrae to be unstable. This can lead to damage of the spinal cord and can result in serious nervous system problems. The amount of time it takes for a cervical sprain to get better depends on the cause and extent of the injury. Most cervical sprains heal in 1 to 3 weeks. CAUSES  Severe cervical sprains may be caused by:   Contact sport injuries (such as from football, rugby, wrestling, hockey, auto racing, gymnastics, diving, martial arts, or boxing).   Motor vehicle collisions.   Whiplash injuries. This is an injury from a sudden forward and backward whipping movement of the head and neck.  Falls.  Mild cervical sprains may be caused by:   Being in an awkward position, such as while cradling a telephone between your ear and shoulder.   Sitting in a chair that does not offer proper support.   Working at a poorly Marketing executive station.   Looking up or down for long periods of time.  SYMPTOMS   Pain, soreness, stiffness, or a burning sensation in the front, back, or  sides of the neck. This discomfort may develop immediately after the injury or slowly, 24 hours or more after the injury.   Pain or tenderness directly in the middle of the back of the neck.   Shoulder or upper back pain.   Limited ability to move the neck.   Headache.   Dizziness.   Weakness, numbness, or tingling in the hands or arms.   Muscle spasms.   Difficulty swallowing or chewing.   Tenderness and swelling of the neck.  DIAGNOSIS  Most of the time your health care provider can diagnose a cervical sprain by taking your history and doing a physical exam. Your health care provider will ask about previous neck injuries and any known neck problems, such as arthritis in the neck. X-rays may be taken to find out if there are any other problems, such as with the bones of the neck. Other tests, such as a CT scan or MRI, may also be needed.  TREATMENT  Treatment depends on the severity of the cervical sprain. Mild sprains can be treated with rest, keeping the neck in place (immobilization), and pain medicines. Severe cervical sprains are immediately immobilized. Further treatment is done to help with pain, muscle spasms, and other symptoms and may include:  Medicines, such as pain relievers, numbing medicines, or muscle relaxants.   Physical therapy. This may involve stretching exercises, strengthening exercises, and posture training. Exercises and improved posture can help stabilize the neck, strengthen muscles, and help stop symptoms from returning.  HOME CARE INSTRUCTIONS   Put ice on the  injured area.   Put ice in a plastic bag.   Place a towel between your skin and the bag.   Leave the ice on for 15-20 minutes, 3-4 times a day.   If your injury was severe, you may have been given a cervical collar to wear. A cervical collar is a two-piece collar designed to keep your neck from moving while it heals.  Do not remove the collar unless instructed by your health  care provider.  If you have long hair, keep it outside of the collar.  Ask your health care provider before making any adjustments to your collar. Minor adjustments may be required over time to improve comfort and reduce pressure on your chin or on the back of your head.  Ifyou are allowed to remove the collar for cleaning or bathing, follow your health care provider's instructions on how to do so safely.  Keep your collar clean by wiping it with mild soap and water and drying it completely. If the collar you have been given includes removable pads, remove them every 1-2 days and hand wash them with soap and water. Allow them to air dry. They should be completely dry before you wear them in the collar.  If you are allowed to remove the collar for cleaning and bathing, wash and dry the skin of your neck. Check your skin for irritation or sores. If you see any, tell your health care provider.  Do not drive while wearing the collar.   Only take over-the-counter or prescription medicines for pain, discomfort, or fever as directed by your health care provider.   Keep all follow-up appointments as directed by your health care provider.   Keep all physical therapy appointments as directed by your health care provider.   Make any needed adjustments to your workstation to promote good posture.   Avoid positions and activities that make your symptoms worse.   Warm up and stretch before being active to help prevent problems.  SEEK MEDICAL CARE IF:   Your pain is not controlled with medicine.   You are unable to decrease your pain medicine over time as planned.   Your activity level is not improving as expected.  SEEK IMMEDIATE MEDICAL CARE IF:   You develop any bleeding.  You develop stomach upset.  You have signs of an allergic reaction to your medicine.   Your symptoms get worse.   You develop new, unexplained symptoms.   You have numbness, tingling, weakness, or  paralysis in any part of your body.  MAKE SURE YOU:   Understand these instructions.  Will watch your condition.  Will get help right away if you are not doing well or get worse. Document Released: 04/11/2007 Document Revised: 06/19/2013 Document Reviewed: 12/20/2012 [facility] Patient Information 2015 [place], [place]. This information is not intended to replace advice given to you by your health care provider. Make sure you discuss any questions you have with your health care provider.  Motor Vehicle Collision It is common to have multiple bruises and sore muscles after a motor vehicle collision (MVC). These tend to feel worse for the first 24 hours. You may have the most stiffness and soreness over the first several hours. You may also feel worse when you wake up the first morning after your collision. After this point, you will usually begin to improve with each day. The speed of improvement often depends on the severity of the collision, the number of injuries, and the location and nature of  these injuries. HOME CARE INSTRUCTIONS  Put ice on the injured area.  Put ice in a plastic bag.  Place a towel between your skin and the bag.  Leave the ice on for 15-20 minutes, 3-4 times a day, or as directed by your health care provider.  Drink enough fluids to keep your urine clear or pale yellow. Do not drink alcohol.  Take a warm shower or bath once or twice a day. This will increase blood flow to sore muscles.  You may return to activities as directed by your caregiver. Be careful when lifting, as this may aggravate neck or back pain.  Only take over-the-counter or prescription medicines for pain, discomfort, or fever as directed by your caregiver. Do not use aspirin. This may increase bruising and bleeding. SEEK IMMEDIATE MEDICAL CARE IF:  You have numbness, tingling, or weakness in the arms or legs.  You develop severe headaches not relieved with medicine.  You have severe neck  pain, especially tenderness in the middle of the back of your neck.  You have changes in bowel or bladder control.  There is increasing pain in any area of the body.  You have shortness of breath, light-headedness, dizziness, or fainting.  You have chest pain.  You feel sick to your stomach (nauseous), throw up (vomit), or sweat.  You have increasing abdominal discomfort.  There is blood in your urine, stool, or vomit.  You have pain in your shoulder (shoulder strap areas).  You feel your symptoms are getting worse. MAKE SURE YOU:  Understand these instructions.  Will watch your condition.  Will get help right away if you are not doing well or get worse. Document Released: 06/14/2005 Document Revised: 10/29/2013 Document Reviewed: 11/11/2010 [facility] Patient Information 2015 [place]. This information is not intended to replace advice given to you by your health care provider. Make sure you discuss any questions you have with your health care provider.

## 2014-10-25 ENCOUNTER — Other Ambulatory Visit: Payer: Self-pay | Admitting: Neurology

## 2014-10-25 DIAGNOSIS — Z9989 Dependence on other enabling machines and devices: Secondary | ICD-10-CM

## 2014-10-25 DIAGNOSIS — G4711 Idiopathic hypersomnia with long sleep time: Secondary | ICD-10-CM

## 2014-10-25 DIAGNOSIS — G4733 Obstructive sleep apnea (adult) (pediatric): Secondary | ICD-10-CM

## 2014-10-25 MED ORDER — AMPHETAMINE-DEXTROAMPHET ER 20 MG PO CP24: 40.0000 mg | ORAL_CAPSULE | ORAL | Status: DC

## 2014-10-25 MED ORDER — AMPHETAMINE-DEXTROAMPHETAMINE 5 MG PO TABS: 5.0000 mg | ORAL_TABLET | Freq: Every day | ORAL | Status: DC

## 2014-10-25 NOTE — Telephone Encounter (Signed)
Request entered, forwarded to provider for approval.  

## 2014-10-25 NOTE — Telephone Encounter (Signed)
Patient called requesting a refill for amphetamine-dextroamphetamine (ADDERALL XR) 20 MG 24 hr capsule and amphetamine-dextroamphetamine (ADDERALL) 5 MG tablet.  Patient was advised that thew script will be ready within 24 hrs (by Monday) unless otherwise advised by the nurse. C/b [phone]

## 2014-10-28 ENCOUNTER — Telehealth: Payer: Self-pay

## 2014-10-28 NOTE — Telephone Encounter (Signed)
I spoke to patient and she is aware that her two adderall Rxs are ready to pick up at the front desk.

## 2014-11-18 ENCOUNTER — Ambulatory Visit: Payer: 59 | Admitting: Neurology

## 2014-12-02 ENCOUNTER — Ambulatory Visit
Admission: RE | Admit: 2014-12-02 | Discharge: 2014-12-02 | Disposition: A | Discharge status: Home / Self Care | Payer: 59 | Source: Ambulatory Visit

## 2014-12-02 ENCOUNTER — Telehealth: Payer: Self-pay | Admitting: Neurology

## 2014-12-02 ENCOUNTER — Other Ambulatory Visit: Payer: Self-pay

## 2014-12-02 DIAGNOSIS — Z1231 Encounter for screening mammogram for malignant neoplasm of breast: Secondary | ICD-10-CM

## 2014-12-02 NOTE — Telephone Encounter (Signed)
Patient called requesting to be seen sooner either sometime this week or next week. DR. Athar has no openings as of now. Please call and advise if she can be worked in. Patient can be reached @ [phone]

## 2014-12-02 NOTE — Telephone Encounter (Signed)
I spoke to patient. We had cancellation for tomorrow and I was able to get patient in .

## 2014-12-03 ENCOUNTER — Encounter: Payer: Self-pay | Admitting: Neurology

## 2014-12-03 ENCOUNTER — Ambulatory Visit (INDEPENDENT_AMBULATORY_CARE_PROVIDER_SITE_OTHER): Payer: 59 | Admitting: Neurology

## 2014-12-03 VITALS — BP 132/70 | HR 88 | Resp 16 | Ht 66.0 in | Wt 133.0 lb

## 2014-12-03 DIAGNOSIS — R419 Unspecified symptoms and signs involving cognitive functions and awareness: Secondary | ICD-10-CM

## 2014-12-03 DIAGNOSIS — Z9889 Other specified postprocedural states: Secondary | ICD-10-CM

## 2014-12-03 DIAGNOSIS — G4733 Obstructive sleep apnea (adult) (pediatric): Secondary | ICD-10-CM

## 2014-12-03 DIAGNOSIS — G4711 Idiopathic hypersomnia with long sleep time: Secondary | ICD-10-CM

## 2014-12-03 DIAGNOSIS — M5020 Other cervical disc displacement, unspecified cervical region: Secondary | ICD-10-CM

## 2014-12-03 DIAGNOSIS — Z9989 Dependence on other enabling machines and devices: Secondary | ICD-10-CM

## 2014-12-03 MED ORDER — NUVIGIL 250 MG PO TABS: 250.0000 mg | ORAL_TABLET | Freq: Every day | ORAL | Status: DC

## 2014-12-03 MED ORDER — AMPHETAMINE-DEXTROAMPHETAMINE 5 MG PO TABS: 5.0000 mg | ORAL_TABLET | Freq: Every day | ORAL | Status: DC

## 2014-12-03 MED ORDER — AMPHETAMINE-DEXTROAMPHET ER 20 MG PO CP24: 40.0000 mg | ORAL_CAPSULE | ORAL | Status: DC
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
## 2014-12-03 NOTE — Patient Instructions (Addendum)
Please continue using your CPAP regularly. While your insurance requires that you use CPAP at least 4 hours each night on 70% of the nights, I recommend, that you not skip any nights and use it throughout the night if you can. Getting used to CPAP and staying with the treatment long term does take time and patience and discipline. Untreated obstructive sleep apnea when it is moderate to severe can have an adverse impact on cardiovascular health and raise her risk for heart disease, arrhythmias, hypertension, congestive heart failure, stroke and diabetes. Untreated obstructive sleep apnea causes sleep disruption, nonrestorative sleep, and sleep deprivation. This can have an impact on your day to day functioning and cause daytime sleepiness and impairment of cognitive function, memory loss, mood disturbance, and problems focussing. Using CPAP regularly can improve these symptoms.  We will continue with your Adderall XR and Adderall immediate release and Nuvigil 250 mg daily.   We will monitor your memory function. We may repeat cognitive testing with Dr. Leonides CaveZelson 12 to 18 months from the first date (08/08/14).   We may repeat your brain MRI (July 2015) in the future, but have no pressing reason to do so any time soon.

## 2014-12-26 ENCOUNTER — Ambulatory Visit: Payer: 59 | Admitting: Neurology

## 2015-01-20 ENCOUNTER — Other Ambulatory Visit: Payer: Self-pay | Admitting: Neurology

## 2015-01-20 ENCOUNTER — Telehealth: Payer: Self-pay

## 2015-01-20 DIAGNOSIS — G4711 Idiopathic hypersomnia with long sleep time: Secondary | ICD-10-CM

## 2015-01-20 DIAGNOSIS — G4733 Obstructive sleep apnea (adult) (pediatric): Secondary | ICD-10-CM

## 2015-01-20 DIAGNOSIS — Z9989 Dependence on other enabling machines and devices: Secondary | ICD-10-CM

## 2015-01-20 MED ORDER — AMPHETAMINE-DEXTROAMPHET ER 20 MG PO CP24: 40.0000 mg | ORAL_CAPSULE | ORAL | Status: DC

## 2015-01-20 MED ORDER — AMPHETAMINE-DEXTROAMPHETAMINE 5 MG PO TABS: 5.0000 mg | ORAL_TABLET | Freq: Every day | ORAL | Status: DC

## 2015-01-20 NOTE — Telephone Encounter (Signed)
Requests entered, forwarded to provider for approval.   

## 2015-01-20 NOTE — Telephone Encounter (Signed)
Pt needs refill on amphetamine-dextroamphetamine (ADDERALL XR) 20 MG 24 hr capsule and  amphetamine-dextroamphetamine (ADDERALL) 5 MG tablet. Would like to know if she can a 3 month supply.  May call pt back [phone]

## 2015-01-20 NOTE — Telephone Encounter (Signed)
I called patient to advise her that her 2 Rx that was requested if at the front desk

## 2015-03-10 ENCOUNTER — Telehealth: Payer: Self-pay | Admitting: *Deleted

## 2015-03-10 ENCOUNTER — Other Ambulatory Visit: Payer: Self-pay | Admitting: Neurology

## 2015-03-10 DIAGNOSIS — Z9989 Dependence on other enabling machines and devices: Principal | ICD-10-CM

## 2015-03-10 DIAGNOSIS — G4711 Idiopathic hypersomnia with long sleep time: Secondary | ICD-10-CM

## 2015-03-10 DIAGNOSIS — G4733 Obstructive sleep apnea (adult) (pediatric): Secondary | ICD-10-CM

## 2015-03-10 DIAGNOSIS — R419 Unspecified symptoms and signs involving cognitive functions and awareness: Secondary | ICD-10-CM

## 2015-03-10 MED ORDER — AMPHETAMINE-DEXTROAMPHET ER 20 MG PO CP24: 40.0000 mg | ORAL_CAPSULE | ORAL | Status: DC

## 2015-03-10 MED ORDER — AMPHETAMINE-DEXTROAMPHETAMINE 5 MG PO TABS: 5.0000 mg | ORAL_TABLET | Freq: Every day | ORAL | Status: DC

## 2015-03-10 NOTE — Telephone Encounter (Signed)
[facility] has a big group of neurologist, but I am not familiar with who's who in sleep neuro there. We can place a referral to them if she wishes to neuro sleep d/o for transfer of care? Pls ask pt and let me know.

## 2015-03-10 NOTE — Telephone Encounter (Signed)
Left message to call back and let us know if she wants [place] to send in referral for [facility].

## 2015-03-10 NOTE — Telephone Encounter (Signed)
Request entered, forwarded to provider for approval.  

## 2015-03-10 NOTE — Telephone Encounter (Signed)
Patient is calling to order 2 written Rx amphetamine-dextroamphetamine: 20 mg and 5 mg prescriptions.  Thanks!

## 2015-03-11 NOTE — Telephone Encounter (Signed)
Also her Adderall prescriptions are at the front desk for pick up.

## 2015-03-12 ENCOUNTER — Telehealth: Payer: Self-pay

## 2015-03-12 NOTE — Telephone Encounter (Signed)
I spoke to [facility] and she still wants to come here for her appt in December but eventually transfer care to [facility]. She asked for [place] to go ahead and send referral. She states that she is only able to fill 1/2 of her Adderall prescription due to the price and insurance coverage.

## 2015-03-12 NOTE — Telephone Encounter (Signed)
Tekia just wanted me to let you know that she is only able to refill 1/2 of her Adderall Rx because of the price and insurance only covering part of it.

## 2015-03-13 NOTE — Telephone Encounter (Signed)
Referral has been placed. 

## 2015-03-21 ENCOUNTER — Encounter: Payer: Self-pay | Admitting: Obstetrics and Gynecology

## 2015-03-21 ENCOUNTER — Ambulatory Visit (INDEPENDENT_AMBULATORY_CARE_PROVIDER_SITE_OTHER): Payer: 59 | Admitting: Obstetrics and Gynecology

## 2015-03-21 VITALS — BP 112/72 | HR 80 | Resp 16 | Ht 64.5 in | Wt 141.2 lb

## 2015-03-21 DIAGNOSIS — R87811 Vaginal high risk human papillomavirus (HPV) DNA test positive: Secondary | ICD-10-CM

## 2015-03-21 DIAGNOSIS — Z0000 Encounter for general adult medical examination without abnormal findings: Secondary | ICD-10-CM

## 2015-03-21 DIAGNOSIS — Z01419 Encounter for gynecological examination (general) (routine) without abnormal findings: Secondary | ICD-10-CM

## 2015-03-21 HISTORY — PX: OTHER SURGICAL HISTORY: SHX169

## 2015-03-21 LAB — POCT URINALYSIS DIPSTICK
Bilirubin, UA: NEGATIVE
Blood, UA: NEGATIVE
Ketones, UA: NEGATIVE
Leukocytes, UA: NEGATIVE
Nitrite, UA: NEGATIVE
Protein, UA: NEGATIVE
Urobilinogen, UA: NEGATIVE
pH, UA: 5

## 2015-03-21 MED ORDER — ESTROGENS CONJUGATED 1.25 MG PO TABS
1.2500 mg | ORAL_TABLET | Freq: Every day | ORAL | Status: DC
Start: 2015-03-21 — End: 2016-04-12

## 2015-03-21 MED ORDER — VALACYCLOVIR HCL 500 MG PO TABS: 500.0000 mg | ORAL_TABLET | Freq: Two times a day (BID) | ORAL | Status: DC

## 2015-03-21 NOTE — Progress Notes (Signed)
Patient ID: [name] [name], female   DOB: Sep 08, 1965, 49 y.o.   MRN: [id number] 49 y.o. G0P0000 Divorced [place] American female here for annual exam.   On Premarin 1.25 gm daily.  No hot flashes.  Working in [place].  Offered a full time position. 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  Patient doing IT work.   HgbA1C - about 9. On an insulin pump. Was having lows in her sugar level.  Rare HSV infection.   PCP:  Adrian Prince, MD  Patient's last menstrual period was 06/28/2000.          Sexually active: Yes.   female partner The current method of family planning is status post hysterectomy.    Exercising: No.  patient does walk and does pilates. 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  02/2013 Ascus with Neg HR HPV. MMG:  12-02-14 3D Density Cat.C/Neg/BiRads 1:[facility]. Colonoscopy:  03/2013 polyps with Dr. Loreta Ave.  Next due 03/2018. BMD:   n/a  Result  n/a TDaP:  PCP Screening Labs:  Hb today: PCP, Urine today: Mod.Glucose   reports that she has never smoked. She has never used smokeless tobacco. She reports that she does not drink alcohol or use illicit drugs.  Past Medical History  Diagnosis Date  . Diabetes mellitus   . Stress   . Breast cyst     left  . 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 Thyroid disease     hypothyroidism  . 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 VAIN I (vaginal intraepithelial neoplasia grade I) 2013  . Sleep apnea 2013  . Meningitis 06/2001    --hospital  . Ketoacidosis 5638,7564    Past Surgical History  Procedure Laterality Date  . Abdominal hysterectomy      2004  . Tonsillectomy and adenoidectomy    . Hand surgery  2004    tore tendon  . Soft tissue cyst excision  12/2010    left breast epidermoid  inclusion cyst  . Cervical biopsy  w/ loop electrode excision  01-24-95    CIN I  . Colposcopy  11-21-02    CIN III  . Colposcopy  05-05-12    no lesions--needs repeat pap 08/2012 per Dr. Tresa Res  . Spine surgery  05/21/14    Current Outpatient Prescriptions  Medication Sig Dispense Refill  . ALPRAZolam (XANAX) 0.5 MG tablet Take 0.25 mg by mouth as needed.     Marland Kitchen amphetamine-dextroamphetamine (ADDERALL XR) 20 MG 24 hr capsule Take 2 capsules (40 mg total) by mouth every morning. 60 capsule 0  . amphetamine-dextroamphetamine (ADDERALL) 5 MG tablet Take 1 tablet (5 mg total) by mouth daily. Midday, no later than 3PM 30 tablet 0  . buPROPion (WELLBUTRIN SR) 150 MG 12 hr tablet Take 3 tablets by mouth daily.    Marland Kitchen escitalopram (LEXAPRO) 20 MG tablet     . estrogens, conjugated, (PREMARIN) 1.25 MG tablet Take 1 tablet (1.25 mg total) by mouth daily. 90 tablet 2  . insulin lispro (HUMALOG) 100 UNIT/ML injection Inject 25 Units into the skin daily.     Marland Kitchen levothyroxine (SYNTHROID, LEVOTHROID) 112 MCG tablet Take 112 mcg by mouth daily.      Marland Kitchen NUVIGIL 250 MG tablet Take 1 tablet (  250 mg total) by mouth daily. 30 tablet 5  . ONE TOUCH ULTRA TEST test strip     . valACYclovir (VALTREX) 500 MG tablet Take 1 tablet (500 mg total) by mouth 2 (two) times daily. Take for 3 days as needed. (Patient not taking: Reported on 12/03/2014) 30 tablet 3  . Vitamin D, Ergocalciferol, (DRISDOL) 50000 UNITS CAPS capsule Take 50,000 Units by mouth every 7 (seven) days.     No current facility-administered medications for this visit.    Family History  Problem Relation Age of Onset  . Other Mother     cysts  . Diabetes Father   . Kidney disease Father   . Stroke Paternal Aunt     ROS:  Pertinent items are noted in HPI.  Otherwise, a comprehensive ROS was negative.  Exam:   BP 112/72 mmHg  Pulse 80  Resp 16  Ht 5' 4.5" (1.638 m)  Wt 141 lb 3.2 oz (64.048 kg)  BMI 23.87 kg/m2  LMP 06/28/2000    General  appearance: alert, cooperative and appears stated age Head: Normocephalic, without obvious abnormality, atraumatic Neck: no adenopathy, supple, symmetrical, trachea midline and thyroid normal to inspection and palpation.. Left cervical node 1 cm nontender.  Lungs: clear to auscultation bilaterally Breasts: normal appearance, no masses or tenderness, Inspection negative, No nipple retraction or dimpling, No nipple discharge or bleeding, No axillary or supraclavicular adenopathy Heart: regular rate and rhythm Abdomen: Insulin pump and glucose monitor attached to skin of abdomen, abdomen is soft, non-tender; no masses,no organomegaly Extremities: extremities normal, atraumatic, no cyanosis or edema Skin: Skin color, texture, turgor normal. No rashes or lesions Lymph nodes: Cervical, supraclavicular, and axillary nodes normal. No abnormal inguinal nodes palpated Neurologic: Grossly normal  Pelvic: External genitalia:  no lesions              Urethra:  normal appearing urethra with no masses, tenderness or lesions              Bartholins and Skenes: normal                 Vagina: normal appearing vagina with normal color and discharge, no lesions              Cervix: no lesions              Pap taken: Yes.   Bimanual Exam:  Uterus:  normal size, contour, position, consistency, mobility, non-tender              Adnexa: normal adnexa and no mass, fullness, tenderness              Rectovaginal: Yes.  .  Confirms.              Anus:  normal sphincter tone, no lesions  Chaperone was present for exam.  Assessment:   Well woman visit with normal exam. Status post TVH.  History of CIN III and VAIN I.  ERT patient.  Will continue. HSV.  DM.  Plan: Yearly mammogram recommended after age 76.  Recommended self breast exam.  Pap and HR HPV as above. Discussed Calcium, Vitamin D, regular exercise program including cardiovascular and weight bearing exercise. Labs performed.  No..   See  orders. Refills given on medications.  Yes.  .  See orders.  Premarin 1.25 mg daily.  Discussed benefits and risks which include DVT, PE, MI, stroke, and breast cancer.  Valtrex also refilled.  Discussed prn use and also daily use if  outbreaks occur often.  Follow up annually and prn.      After visit summary provided.

## 2015-03-21 NOTE — Patient Instructions (Signed)

## 2015-03-27 LAB — IPS PAP TEST WITH HPV

## 2015-03-31 ENCOUNTER — Telehealth: Payer: Self-pay | Admitting: Emergency Medicine

## 2015-03-31 ENCOUNTER — Telehealth: Payer: Self-pay

## 2015-03-31 NOTE — Addendum Note (Signed)
Addended by: Joeseph Amor on: 03/31/2015 09:29 AM   Modules accepted: Orders

## 2015-03-31 NOTE — Addendum Note (Signed)
Addended by: Joeseph Amor on: 03/31/2015 08:41 AM   Modules accepted: Orders

## 2015-03-31 NOTE — Telephone Encounter (Signed)
[facility]) has approved the request for coverage on Adderall effective until 03/26/2016 Ref # [id number]

## 2015-04-01 NOTE — Telephone Encounter (Signed)
Encounter opened erroneously.   Closed encounter.   

## 2015-04-02 ENCOUNTER — Encounter: Payer: Self-pay | Admitting: Obstetrics and Gynecology

## 2015-04-02 LAB — IPS HPV GENOTYPING 16/18

## 2015-04-04 ENCOUNTER — Other Ambulatory Visit: Payer: Self-pay | Admitting: Obstetrics and Gynecology

## 2015-04-07 ENCOUNTER — Telehealth: Payer: Self-pay | Admitting: Emergency Medicine

## 2015-04-07 NOTE — Telephone Encounter (Signed)
Medication refill request: Premarin  Last AEX:  03/21/15 Dr. Edward Jolly Next AEX: 04/09/16 Dr. Edward Jolly Last MMG (if hormonal medication request): 12/03/14 BIRADS1:Neg Refill authorized: 03/21/15 #90tabs/ 3R to [facility] [place]

## 2015-04-07 NOTE — Telephone Encounter (Signed)
-----   Message from Brook E Amundson C Silva, MD sent at 04/02/2015  5:19 PM EDT ----- Please inform patient of pap which is normal and HR HPV positive status.  I did subtyping with 16 and 18, which were negative.  By protocol, patient will need cotesting in one year.   She does have bacterial vaginosis.  If she is symptomatic with vaginal discharge, odor, or burning, I would like to offer tx with Flagyl 500 mg po bid for 7 days or Metrogel 0.75% at hs for 5 nights.    She does have a history of cervical dysplasia and hysterectomy in 2004 for CIN III.  Cc- Amanda Dixon 

## 2015-04-07 NOTE — Telephone Encounter (Signed)
Message left to return call to [name] [name] at [phone].    

## 2015-04-09 ENCOUNTER — Other Ambulatory Visit: Payer: Self-pay

## 2015-04-09 DIAGNOSIS — G4733 Obstructive sleep apnea (adult) (pediatric): Secondary | ICD-10-CM

## 2015-04-09 DIAGNOSIS — Z9989 Dependence on other enabling machines and devices: Secondary | ICD-10-CM

## 2015-04-09 DIAGNOSIS — G4711 Idiopathic hypersomnia with long sleep time: Secondary | ICD-10-CM

## 2015-04-09 MED ORDER — AMPHETAMINE-DEXTROAMPHETAMINE 5 MG PO TABS: 5.0000 mg | ORAL_TABLET | Freq: Every day | ORAL | Status: DC

## 2015-04-09 MED ORDER — AMPHETAMINE-DEXTROAMPHET ER 20 MG PO CP24: 40.0000 mg | ORAL_CAPSULE | ORAL | Status: DC

## 2015-04-09 NOTE — Telephone Encounter (Signed)
Patient called to request refill of Adderall. 

## 2015-04-09 NOTE — Telephone Encounter (Signed)
A separate refill encounter was opened and sent to provider for approval.

## 2015-04-10 ENCOUNTER — Telehealth: Payer: Self-pay | Admitting: Neurology

## 2015-04-10 ENCOUNTER — Telehealth: Payer: Self-pay

## 2015-04-10 NOTE — Telephone Encounter (Signed)
Patient states that her insurance company will only pay for 30 tablets of Adderall. Insurance needs authorization from Dr. Frances FurbishAthar before approving. [employer] [phone].  Patient's best call back is [phone].

## 2015-04-10 NOTE — Telephone Encounter (Signed)
I left message for patient to advise her 2 Rx (Adderall XR and Adderall IR) are ready to p/u at front desk.

## 2015-04-10 NOTE — Telephone Encounter (Signed)
Was this completed 03/31/15?

## 2015-04-10 NOTE — Telephone Encounter (Signed)
This was already approved by ins on 10/03.  I called the pharmacy.  Spoke with Annice PihJackie.  She said the patient has not brought in the Rx written on 10/12, so they have not processed a claim since PA was approved.  Indicated the patient just needs to bring them the Rx.  I called the patient back to relay this info.  Got no answer.  Left message.

## 2015-04-16 MED ORDER — METRONIDAZOLE 500 MG PO TABS: 500.0000 mg | ORAL_TABLET | Freq: Two times a day (BID) | ORAL | Status: DC

## 2015-04-17 ENCOUNTER — Other Ambulatory Visit: Payer: Self-pay

## 2015-04-17 ENCOUNTER — Telehealth: Payer: Self-pay | Admitting: Neurology

## 2015-04-17 DIAGNOSIS — G4733 Obstructive sleep apnea (adult) (pediatric): Secondary | ICD-10-CM

## 2015-04-17 DIAGNOSIS — Z9989 Dependence on other enabling machines and devices: Secondary | ICD-10-CM

## 2015-04-17 DIAGNOSIS — G4711 Idiopathic hypersomnia with long sleep time: Secondary | ICD-10-CM

## 2015-04-17 MED ORDER — AMPHETAMINE-DEXTROAMPHET ER 20 MG PO CP24: 40.0000 mg | ORAL_CAPSULE | ORAL | Status: DC

## 2015-04-17 NOTE — Telephone Encounter (Signed)
Rx reprinted and faxed to pharmacy. I tried to call pharmacy to confirm but they are closed for lunch.

## 2015-04-17 NOTE — Telephone Encounter (Signed)
Hard copy of Adderall rx. Lost by pharmacy.  Reprinted per Dr. Teofilo PodAthar's ok/fim

## 2015-04-17 NOTE — Telephone Encounter (Signed)
[name] CornfieldStephanie at Pharmacy requested that I mail Rx due to it being a controlled Rx. I confirmed address and will mail out today.

## 2015-04-17 NOTE — Telephone Encounter (Signed)
Stephanie from [facility] called back. Please call [phone].

## 2015-04-17 NOTE — Telephone Encounter (Signed)
[facility] and says they lost the hard copy of  amphetamine-dextroamphetamine (ADDERALL XR) 20 MG 24 hr capsule and wants to know if they can get another one. Please call and advise [phone] Cornfield- Stephanie.

## 2015-05-08 ENCOUNTER — Telehealth: Payer: Self-pay

## 2015-05-08 NOTE — Telephone Encounter (Signed)
-----   Message from Patton SallesBrook E Amundson C Silva, MD sent at 04/02/2015  5:19 PM EDT ----- Please inform patient of pap which is normal and HR HPV positive status.  I did subtyping with 16 and 18, which were negative.  By protocol, patient will need cotesting in one year.   She does have bacterial vaginosis.  If she is symptomatic with vaginal discharge, odor, or burning, I would like to offer tx with Flagyl 500 mg po bid for 7 days or Metrogel 0.75% at hs for 5 nights.    She does have a history of cervical dysplasia and hysterectomy in 2004 for CIN III.  Cc- Claudette LawsAmanda Antonyo Hinderer

## 2015-05-08 NOTE — Telephone Encounter (Signed)
Called patient at [phone] to discuss results of last pap smear from 03-21-15. We have called and left her messages to call to discuss but she has not returned our call.  Left message on voicemail to call me back.  Advised patient to speak with triage nurse if I am unavailable.

## 2015-05-12 ENCOUNTER — Telehealth: Payer: Self-pay

## 2015-05-12 ENCOUNTER — Other Ambulatory Visit: Payer: Self-pay | Admitting: Neurology

## 2015-05-12 NOTE — Telephone Encounter (Signed)
I spoke to patient and she is aware that we will fax Nuvigil Rx to [facility] pharmacy.

## 2015-05-12 NOTE — Telephone Encounter (Signed)
Patient has an appt in Dec

## 2015-05-15 NOTE — Telephone Encounter (Signed)
-----   Message from Brook E Amundson C Silva, MD sent at 04/02/2015  5:19 PM EDT ----- Please inform patient of pap which is normal and HR HPV positive status.  I did subtyping with 16 and 18, which were negative.  By protocol, patient will need cotesting in one year.   She does have bacterial vaginosis.  If she is symptomatic with vaginal discharge, odor, or burning, I would like to offer tx with Flagyl 500 mg po bid for 7 days or Metrogel 0.75% at hs for 5 nights.    She does have a history of cervical dysplasia and hysterectomy in 2004 for CIN III.  Cc- Amanda Dixon 

## 2015-05-15 NOTE — Telephone Encounter (Signed)
Called patient at [phone] to discuss results of pap smear, left message stating this was important and would she please give me a call back.  I also sent patient a letter.

## 2015-05-27 NOTE — Telephone Encounter (Signed)
Notes Recorded by [name] [name], CMA on 05/21/2015 at 11:18 AM Called patient to see if she received our letter about pap results. She states someone spoke with her earlier and gave pap results and called in Flagyl. Patient has been in [place] and that is why she never received any of our messages or letter.

## 2015-05-29 ENCOUNTER — Other Ambulatory Visit: Payer: Self-pay | Admitting: Neurology

## 2015-05-29 ENCOUNTER — Telehealth: Payer: Self-pay | Admitting: Neurology

## 2015-05-29 DIAGNOSIS — Z9989 Dependence on other enabling machines and devices: Principal | ICD-10-CM

## 2015-05-29 DIAGNOSIS — G4733 Obstructive sleep apnea (adult) (pediatric): Secondary | ICD-10-CM

## 2015-05-29 DIAGNOSIS — G4711 Idiopathic hypersomnia with long sleep time: Secondary | ICD-10-CM

## 2015-05-29 MED ORDER — AMPHETAMINE-DEXTROAMPHETAMINE 5 MG PO TABS: 5.0000 mg | ORAL_TABLET | Freq: Every day | ORAL | Status: DC

## 2015-05-29 MED ORDER — AMPHETAMINE-DEXTROAMPHET ER 20 MG PO CP24: 40.0000 mg | ORAL_CAPSULE | ORAL | Status: DC

## 2015-05-29 NOTE — Telephone Encounter (Signed)
Left message for patient to advise her that we will [facility] and put order at front desk for her to pick up.

## 2015-05-29 NOTE — Telephone Encounter (Signed)
I spoke to patient and advised her that her adderall xr and adderall ir are ready at the front desk for pick up.

## 2015-05-29 NOTE — Telephone Encounter (Signed)
Requests entered, forwarded to provider for approval.   

## 2015-05-29 NOTE — Telephone Encounter (Signed)
Patient called to request refills of amphetamine-dextroamphetamine (ADDERALL XR) 20 MG 24 hr capsule and amphetamine-dextroamphetamine (ADDERALL) 5 MG tablet. Patient requests a phone call back when Rx's are ready. [place] in [place] and [place]. Please call [phone].

## 2015-05-29 NOTE — Telephone Encounter (Addendum)
Pt called sts she needs RX for CPAP replacement parts: Resmed- mask- ref #[id number]/ Lot #[id number], nasal pillows small (she had extra small pillows caused irritation on nose) ref# [id number], tubing -does not have numbers, also need filters by [employer] [employer]/ order [id number] (1/pk) Remed #S9style filter. She is requesting a 3 mth supply. Pt will pick up RX refill tomorrow at 9, if CPAP replacement RX not ready please mail. Please call and advise

## 2015-06-11 ENCOUNTER — Ambulatory Visit: Payer: Self-pay | Admitting: Neurology

## 2015-06-17 ENCOUNTER — Ambulatory Visit (INDEPENDENT_AMBULATORY_CARE_PROVIDER_SITE_OTHER): Payer: Managed Care, Other (non HMO) | Admitting: Neurology

## 2015-06-17 ENCOUNTER — Encounter: Payer: Self-pay | Admitting: Neurology

## 2015-06-17 VITALS — BP 120/68 | HR 76 | Resp 16 | Ht 64.0 in | Wt 141.0 lb

## 2015-06-17 DIAGNOSIS — G4711 Idiopathic hypersomnia with long sleep time: Secondary | ICD-10-CM | POA: Diagnosis not present

## 2015-06-17 DIAGNOSIS — G4733 Obstructive sleep apnea (adult) (pediatric): Secondary | ICD-10-CM

## 2015-06-17 DIAGNOSIS — Z9989 Dependence on other enabling machines and devices: Secondary | ICD-10-CM

## 2015-06-17 MED ORDER — AMPHETAMINE-DEXTROAMPHET ER 25 MG PO CP24: 50.0000 mg | ORAL_CAPSULE | ORAL | Status: DC

## 2015-06-17 MED ORDER — AMPHETAMINE-DEXTROAMPHETAMINE 10 MG PO TABS
ORAL_TABLET | ORAL | Status: DC
Start: 2015-06-17 — End: 2015-07-21

## 2015-06-17 MED ORDER — NUVIGIL 250 MG PO TABS: 250.0000 mg | ORAL_TABLET | Freq: Every day | ORAL | Status: DC
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
## 2015-06-17 NOTE — Patient Instructions (Addendum)
We will continue with your medications, but will increase your Adderall XR to 25 mg pills, take 2 in the morning (=50 mg) and 10 mg of regular Adderall in the early afternoon, 1-2 PM.  We will continue for now with your Nuvigil 250 mg once daily in the morning.    Please continue using your CPAP regularly. While your insurance requires that you use CPAP at least 4 hours each night on 70% of the nights, I recommend, that you not skip any nights and use it throughout the night if you can. Getting used to CPAP and staying with the treatment long term does take time and patience and discipline. Untreated obstructive sleep apnea when it is moderate to severe can have an adverse impact on cardiovascular health and raise her risk for heart disease, arrhythmias, hypertension, congestive heart failure, stroke and diabetes. Untreated obstructive sleep apnea causes sleep disruption, nonrestorative sleep, and sleep deprivation. This can have an impact on your day to day functioning and cause daytime sleepiness and impairment of cognitive function, memory loss, mood disturbance, and problems focussing. Using CPAP regularly can improve these symptoms.

## 2015-06-26 NOTE — Telephone Encounter (Signed)
See phone note dated 04-07-15.

## 2015-07-07 ENCOUNTER — Other Ambulatory Visit: Payer: Self-pay | Admitting: Neurology

## 2015-07-21 ENCOUNTER — Telehealth: Payer: Self-pay | Admitting: Neurology

## 2015-07-21 DIAGNOSIS — Z9989 Dependence on other enabling machines and devices: Secondary | ICD-10-CM

## 2015-07-21 DIAGNOSIS — G4733 Obstructive sleep apnea (adult) (pediatric): Secondary | ICD-10-CM

## 2015-07-21 DIAGNOSIS — G4711 Idiopathic hypersomnia with long sleep time: Secondary | ICD-10-CM

## 2015-07-21 MED ORDER — AMPHETAMINE-DEXTROAMPHETAMINE 10 MG PO TABS: ORAL_TABLET | ORAL | Status: DC

## 2015-07-21 MED ORDER — AMPHETAMINE-DEXTROAMPHET ER 25 MG PO CP24: 50.0000 mg | ORAL_CAPSULE | ORAL | Status: DC

## 2015-07-21 NOTE — Telephone Encounter (Signed)
Both printed, waiting for signature and then will put at front desk for p/u.

## 2015-07-21 NOTE — Telephone Encounter (Signed)
Patient is calling to order 2 written Rx:   Rx amphetamine-dextroamphetamine 25 mg 24 hr capsule. Rx Amphetamine-dextroamphetamine 10 mg tablets. Patient states she lives in [place] and will be coming in to [place] this afternoon and would like to pick up then. She was advised it normally takes 24 hours to turn around.  Thanks!

## 2015-07-21 NOTE — Telephone Encounter (Signed)
I spoke to patient and let her know that her Rx is ready to p/u at front desk.

## 2015-08-14 ENCOUNTER — Telehealth: Payer: Self-pay | Admitting: Neurology

## 2015-08-14 DIAGNOSIS — G4733 Obstructive sleep apnea (adult) (pediatric): Secondary | ICD-10-CM

## 2015-08-14 DIAGNOSIS — G4711 Idiopathic hypersomnia with long sleep time: Secondary | ICD-10-CM

## 2015-08-14 DIAGNOSIS — Z9989 Dependence on other enabling machines and devices: Secondary | ICD-10-CM

## 2015-08-14 NOTE — Telephone Encounter (Signed)
Pt called requesting refill for amphetamine-dextroamphetamine (ADDERALL) 10 MG tablet and amphetamine-dextroamphetamine (ADDERALL XR) 25 MG 24 hr capsule . Pt will pick up on Monday

## 2015-08-18 MED ORDER — AMPHETAMINE-DEXTROAMPHETAMINE 10 MG PO TABS: ORAL_TABLET | ORAL | Status: DC

## 2015-08-18 MED ORDER — AMPHETAMINE-DEXTROAMPHET ER 25 MG PO CP24: 50.0000 mg | ORAL_CAPSULE | ORAL | Status: DC

## 2015-08-18 NOTE — Telephone Encounter (Signed)
Rx printed, waiting for Dr. Frances Furbish to sign. I will send patient message though MyChart advising her to P/U.

## 2015-10-06 ENCOUNTER — Ambulatory Visit: Payer: Managed Care, Other (non HMO) | Admitting: Neurology

## 2015-10-20 ENCOUNTER — Encounter: Payer: Self-pay | Admitting: Neurology

## 2015-10-20 ENCOUNTER — Ambulatory Visit (INDEPENDENT_AMBULATORY_CARE_PROVIDER_SITE_OTHER): Payer: Managed Care, Other (non HMO) | Admitting: Neurology

## 2015-10-20 VITALS — BP 140/72 | HR 80 | Resp 16 | Ht 64.0 in | Wt 146.0 lb

## 2015-10-20 DIAGNOSIS — G4733 Obstructive sleep apnea (adult) (pediatric): Secondary | ICD-10-CM | POA: Diagnosis not present

## 2015-10-20 DIAGNOSIS — G4711 Idiopathic hypersomnia with long sleep time: Secondary | ICD-10-CM

## 2015-10-20 DIAGNOSIS — Z9989 Dependence on other enabling machines and devices: Secondary | ICD-10-CM

## 2015-10-20 MED ORDER — AMPHETAMINE-DEXTROAMPHETAMINE 10 MG PO TABS: 20.0000 mg | ORAL_TABLET | Freq: Every day | ORAL | Status: DC

## 2015-10-20 MED ORDER — NUVIGIL 250 MG PO TABS: 250.0000 mg | ORAL_TABLET | Freq: Every day | ORAL | Status: DC

## 2015-10-20 MED ORDER — AMPHETAMINE-DEXTROAMPHET ER 25 MG PO CP24: 50.0000 mg | ORAL_CAPSULE | ORAL | Status: DC
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
## 2015-10-20 NOTE — Patient Instructions (Addendum)
Let's try this for your sleepiness, especially while driving on your long commute:    Take 10 mg of adderall IR (immediate release) once in am, before you start your drive to work, then take another 10 mg at work, along with your 25 mg Adderall XR, 2 pills.  Take your Nuvigil as usual around 11 AM.  If you need to, you could take your IR adderall together before leaving the house in the morning, so 20 mg.  I will change the Rx for the IR Adderall and keep everything else.

## 2015-12-10 ENCOUNTER — Telehealth: Payer: Self-pay | Admitting: Neurology

## 2015-12-10 DIAGNOSIS — Z9989 Dependence on other enabling machines and devices: Secondary | ICD-10-CM

## 2015-12-10 DIAGNOSIS — G4711 Idiopathic hypersomnia with long sleep time: Secondary | ICD-10-CM

## 2015-12-10 DIAGNOSIS — G4733 Obstructive sleep apnea (adult) (pediatric): Secondary | ICD-10-CM

## 2015-12-10 MED ORDER — AMPHETAMINE-DEXTROAMPHETAMINE 10 MG PO TABS: 20.0000 mg | ORAL_TABLET | Freq: Every day | ORAL | Status: DC

## 2015-12-10 MED ORDER — AMPHETAMINE-DEXTROAMPHET ER 25 MG PO CP24: 50.0000 mg | ORAL_CAPSULE | ORAL | Status: DC

## 2015-12-10 NOTE — Telephone Encounter (Signed)
Patient requesting refill of amphetamine-dextroamphetamine (ADDERALL XR) 25 MG 24 hr capsule and amphetamine-dextroamphetamine (ADDERALL) 10 MG tablet.  I advised the Rx's will be ready in 24 hours unless the nurse advises otherwise but the patient says she will be in town this afternoon and would like to pick up today. I advised her to call before coming to the office.

## 2015-12-10 NOTE — Telephone Encounter (Signed)
Rx signed and taken to the front desk for p/u

## 2015-12-10 NOTE — Telephone Encounter (Signed)
Rxs printed, waiting for signature 

## 2015-12-22 ENCOUNTER — Other Ambulatory Visit: Payer: Self-pay | Admitting: Obstetrics and Gynecology

## 2015-12-22 DIAGNOSIS — Z1231 Encounter for screening mammogram for malignant neoplasm of breast: Secondary | ICD-10-CM

## 2015-12-29 ENCOUNTER — Ambulatory Visit: Payer: 59

## 2016-01-09 ENCOUNTER — Ambulatory Visit: Payer: 59

## 2016-01-30 ENCOUNTER — Ambulatory Visit
Admission: RE | Admit: 2016-01-30 | Discharge: 2016-01-30 | Disposition: A | Discharge status: Home / Self Care | Payer: Managed Care, Other (non HMO) | Source: Ambulatory Visit | Attending: Obstetrics and Gynecology | Admitting: Obstetrics and Gynecology

## 2016-01-30 DIAGNOSIS — Z1231 Encounter for screening mammogram for malignant neoplasm of breast: Secondary | ICD-10-CM

## 2016-02-02 ENCOUNTER — Telehealth: Payer: Self-pay | Admitting: Neurology

## 2016-02-02 DIAGNOSIS — G4733 Obstructive sleep apnea (adult) (pediatric): Secondary | ICD-10-CM

## 2016-02-02 DIAGNOSIS — G4711 Idiopathic hypersomnia with long sleep time: Secondary | ICD-10-CM

## 2016-02-02 DIAGNOSIS — Z9989 Dependence on other enabling machines and devices: Secondary | ICD-10-CM

## 2016-02-02 MED ORDER — AMPHETAMINE-DEXTROAMPHETAMINE 10 MG PO TABS: 20.0000 mg | ORAL_TABLET | Freq: Every day | ORAL | 0 refills | Status: DC

## 2016-02-02 MED ORDER — AMPHETAMINE-DEXTROAMPHET ER 25 MG PO CP24: 50.0000 mg | ORAL_CAPSULE | ORAL | 0 refills | Status: DC

## 2016-02-02 NOTE — Addendum Note (Signed)
Addended by: Crisoforo OxfordURNER, Satya Buttram S on: 02/02/2016 10:25 AM   Modules accepted: Orders

## 2016-02-02 NOTE — Telephone Encounter (Signed)
Orders printed and put on Dr. Teofilo PodAthar's desk for p/u.

## 2016-02-02 NOTE — Telephone Encounter (Signed)
Patient requesting refill of amphetamine-dextroamphetamine (ADDERALL) 10 MG tablet , amphetamine-dextroamphetamine (ADDERALL XR) 25 MG 24 hr capsule Pharmacy: pick up

## 2016-02-02 NOTE — Telephone Encounter (Signed)
LM for patient stating that her Rxs are ready to pick up at front desk.

## 2016-02-23 ENCOUNTER — Ambulatory Visit (INDEPENDENT_AMBULATORY_CARE_PROVIDER_SITE_OTHER): Payer: Managed Care, Other (non HMO) | Admitting: Neurology

## 2016-02-23 ENCOUNTER — Encounter: Payer: Self-pay | Admitting: Neurology

## 2016-02-23 VITALS — BP 122/60 | HR 88 | Resp 16 | Ht 64.0 in | Wt 141.0 lb

## 2016-02-23 DIAGNOSIS — G4733 Obstructive sleep apnea (adult) (pediatric): Secondary | ICD-10-CM

## 2016-02-23 DIAGNOSIS — G4711 Idiopathic hypersomnia with long sleep time: Secondary | ICD-10-CM | POA: Diagnosis not present

## 2016-02-23 DIAGNOSIS — Z9989 Dependence on other enabling machines and devices: Principal | ICD-10-CM

## 2016-02-23 MED ORDER — AMPHETAMINE-DEXTROAMPHETAMINE 10 MG PO TABS: 20.0000 mg | ORAL_TABLET | Freq: Every day | ORAL | 0 refills | Status: DC

## 2016-02-23 MED ORDER — AMPHETAMINE-DEXTROAMPHET ER 25 MG PO CP24: 50.0000 mg | ORAL_CAPSULE | ORAL | 0 refills | Status: DC

## 2016-02-23 MED ORDER — ARMODAFINIL 150 MG PO TABS: 150.0000 mg | ORAL_TABLET | Freq: Every day | ORAL | 5 refills | Status: DC

## 2016-02-23 NOTE — Patient Instructions (Addendum)
  You can try Melatonin at night for sleep: take 3 mg to 5 mg, one to 2 hours before your bedtime. You can go up to 10 mg if needed. It is over the counter and comes in pill form, chewable form and spray, if you prefer.     As discussed, you will take your immediate release Adderall 10 mg first pill first thing in the morning, second toe when you get to work, first pill of long-acting Adderall 25 mg late morning, second pill after lunch. you can take your Nuvigil which is now going to be 150 mg strength sometime between 2 and 3 PM.  I will prescribe a second smaller CPAP machine for travel purposes. You can talk to your DME provider about purchasing a second machine.
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
## 2016-03-26 ENCOUNTER — Other Ambulatory Visit: Payer: Self-pay | Admitting: Neurology

## 2016-03-26 DIAGNOSIS — G4733 Obstructive sleep apnea (adult) (pediatric): Secondary | ICD-10-CM

## 2016-03-26 DIAGNOSIS — G4711 Idiopathic hypersomnia with long sleep time: Secondary | ICD-10-CM

## 2016-03-26 DIAGNOSIS — Z9989 Dependence on other enabling machines and devices: Secondary | ICD-10-CM

## 2016-03-26 MED ORDER — AMPHETAMINE-DEXTROAMPHETAMINE 10 MG PO TABS: 20.0000 mg | ORAL_TABLET | Freq: Every day | ORAL | 0 refills | Status: DC

## 2016-03-26 MED ORDER — AMPHETAMINE-DEXTROAMPHET ER 25 MG PO CP24: 50.0000 mg | ORAL_CAPSULE | ORAL | 0 refills | Status: DC

## 2016-03-26 NOTE — Telephone Encounter (Signed)
Spoke to pt and let her know ready for pick up. Relayed that we close at 1200.  She verbalized understanding.

## 2016-03-26 NOTE — Telephone Encounter (Signed)
Patient called to request refills of amphetamine-dextroamphetamine (ADDERALL XR) 25 MG 24 hr capsule and amphetamine-dextroamphetamine (ADDERALL) 10 MG tablet, states she has (1) left for tomorrow. Will be in town today and would like to pick up today before office closes at 12:00pm.

## 2016-04-09 ENCOUNTER — Ambulatory Visit: Payer: 59 | Admitting: Obstetrics and Gynecology

## 2016-04-12 ENCOUNTER — Ambulatory Visit (INDEPENDENT_AMBULATORY_CARE_PROVIDER_SITE_OTHER): Payer: Managed Care, Other (non HMO) | Admitting: Obstetrics and Gynecology

## 2016-04-12 ENCOUNTER — Encounter: Payer: Self-pay | Admitting: Obstetrics and Gynecology

## 2016-04-12 VITALS — BP 116/70 | HR 88 | Resp 18 | Ht 64.0 in | Wt 136.0 lb

## 2016-04-12 DIAGNOSIS — Z0000 Encounter for general adult medical examination without abnormal findings: Secondary | ICD-10-CM

## 2016-04-12 DIAGNOSIS — Z01419 Encounter for gynecological examination (general) (routine) without abnormal findings: Secondary | ICD-10-CM | POA: Diagnosis not present

## 2016-04-12 LAB — POCT URINALYSIS DIPSTICK
Bilirubin, UA: NEGATIVE
Blood, UA: NEGATIVE
Glucose, UA: 1000
Ketones, UA: NEGATIVE
Leukocytes, UA: NEGATIVE
Nitrite, UA: NEGATIVE
Protein, UA: NEGATIVE
Urobilinogen, UA: NEGATIVE
pH, UA: 6

## 2016-04-12 MED ORDER — ESTROGENS CONJUGATED 1.25 MG PO TABS: 1.2500 mg | ORAL_TABLET | Freq: Every day | ORAL | 3 refills | Status: DC

## 2016-04-12 NOTE — Progress Notes (Signed)
50 y.o. G0P0000 Divorced [place] American female here for annual exam.    On Premarin 1.25 mg daily at hs.  Not having any hot flashes.  On an insulin pump.  Hgb A1C was about 7.8 with last check.  Has had problems with running too low.   Having varicose veins.  No superficial thrombosis.   Working in [place].  Staying in [place] a few nights a week at her boyfriend's family.   PCP:   Dr. Adrian Prince- [facility]  Patient's last menstrual period was 06/28/2000.           Sexually active: Yes.    The current method of family planning is status post hysterectomy.    Exercising: Yes.    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 02/2013 Ascus with Neg HR HPV.  MMG:  02-02-16 Density C/Neg/BiRads1:[facility] Colonoscopy:  05-02-13 normal with Dr.Mann;next due 04/2020. BMD:   n/a Result  n/a TDaP:  PCP Gardasil:   N/A Hep C:  NA.  Flu vaccine:  Done in August with PCP.  Screening Labs:  Hb today: PCP, Urine today: 1000  Glucose -- patient states that she has eaten about 1 hour ago--diabetic   reports that she has never smoked. She has never used smokeless tobacco. She reports that she does not drink alcohol or use drugs.  Past Medical History:  Diagnosis Date  . Breast cyst    left  . 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 Diabetes mellitus   . Ketoacidosis [id number]  . Meningitis 06/2001   --hospital  . Sleep apnea 2013  . 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 Stress   . Thyroid disease    hypothyroidism  . VAIN I (vaginal intraepithelial neoplasia grade I) 2013    Past Surgical History:  Procedure Laterality Date  . ABDOMINAL HYSTERECTOMY     2004  . CERVICAL BIOPSY  W/ LOOP ELECTRODE EXCISION  01-24-95   CIN I  . COLPOSCOPY  11-21-02   CIN III  . COLPOSCOPY  05-05-12   no lesions--needs repeat pap 08/2012 per Dr. Tresa Res  . HAND SURGERY  2004   tore tendon  . positive HPV  03/21/15   normal pap and negative types 16/18  . SOFT TISSUE CYST EXCISION  12/2010   left breast epidermoid inclusion cyst  . SPINE SURGERY  05/21/14  . TONSILLECTOMY AND ADENOIDECTOMY      Current Outpatient Prescriptions  Medication Sig Dispense Refill  . ALPRAZolam (XANAX) 0.5 MG tablet Take 0.25 mg by mouth as needed.     Marland Kitchen amphetamine-dextroamphetamine (ADDERALL XR) 25 MG 24 hr capsule Take 2 capsules by mouth every morning. 60 capsule 0  . amphetamine-dextroamphetamine (ADDERALL) 10 MG tablet Take 2 tablets (20 mg total) by mouth daily. 60 tablet 0  . Armodafinil 150 MG tablet Take 1 tablet (150 mg total) by mouth daily. 30 tablet 5  . BAYER CONTOUR NEXT TEST test strip     . buPROPion (WELLBUTRIN SR) 150 MG 12 hr tablet Take 2 tablets by mouth daily.     Marland Kitchen estrogens, conjugated, (PREMARIN) 1.25 MG tablet Take 1 tablet (1.25 mg total) by mouth daily. 90 tablet 3  . insulin lispro (HUMALOG) 100 UNIT/ML injection Inject 25  Units into the skin daily.     Marland Kitchen. levothyroxine (SYNTHROID, LEVOTHROID) 112 MCG tablet Take 112 mcg by mouth daily.      . Vitamin D, Ergocalciferol, (DRISDOL) 50000 units CAPS capsule     . XIIDRA 5 % SOLN Apply to eye 2 (two) times daily.     No current facility-administered medications for this visit.     Family History  Problem Relation Age of Onset  . Other Mother     cysts  . Diabetes Father   . Kidney disease Father   . Stroke Paternal Aunt     ROS:  Pertinent items are noted in HPI.  Otherwise, a comprehensive ROS was negative.  Exam:   BP 116/70 (BP Location: Right Arm, Patient Position: Sitting, Cuff Size: Normal)   Pulse 88   Resp 18   Ht 5\' 4"  (1.626 m)   Wt 136 lb (61.7 kg)   LMP 06/28/2000   BMI 23.34 kg/m     General appearance: alert, cooperative and appears stated age Head: Normocephalic,  without obvious abnormality, atraumatic Neck: no adenopathy, supple, symmetrical, trachea midline and thyroid normal to inspection and palpation Lungs: clear to auscultation bilaterally Breasts: normal appearance, no masses or tenderness, No nipple retraction or dimpling, No nipple discharge or bleeding, No axillary or supraclavicular adenopathy Heart: regular rate and rhythm Abdomen: soft, non-tender; no masses, no organomegaly.  Insulin pump in place.  Extremities: extremities normal, atraumatic, no cyanosis or edema Skin: Skin color, texture, turgor normal. No rashes or lesions Lymph nodes: Cervical, supraclavicular, and axillary nodes normal. No abnormal inguinal nodes palpated Neurologic: Grossly normal  Pelvic: External genitalia:  no lesions              Urethra:  normal appearing urethra with no masses, tenderness or lesions              Bartholins and Skenes: normal                 Vagina: normal appearing vagina with normal color and discharge, no lesions              Cervix: absent.              Pap taken: Yes.   Bimanual Exam:  Uterus:  Absent.              Adnexa: no mass, fullness, tenderness              Rectal exam: Yes.  .  Confirms.              Anus:  normal sphincter tone, no lesions  Chaperone was present for exam.  Assessment:   Well woman visit with normal exam. Status post TVH.  History of CIN III and VAIN I.  Pap last year normal and positive HR HPV, negative 16/18. ERT patient.  Will continue. HSV. Does not need refills.  DM. Varicose veins.   Plan: Yearly mammogram recommended. Recommended breast self awareness. Pap and HR HPV as above. Discussed Calcium, Vitamin D, regular exercise program including cardiovascular and weight bearing exercise. Premarin 1.25 mg daily. #90, RF 3.  Discussed benefits and potential risks of thromboembolic events.  We discussed varicose veins and signs of superficial and deep venous thrombosis.  We discussed  compression hose and even referral to a vein specialist.   She is declining the latter. Follow up annually and prn.       After visit summary provided.

## 2016-04-12 NOTE — Patient Instructions (Signed)

## 2016-04-15 LAB — IPS PAP TEST WITH HPV

## 2016-05-17 ENCOUNTER — Telehealth: Payer: Self-pay | Admitting: Neurology

## 2016-05-17 DIAGNOSIS — G4733 Obstructive sleep apnea (adult) (pediatric): Secondary | ICD-10-CM

## 2016-05-17 DIAGNOSIS — G4711 Idiopathic hypersomnia with long sleep time: Secondary | ICD-10-CM

## 2016-05-17 DIAGNOSIS — Z9989 Dependence on other enabling machines and devices: Secondary | ICD-10-CM

## 2016-05-17 NOTE — Telephone Encounter (Signed)
Patient called to request refills of amphetamine-dextroamphetamine (ADDERALL XR) 25 MG 24 hr capsule and amphetamine-dextroamphetamine (ADDERALL) 10 MG tablet.

## 2016-05-17 NOTE — Telephone Encounter (Signed)
Ok to refill x 1  

## 2016-05-17 NOTE — Telephone Encounter (Signed)
Both Rx due and she is up to date on appointments. Ok for refills?

## 2016-05-18 MED ORDER — AMPHETAMINE-DEXTROAMPHET ER 25 MG PO CP24: 50.0000 mg | ORAL_CAPSULE | ORAL | 0 refills | Status: DC

## 2016-05-18 MED ORDER — AMPHETAMINE-DEXTROAMPHETAMINE 10 MG PO TABS: 20.0000 mg | ORAL_TABLET | Freq: Every day | ORAL | 0 refills | Status: DC

## 2016-05-18 NOTE — Telephone Encounter (Signed)
Dr Frances FurbishAthar is here today, printed and put on her desk for signature.

## 2016-05-18 NOTE — Telephone Encounter (Signed)
I called patient and let her know that the Rxs are ready at front desk for p/u.

## 2016-06-03 ENCOUNTER — Other Ambulatory Visit: Payer: Self-pay | Admitting: Obstetrics and Gynecology

## 2016-06-08 ENCOUNTER — Ambulatory Visit: Payer: Managed Care, Other (non HMO) | Admitting: Neurology

## 2016-06-16 ENCOUNTER — Telehealth: Payer: Self-pay

## 2016-06-16 DIAGNOSIS — G4711 Idiopathic hypersomnia with long sleep time: Secondary | ICD-10-CM

## 2016-06-16 DIAGNOSIS — Z9989 Dependence on other enabling machines and devices: Secondary | ICD-10-CM

## 2016-06-16 DIAGNOSIS — G4733 Obstructive sleep apnea (adult) (pediatric): Secondary | ICD-10-CM

## 2016-06-16 MED ORDER — AMPHETAMINE-DEXTROAMPHETAMINE 10 MG PO TABS: 20.0000 mg | ORAL_TABLET | Freq: Every day | ORAL | 0 refills | Status: DC

## 2016-06-16 MED ORDER — AMPHETAMINE-DEXTROAMPHET ER 25 MG PO CP24: 50.0000 mg | ORAL_CAPSULE | ORAL | 0 refills | Status: DC

## 2016-06-16 NOTE — Telephone Encounter (Signed)
Pt is up to date on her appointments and she is due for refills on her adderall. Will print and have Dr. Frances FurbishAthar review.

## 2016-06-16 NOTE — Telephone Encounter (Signed)
I spoke to pt and advised her that her RX for adderall is ready for pick up at the front desk and gave clinic hours. I advised her that next year, if her nuvigil is not covered, we can discuss alternatives at that time. Most likely, it will need a pa. Pt verbalized understanding.

## 2016-06-16 NOTE — Telephone Encounter (Signed)
RN receive call from patient about needing refills for adderall 10mg  and 25mg . Pt also stated her nuvigil may be not be covered under her insurance the first of the year. Pt stated she wanted to know if there was a alternative medication, or generic band for nuvigil. Pt wants a call when her prescriptions are ready.

## 2016-06-17 ENCOUNTER — Ambulatory Visit: Payer: Managed Care, Other (non HMO) | Admitting: Neurology

## 2016-06-28 DIAGNOSIS — H04123 Dry eye syndrome of bilateral lacrimal glands: Secondary | ICD-10-CM

## 2016-06-28 HISTORY — DX: Dry eye syndrome of bilateral lacrimal glands: H04.123

## 2016-07-27 ENCOUNTER — Encounter: Payer: Self-pay | Admitting: Neurology

## 2016-07-27 ENCOUNTER — Telehealth: Payer: Self-pay | Admitting: Neurology

## 2016-07-27 NOTE — Telephone Encounter (Signed)
Pt request refill for amphetamine-dextroamphetamine (ADDERALL XR) 25 MG 24 hr capsule and amphetamine-dextroamphetamine (ADDERALL) 10 MG tablet . Pt is wanting to get these at her appt on 2/1 (Thursday)

## 2016-07-28 NOTE — Telephone Encounter (Signed)
We can address this at her appt tomorrow and make sure no medication changes need to be made.

## 2016-07-29 ENCOUNTER — Telehealth: Payer: Self-pay

## 2016-07-29 ENCOUNTER — Encounter: Payer: Self-pay | Admitting: Neurology

## 2016-07-29 ENCOUNTER — Ambulatory Visit (INDEPENDENT_AMBULATORY_CARE_PROVIDER_SITE_OTHER): Payer: Managed Care, Other (non HMO) | Admitting: Neurology

## 2016-07-29 VITALS — BP 118/66 | HR 82 | Resp 14 | Ht 64.0 in | Wt 141.0 lb

## 2016-07-29 DIAGNOSIS — G4711 Idiopathic hypersomnia with long sleep time: Secondary | ICD-10-CM | POA: Diagnosis not present

## 2016-07-29 DIAGNOSIS — G4733 Obstructive sleep apnea (adult) (pediatric): Secondary | ICD-10-CM

## 2016-07-29 DIAGNOSIS — G4753 Recurrent isolated sleep paralysis: Secondary | ICD-10-CM

## 2016-07-29 DIAGNOSIS — Z9989 Dependence on other enabling machines and devices: Secondary | ICD-10-CM | POA: Diagnosis not present

## 2016-07-29 MED ORDER — AMPHETAMINE-DEXTROAMPHETAMINE 10 MG PO TABS: 10.0000 mg | ORAL_TABLET | Freq: Two times a day (BID) | ORAL | 0 refills | Status: DC

## 2016-07-29 MED ORDER — ARMODAFINIL 150 MG PO TABS: 150.0000 mg | ORAL_TABLET | Freq: Every day | ORAL | 5 refills | Status: DC

## 2016-07-29 MED ORDER — AMPHETAMINE-DEXTROAMPHET ER 25 MG PO CP24: 25.0000 mg | ORAL_CAPSULE | Freq: Two times a day (BID) | ORAL | 0 refills | Status: DC
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
## 2016-07-29 NOTE — Patient Instructions (Signed)
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 You do not have any telltale symptoms of narcolepsy at this time.  We will keep your meds the same.

## 2016-07-29 NOTE — Telephone Encounter (Signed)
PA received for Armodafinil. Completed on CoverMyMeds. Key: [id number]

## 2016-08-02 NOTE — Telephone Encounter (Signed)
PA approved from 07/30/2016-07/30/2017. I have faxed response to phamacy.

## 2016-09-01 ENCOUNTER — Telehealth: Payer: Self-pay | Admitting: Neurology

## 2016-09-01 DIAGNOSIS — G4753 Recurrent isolated sleep paralysis: Secondary | ICD-10-CM

## 2016-09-01 DIAGNOSIS — Z9989 Dependence on other enabling machines and devices: Secondary | ICD-10-CM

## 2016-09-01 DIAGNOSIS — G4711 Idiopathic hypersomnia with long sleep time: Secondary | ICD-10-CM

## 2016-09-01 DIAGNOSIS — G4733 Obstructive sleep apnea (adult) (pediatric): Secondary | ICD-10-CM

## 2016-09-01 NOTE — Telephone Encounter (Signed)
Pt request refill for amphetamine-dextroamphetamine (ADDERALL XR) 25 MG 24 hr capsule and amphetamine-dextroamphetamine (ADDERALL) 10 MG tablet

## 2016-09-02 MED ORDER — AMPHETAMINE-DEXTROAMPHETAMINE 10 MG PO TABS: 10.0000 mg | ORAL_TABLET | Freq: Two times a day (BID) | ORAL | 0 refills | Status: DC

## 2016-09-02 MED ORDER — AMPHETAMINE-DEXTROAMPHET ER 25 MG PO CP24: 25.0000 mg | ORAL_CAPSULE | Freq: Two times a day (BID) | ORAL | 0 refills | Status: DC

## 2016-09-02 NOTE — Telephone Encounter (Signed)
LM for patient stating Rxs are at front desk ready for p/u. I left reminder of office hours.

## 2016-09-02 NOTE — Addendum Note (Signed)
Addended by: Crisoforo OxfordURNER, Sanuel Ladnier S on: 09/02/2016 04:11 PM   Modules accepted: Orders

## 2016-09-02 NOTE — Telephone Encounter (Signed)
Rx printed waiting for signature. Once signed I will take to front desk.

## 2016-11-04 ENCOUNTER — Telehealth: Payer: Self-pay | Admitting: Neurology

## 2016-11-04 DIAGNOSIS — G4733 Obstructive sleep apnea (adult) (pediatric): Secondary | ICD-10-CM

## 2016-11-04 DIAGNOSIS — G4753 Recurrent isolated sleep paralysis: Secondary | ICD-10-CM

## 2016-11-04 DIAGNOSIS — G4711 Idiopathic hypersomnia with long sleep time: Secondary | ICD-10-CM

## 2016-11-04 DIAGNOSIS — Z9989 Dependence on other enabling machines and devices: Secondary | ICD-10-CM

## 2016-11-04 MED ORDER — AMPHETAMINE-DEXTROAMPHETAMINE 10 MG PO TABS: 10.0000 mg | ORAL_TABLET | Freq: Two times a day (BID) | ORAL | 0 refills | Status: DC

## 2016-11-04 MED ORDER — AMPHETAMINE-DEXTROAMPHET ER 25 MG PO CP24: 25.0000 mg | ORAL_CAPSULE | Freq: Two times a day (BID) | ORAL | 0 refills | Status: DC

## 2016-11-04 NOTE — Telephone Encounter (Signed)
Pt calling for refill of amphetamine-dextroamphetamine (ADDERALL) 10 MG tablet  amphetamine-dextroamphetamine (ADDERALL XR) 25 MG 24 hr capsule

## 2016-11-04 NOTE — Addendum Note (Signed)
Addended by: Crisoforo OxfordURNER, DIANA S on: 11/04/2016 03:31 PM   Modules accepted: Orders

## 2016-11-04 NOTE — Telephone Encounter (Signed)
Rxs printed, waiting for signature. Once signed I will take to the front desk

## 2016-11-05 ENCOUNTER — Ambulatory Visit (INDEPENDENT_AMBULATORY_CARE_PROVIDER_SITE_OTHER): Payer: Managed Care, Other (non HMO) | Admitting: Psychology

## 2016-11-05 DIAGNOSIS — F330 Major depressive disorder, recurrent, mild: Secondary | ICD-10-CM | POA: Diagnosis not present

## 2016-12-01 ENCOUNTER — Ambulatory Visit (INDEPENDENT_AMBULATORY_CARE_PROVIDER_SITE_OTHER): Payer: Managed Care, Other (non HMO) | Admitting: Psychology

## 2016-12-01 DIAGNOSIS — F330 Major depressive disorder, recurrent, mild: Secondary | ICD-10-CM | POA: Diagnosis not present

## 2016-12-20 ENCOUNTER — Telehealth: Payer: Self-pay | Admitting: Neurology

## 2016-12-20 DIAGNOSIS — G4733 Obstructive sleep apnea (adult) (pediatric): Secondary | ICD-10-CM

## 2016-12-20 DIAGNOSIS — Z9989 Dependence on other enabling machines and devices: Principal | ICD-10-CM

## 2016-12-20 DIAGNOSIS — Z9114 Patient's other noncompliance with medication regimen: Secondary | ICD-10-CM

## 2016-12-20 NOTE — Telephone Encounter (Signed)
Dr. Frances FurbishAthar is out of the office until next week. I will send to Dr. Vickey Hugerohmeier for review. I have pulled the therapy report and have placed on Dr. Oliva Bustardohmeier's desk for review.

## 2016-12-20 NOTE — Telephone Encounter (Signed)
Pt was advised by [facility] to call [facility] about a CPAP reading to ensure she is getting enough oxygen , she was told there is someone here that can read the unit and determine, please call

## 2016-12-21 NOTE — Addendum Note (Signed)
Addended by: Melvyn NovasHMEIER, Letina Luckett on: 12/21/2016 06:18 PM   Modules accepted: Orders

## 2016-12-21 NOTE — Telephone Encounter (Signed)
I reviewed her compliance report for CPAP user [name] [name], her compliance is at only 53% she is using the machine on average 5 hours and 4 minutes, set pressure is 9 cm water with 2 cm EPR she has rather high air leaks at 19.2 L/m, the residual AHI is low at 0.3. These data do not allow conclusions about oxygenation or oxygen saturation. For this an overnight pulse oximetry would be needed performed while the patient is using CPAP. If she has not had such a study I would be ordering a today.CD

## 2016-12-22 NOTE — Telephone Encounter (Signed)
I called pt. I advised her that in Dr. Teofilo PodAthar's absence, Dr. Vickey Hugerohmeier review pt's request and recommended an ONO on CPAP to check for O2 saturation. I advised her [facility] and [facility] should [facility] pt within about one week. I explained also to the pt that she should contact [facility] regarding a mask refit since her leak was high. I again reviewed PAP compliance expectations with the pt and encouraged more compliant usage. Pt verbalized understanding.

## 2016-12-27 ENCOUNTER — Telehealth: Payer: Self-pay | Admitting: Neurology

## 2016-12-27 NOTE — Telephone Encounter (Signed)
Mrs. Wyline MoodRucker has used hr CPAP  machine at a setting of 9 cm water pressure with 2 cm EPR 30 out of 30 days until late June 2018 but only 16 days at 4 hours or longer. The still constitutes a low compliance. Average user time is only 5 hours and 4 minutes. The patient does have a little residual apnea index of 0.3 she does have some nights with high air leakage.  I would recommend that the patient increases her compliance by at least one hour more each night. Melvyn Novasarmen Kylena Mole, MD

## 2016-12-27 NOTE — Telephone Encounter (Signed)
I agree. Please call patient back.

## 2016-12-28 NOTE — Telephone Encounter (Signed)
I called pt. I explained that Dr. Frances FurbishAthar reviewed her cpap compliance and encourages pt to use her cpap at least one more hour per night. Pt says is agreeable to this. [facility] and is asking me to reach out to them again. I will do this today.

## 2017-01-11 ENCOUNTER — Telehealth: Payer: Self-pay | Admitting: Neurology

## 2017-01-11 DIAGNOSIS — G4711 Idiopathic hypersomnia with long sleep time: Secondary | ICD-10-CM

## 2017-01-11 DIAGNOSIS — Z9989 Dependence on other enabling machines and devices: Secondary | ICD-10-CM

## 2017-01-11 DIAGNOSIS — G4753 Recurrent isolated sleep paralysis: Secondary | ICD-10-CM

## 2017-01-11 DIAGNOSIS — G4733 Obstructive sleep apnea (adult) (pediatric): Secondary | ICD-10-CM

## 2017-01-11 MED ORDER — AMPHETAMINE-DEXTROAMPHETAMINE 10 MG PO TABS: 10.0000 mg | ORAL_TABLET | Freq: Two times a day (BID) | ORAL | 0 refills | Status: DC

## 2017-01-11 MED ORDER — AMPHETAMINE-DEXTROAMPHET ER 25 MG PO CP24: 25.0000 mg | ORAL_CAPSULE | Freq: Two times a day (BID) | ORAL | 0 refills | Status: DC

## 2017-01-11 NOTE — Telephone Encounter (Signed)
I called pt and advised her that her RXs for adderall are ready for pick up at the front desk and gave her clinic hours. Pt still has not heard from [facility] regarding her ONO. [facility]. Pt verbalized understanding.

## 2017-01-11 NOTE — Telephone Encounter (Signed)
Patient called office requesting refill for amphetamine-dextroamphetamine (ADDERALL) 10 MG tablet and amphetamine-dextroamphetamine (ADDERALL XR) 25 MG 24 hr capsule.

## 2017-01-11 NOTE — Addendum Note (Signed)
Addended by: Geronimo RunningINKINS, Aryana Wonnacott A on: 01/11/2017 11:50 AM   Modules accepted: Orders

## 2017-01-11 NOTE — Telephone Encounter (Signed)
Pt is due for a refill on her adderall and is up to date on her appts. Will print RX and give to Dr. Frances FurbishAthar for a signature.

## 2017-01-24 ENCOUNTER — Ambulatory Visit (INDEPENDENT_AMBULATORY_CARE_PROVIDER_SITE_OTHER): Payer: Managed Care, Other (non HMO) | Admitting: Psychology

## 2017-01-24 DIAGNOSIS — F330 Major depressive disorder, recurrent, mild: Secondary | ICD-10-CM

## 2017-01-25 NOTE — Telephone Encounter (Signed)
Patient is calling to discuss oxygen saturation.

## 2017-01-25 NOTE — Telephone Encounter (Signed)
I called pt. She wants to schedule her ONO pick up. I advised her that [facility]. Pt verbalized understanding.

## 2017-01-27 ENCOUNTER — Other Ambulatory Visit: Payer: Self-pay | Admitting: Neurology

## 2017-01-27 ENCOUNTER — Telehealth: Payer: Self-pay | Admitting: Neurology

## 2017-01-27 DIAGNOSIS — G4733 Obstructive sleep apnea (adult) (pediatric): Secondary | ICD-10-CM

## 2017-01-27 DIAGNOSIS — G4711 Idiopathic hypersomnia with long sleep time: Secondary | ICD-10-CM

## 2017-01-27 DIAGNOSIS — Z9989 Dependence on other enabling machines and devices: Principal | ICD-10-CM

## 2017-01-27 DIAGNOSIS — G4753 Recurrent isolated sleep paralysis: Secondary | ICD-10-CM

## 2017-01-27 NOTE — Telephone Encounter (Signed)
Pt called the office said she had filed a [facility] with [facility]. She said they have not been compliant with returning her calls and getting her equipment to her. Her insurance co Counselling [employer]) will work with [facility]  This is future information.  FYI

## 2017-01-27 NOTE — Telephone Encounter (Signed)
I called pt. [facility] has not returned her calls. Pt still does not have her ONO completed. I recommended that we switch her to [facility]. Pt is agreeable to this. I advised her that [facility] will be taking over her cpap care and supplies and will be able to provide an ONO for her. Pt is agreeable to this and verbalized understanding that [facility] will be calling her within about one week.

## 2017-01-27 NOTE — Telephone Encounter (Signed)
RX for nuvigil signed by Dr. Frances FurbishAthar. Faxed to [facility]. Received a receipt of confirmation.

## 2017-01-27 NOTE — Telephone Encounter (Signed)
Pt is up to date on her appts and is due for a refill on her nuvigil. Printed RX and will give to Dr. Frances FurbishAthar for review and signature.

## 2017-02-01 ENCOUNTER — Encounter: Payer: Self-pay | Admitting: Neurology

## 2017-02-07 ENCOUNTER — Encounter: Payer: Self-pay | Admitting: Neurology

## 2017-02-07 ENCOUNTER — Ambulatory Visit (INDEPENDENT_AMBULATORY_CARE_PROVIDER_SITE_OTHER): Payer: Managed Care, Other (non HMO) | Admitting: Neurology

## 2017-02-07 VITALS — BP 121/76 | HR 98 | Ht 64.0 in | Wt 139.0 lb

## 2017-02-07 DIAGNOSIS — Z9989 Dependence on other enabling machines and devices: Secondary | ICD-10-CM

## 2017-02-07 DIAGNOSIS — G4711 Idiopathic hypersomnia with long sleep time: Secondary | ICD-10-CM | POA: Diagnosis not present

## 2017-02-07 DIAGNOSIS — G4733 Obstructive sleep apnea (adult) (pediatric): Secondary | ICD-10-CM | POA: Diagnosis not present

## 2017-02-07 MED ORDER — AMPHETAMINE-DEXTROAMPHET ER 25 MG PO CP24: 25.0000 mg | ORAL_CAPSULE | Freq: Two times a day (BID) | ORAL | 0 refills | Status: DC

## 2017-02-07 MED ORDER — AMPHETAMINE-DEXTROAMPHETAMINE 10 MG PO TABS: 10.0000 mg | ORAL_TABLET | Freq: Two times a day (BID) | ORAL | 0 refills | Status: DC
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
## 2017-02-07 NOTE — Patient Instructions (Addendum)
Please continue using your CPAP regularly. While your insurance requires that you use CPAP at least 4 hours each night on 70% of the nights, I recommend, that you not skip any nights and use it throughout the night if you can. Getting used to CPAP and staying with the treatment long term does take time and patience and discipline. Untreated obstructive sleep apnea when it is moderate to severe can have an adverse impact on cardiovascular health and raise her risk for heart disease, arrhythmias, hypertension, congestive heart failure, stroke and diabetes. Untreated obstructive sleep apnea causes sleep disruption, nonrestorative sleep, and sleep deprivation. This can have an impact on your day to day functioning and cause daytime sleepiness and impairment of cognitive function, memory loss, mood disturbance, and problems focussing. Using CPAP regularly can improve these symptoms.   We will continue with your meds: Adderall XR, Adderall IR.   We will try to get you a better mask fit.

## 2017-03-02 ENCOUNTER — Telehealth: Payer: Self-pay | Admitting: Neurology

## 2017-03-02 NOTE — Telephone Encounter (Signed)
I reviewed patient's overnight pulse oximetry test results from 02/06/2017 through 02/07/2017. Patient was using her CPAP at the time and duration of test was 5 hours and 26 minutes, average oxygen saturation 93.8%, nadir of 84% which looks almost like an error on the graph, time below 88% saturation was 0.1 minute. Please advise patient that she did not have any significant desaturations while using her CPAP and no further action is required other than being compliant with CPAP and trying to achieve 7-8 hours of sleep.

## 2017-03-02 NOTE — Telephone Encounter (Signed)
I called pt. I advised her that her ONO results did not show any significant desaturations while using her cpap and no further action is required other than being compliant with CPAP and trying to achieve 7-8 hours of sleep. Pt verbalized understanding of results. Pt had no questions at this time but was encouraged to call back if questions arise.

## 2017-03-23 ENCOUNTER — Other Ambulatory Visit: Payer: Self-pay | Admitting: Obstetrics and Gynecology

## 2017-03-23 DIAGNOSIS — Z1231 Encounter for screening mammogram for malignant neoplasm of breast: Secondary | ICD-10-CM

## 2017-04-04 ENCOUNTER — Telehealth: Payer: Self-pay | Admitting: Neurology

## 2017-04-04 DIAGNOSIS — G4733 Obstructive sleep apnea (adult) (pediatric): Secondary | ICD-10-CM

## 2017-04-04 DIAGNOSIS — Z9989 Dependence on other enabling machines and devices: Secondary | ICD-10-CM

## 2017-04-04 DIAGNOSIS — G4711 Idiopathic hypersomnia with long sleep time: Secondary | ICD-10-CM

## 2017-04-04 MED ORDER — AMPHETAMINE-DEXTROAMPHETAMINE 10 MG PO TABS: 10.0000 mg | ORAL_TABLET | Freq: Two times a day (BID) | ORAL | 0 refills | Status: DC

## 2017-04-04 MED ORDER — AMPHETAMINE-DEXTROAMPHET ER 25 MG PO CP24: 25.0000 mg | ORAL_CAPSULE | Freq: Two times a day (BID) | ORAL | 0 refills | Status: DC

## 2017-04-04 NOTE — Addendum Note (Signed)
Addended by: Geronimo Running A on: 04/04/2017 02:23 PM   Modules accepted: Orders

## 2017-04-04 NOTE — Telephone Encounter (Signed)
Dr. Frances Furbish is out of the office, RX for adderall is due for refill and pt is up to date on her appts. Will print RX in Dr. Oliva Bustard, [facility] and

## 2017-04-04 NOTE — Telephone Encounter (Signed)
I called pt, advised her that her adderall RXs are available for pick up at the front desk. Pt verbalized understanding.

## 2017-04-04 NOTE — Telephone Encounter (Signed)
Patient called office requesting refill for amphetamine-dextroamphetamine (ADDERALL) 10 MG tablet and amphetamine-dextroamphetamine (ADDERALL XR) 25 MG 24 hr capsule

## 2017-04-15 ENCOUNTER — Ambulatory Visit
Admission: RE | Admit: 2017-04-15 | Discharge: 2017-04-15 | Disposition: A | Discharge status: Home / Self Care | Payer: Managed Care, Other (non HMO) | Source: Ambulatory Visit | Attending: Obstetrics and Gynecology | Admitting: Obstetrics and Gynecology

## 2017-04-15 ENCOUNTER — Ambulatory Visit (INDEPENDENT_AMBULATORY_CARE_PROVIDER_SITE_OTHER): Payer: Managed Care, Other (non HMO) | Admitting: Obstetrics and Gynecology

## 2017-04-15 ENCOUNTER — Encounter: Payer: Self-pay | Admitting: Obstetrics and Gynecology

## 2017-04-15 ENCOUNTER — Other Ambulatory Visit (HOSPITAL_COMMUNITY)
Admission: RE | Admit: 2017-04-15 | Discharge: 2017-04-15 | Disposition: A | Discharge status: Home / Self Care | Payer: Managed Care, Other (non HMO) | Source: Ambulatory Visit | Attending: Obstetrics and Gynecology | Admitting: Obstetrics and Gynecology

## 2017-04-15 VITALS — BP 100/60 | HR 90 | Resp 18 | Ht 65.5 in | Wt 128.0 lb

## 2017-04-15 DIAGNOSIS — Z01419 Encounter for gynecological examination (general) (routine) without abnormal findings: Secondary | ICD-10-CM

## 2017-04-15 DIAGNOSIS — Z1231 Encounter for screening mammogram for malignant neoplasm of breast: Secondary | ICD-10-CM

## 2017-04-15 MED ORDER — ESTROGENS CONJUGATED 0.9 MG PO TABS: 0.9000 mg | ORAL_TABLET | Freq: Every day | ORAL | 11 refills | Status: DC

## 2017-04-15 NOTE — Progress Notes (Signed)
51 y.o. G0P0000 Divorced [place] American female here for annual exam.    On ERT.   Will start working out in a gym.   Using continuous glucose monitoring for her DM.  A1C is about 6.9.  Labs with PCP.   She had her flu vaccine.   ROS - bloating and constipation.  Taking Trulance.   SOC - living in [place]. Same partner since 2011.   He lives in [place].   PCP: Adrian Prince, MD    Patient's last menstrual period was 06/28/2000.           Sexually active: Yes.   female The current method of family planning is status post hysterectomy/condoms.    Exercising: Yes.    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 02/2013 Ascus with Neg HR HPV. MMG:  04/15/17 BIRADS 1 negative/density d Colonoscopy:  05-02-13 normal with Dr.Mann;next due 04/2020 BMD:   n/a  Result  n/a TDaP: 03-09-13 Gardasil:   n/a HIV: 2-3- years ago--Neg Hep C: Unsure Screening Labs:  PCP   reports that she has never smoked. She has never used smokeless tobacco. She reports that she does not drink alcohol or use drugs.  Past Medical History:  Diagnosis Date  . Breast cyst    left  . 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 Diabetes mellitus   . Dry eyes, bilateral 2018  . Ketoacidosis 5366,4403  . Meningitis 06/2001   --hospital  . Sleep apnea 2013  . 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 Stress   . Thyroid disease    hypothyroidism  . VAIN I (vaginal intraepithelial neoplasia grade I) 2013    Past Surgical History:  Procedure Laterality Date  . ABDOMINAL HYSTERECTOMY     2004  . CERVICAL BIOPSY  W/ LOOP ELECTRODE EXCISION  01-24-95   CIN I  . COLPOSCOPY  11-21-02   CIN III  . COLPOSCOPY  05-05-12   no lesions--needs repeat pap 08/2012 per Dr. Tresa Res  . HAND  SURGERY  2004   tore tendon  . positive HPV  03/21/15   normal pap and negative types 16/18  . SOFT TISSUE CYST EXCISION  12/2010   left breast epidermoid inclusion cyst  . SPINE SURGERY  05/21/14  . TONSILLECTOMY AND ADENOIDECTOMY      Current Outpatient Prescriptions  Medication Sig Dispense Refill  . ALPRAZolam (XANAX) 0.5 MG tablet Take 0.25 mg by mouth as needed.     Marland Kitchen amphetamine-dextroamphetamine (ADDERALL XR) 25 MG 24 hr capsule Take 1 capsule by mouth 2 (two) times daily. 60 capsule 0  . amphetamine-dextroamphetamine (ADDERALL) 10 MG tablet Take 1 tablet (10 mg total) by mouth 2 (two) times daily. 60 tablet 0  . Armodafinil 150 MG tablet TAKE ONE TABLET BY MOUTH DAILY 30 tablet 5  . BAYER CONTOUR NEXT TEST test strip     . buPROPion (WELLBUTRIN SR) 150 MG 12 hr tablet Take 2 tablets by mouth daily.     Marland Kitchen estrogens, conjugated, (PREMARIN) 1.25 MG tablet Take 1 tablet (1.25 mg total) by mouth daily. 90 tablet 3  . insulin lispro (HUMALOG) 100 UNIT/ML injection Inject 25 Units into the skin daily.     Marland Kitchen levothyroxine (SYNTHROID, LEVOTHROID) 112  MCG tablet Take 112 mcg by mouth daily.      . TRULANCE 3 MG TABS Take 1 tablet by mouth daily.    . Vitamin D, Ergocalciferol, (DRISDOL) 50000 units CAPS capsule     . XIIDRA 5 % SOLN Apply to eye 2 (two) times daily.     No current facility-administered medications for this visit.     Family History  Problem Relation Age of Onset  . Other Mother        cysts  . Diabetes Father   . Kidney disease Father   . Stroke Paternal Aunt   . Breast cancer Neg Hx     ROS:  Pertinent items are noted in HPI.  Otherwise, a comprehensive ROS was negative.  Exam:   BP 100/60 (BP Location: Right Arm, Patient Position: Sitting, Cuff Size: Normal)   Pulse 90   Resp 18   Ht 5' 5.5" (1.664 m)   Wt 128 lb (58.1 kg)   LMP 06/28/2000   BMI 20.98 kg/m     General appearance: alert, cooperative and appears stated age Head: Normocephalic, without  obvious abnormality, atraumatic Neck: no adenopathy, supple, symmetrical, trachea midline and thyroid normal to inspection and palpation Lungs: clear to auscultation bilaterally Breasts: normal appearance, no masses or tenderness, No nipple retraction or dimpling, No nipple discharge or bleeding, No axillary or supraclavicular adenopathy Heart: regular rate and rhythm Abdomen: soft, non-tender; no masses, no organomegaly Extremities: extremities normal, atraumatic, no cyanosis or edema Skin: Skin color, texture, turgor normal. No rashes or lesions Lymph nodes: Cervical, supraclavicular, and axillary nodes normal. No abnormal inguinal nodes palpated Neurologic: Grossly normal  Pelvic: External genitalia:  no lesions              Urethra:  normal appearing urethra with no masses, tenderness or lesions              Bartholins and Skenes: normal                 Vagina: normal appearing vagina with normal color and white creamy discharge, no lesions              Cervix: absent.               Pap taken: Yes.   Bimanual Exam:  Uterus:   Absent.               Adnexa: no mass, fullness, tenderness              Rectal exam: Yes.  .  Confirms.              Anus:  normal sphincter tone, no lesions  Chaperone was present for exam.  Assessment:   Well woman visit with normal exam. Status post TVH.  History of CIN III and VAIN I.  Pap 2016 normal and positive HR HPV, negative 16/18. ERT patient.   HSV.   DM. Varicose veins.   Plan: Mammogram screening discussed. Recommended self breast awareness. Pap and HR HPV as above. Guidelines for Calcium, Vitamin D, regular exercise program including cardiovascular and weight bearing exercise. We will start reducing her ERT.  She will go down to Premarin 0.9 mg daily.  We discussed increased risk of stroke, DVT, and PE with ERT.  Follow up annually and prn.   After visit summary provided.

## 2017-04-15 NOTE — Patient Instructions (Signed)

## 2017-04-18 DIAGNOSIS — R410 Disorientation, unspecified: Secondary | ICD-10-CM | POA: Insufficient documentation

## 2017-04-18 DIAGNOSIS — G4733 Obstructive sleep apnea (adult) (pediatric): Secondary | ICD-10-CM | POA: Insufficient documentation

## 2017-04-19 LAB — CYTOLOGY - PAP
Diagnosis: NEGATIVE
HPV: NOT DETECTED

## 2017-05-02 ENCOUNTER — Telehealth: Payer: Self-pay | Admitting: Neurology

## 2017-05-02 DIAGNOSIS — G4711 Idiopathic hypersomnia with long sleep time: Secondary | ICD-10-CM

## 2017-05-02 DIAGNOSIS — G4733 Obstructive sleep apnea (adult) (pediatric): Secondary | ICD-10-CM

## 2017-05-02 DIAGNOSIS — Z9989 Dependence on other enabling machines and devices: Secondary | ICD-10-CM

## 2017-05-02 MED ORDER — AMPHETAMINE-DEXTROAMPHET ER 25 MG PO CP24: 25.0000 mg | ORAL_CAPSULE | Freq: Two times a day (BID) | ORAL | 0 refills | Status: DC

## 2017-05-02 MED ORDER — AMPHETAMINE-DEXTROAMPHETAMINE 10 MG PO TABS: 10.0000 mg | ORAL_TABLET | Freq: Two times a day (BID) | ORAL | 0 refills | Status: DC

## 2017-05-02 NOTE — Telephone Encounter (Signed)
Rx's printed and given to Dr. Frances FurbishAthar to sign.

## 2017-05-02 NOTE — Addendum Note (Signed)
Addended by: Rocky LinkGANN, Antwian Santaana A on: 05/02/2017 02:50 PM   Modules accepted: Orders

## 2017-05-02 NOTE — Telephone Encounter (Signed)
Pt calling for refills of  amphetamine-dextroamphetamine (ADDERALL XR) 25 MG 24 hr capsule    amphetamine-dextroamphetamine (ADDERALL) 10 MG tablet

## 2017-05-02 NOTE — Telephone Encounter (Signed)
Patient is aware that Rx's are ready and at the front desk.

## 2017-05-03 ENCOUNTER — Telehealth: Payer: Self-pay | Admitting: Neurology

## 2017-05-03 NOTE — Telephone Encounter (Signed)
I sent patient a MyChart message with Dr. Teofilo PodAthar's response.

## 2017-05-03 NOTE — Telephone Encounter (Signed)
Dr. Frances FurbishAthar, have you received anything from Dr. Evlyn [facility] or do you recall this patient having cognitive testing done?

## 2017-05-03 NOTE — Telephone Encounter (Signed)
I do not recall seeing any new notes from Dr. Evlyn [facility]anner[facility]. Here is what she had before:   She was seen by Dr. Leonides CaveZelson for neuropsychological testing on 08/08/2014 and then in follow-up and discussion on 08/15/2014: Her neuropsychological test profile was abnormal, primarily due to impairments were memory storage and nonverbal executive functioning. She demonstrated forgetting of both auditory and visual information after a delay. Her blood sugar monitoring device alarm went off several times during the test session on 08/08/2014. The conclusion was that her is neuropsychological profile was difficult to explain. One could consider the effects of her type 1 diabetes, as studies have shown that many adults with type 1 diabetes show modest cognitive deficits on a wide range of neuropsychological tests. 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   We can consider repeating this. I am not sure, what she was in the hospital for?  She had been doing well from my end.

## 2017-05-16 ENCOUNTER — Telehealth: Payer: Self-pay | Admitting: Neurology

## 2017-05-16 NOTE — Telephone Encounter (Signed)
I called pt and advised her that Dr. Frances FurbishAthar recommends changing the filter more often and taking extra filters. I recommended that she also check with [facility]. Pt verbalized understanding.

## 2017-05-16 NOTE — Telephone Encounter (Signed)
I really don't have any additional suggestions other than changing filter more frequently, and take extra filters. She can also check with her DME company for advice in that regard.
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
## 2017-05-16 NOTE — Telephone Encounter (Signed)
Patient will be flying to [place] for a week and needs to discuss her CPAP because of the air quality there.

## 2017-06-02 ENCOUNTER — Institutional Professional Consult (permissible substitution): Payer: Managed Care, Other (non HMO) | Admitting: Neurology

## 2017-06-02 ENCOUNTER — Telehealth: Payer: Self-pay | Admitting: Neurology

## 2017-06-02 DIAGNOSIS — G4733 Obstructive sleep apnea (adult) (pediatric): Secondary | ICD-10-CM

## 2017-06-02 DIAGNOSIS — G4711 Idiopathic hypersomnia with long sleep time: Secondary | ICD-10-CM

## 2017-06-02 DIAGNOSIS — Z9989 Dependence on other enabling machines and devices: Secondary | ICD-10-CM

## 2017-06-02 MED ORDER — AMPHETAMINE-DEXTROAMPHETAMINE 10 MG PO TABS: 10.0000 mg | ORAL_TABLET | Freq: Two times a day (BID) | ORAL | 0 refills | Status: DC

## 2017-06-02 MED ORDER — AMPHETAMINE-DEXTROAMPHET ER 25 MG PO CP24: 25.0000 mg | ORAL_CAPSULE | Freq: Two times a day (BID) | ORAL | 0 refills | Status: DC

## 2017-06-02 NOTE — Telephone Encounter (Signed)
I called pt and advised her that her adderall RX are ready for pick up at the front desk. Pt verbalized understanding.

## 2017-06-02 NOTE — Telephone Encounter (Signed)
Pt is requesting refill for amphetamine-dextroamphetamine (ADDERALL XR) 25 MG 24 hr capsule and amphetamine-dextroamphetamine (ADDERALL) 10 MG tablet

## 2017-06-02 NOTE — Telephone Encounter (Signed)
Pt is up to date on her appts and is due for a refill on her adderall.

## 2017-06-06 ENCOUNTER — Institutional Professional Consult (permissible substitution): Payer: Managed Care, Other (non HMO) | Admitting: Neurology

## 2017-06-07 ENCOUNTER — Encounter: Payer: Self-pay | Admitting: Neurology

## 2017-06-09 ENCOUNTER — Ambulatory Visit (INDEPENDENT_AMBULATORY_CARE_PROVIDER_SITE_OTHER): Payer: Managed Care, Other (non HMO) | Admitting: Neurology

## 2017-06-09 ENCOUNTER — Encounter: Payer: Self-pay | Admitting: Neurology

## 2017-06-09 VITALS — BP 110/70 | HR 89 | Ht 65.0 in | Wt 125.0 lb

## 2017-06-09 DIAGNOSIS — R410 Disorientation, unspecified: Secondary | ICD-10-CM | POA: Diagnosis not present

## 2017-06-09 DIAGNOSIS — Z9989 Dependence on other enabling machines and devices: Secondary | ICD-10-CM

## 2017-06-09 DIAGNOSIS — G4733 Obstructive sleep apnea (adult) (pediatric): Secondary | ICD-10-CM

## 2017-06-09 NOTE — Patient Instructions (Addendum)
Please continue using your CPAP regularly. While your insurance requires that you use CPAP at least 4 hours each night on 70% of the nights, I recommend, that you not skip any nights and use it throughout the night if you can. Getting used to CPAP and staying with the treatment long term does take time and patience and discipline. Untreated obstructive sleep apnea when it is moderate to severe can have an adverse impact on cardiovascular health and raise her risk for heart disease, arrhythmias, hypertension, congestive heart failure, stroke and diabetes. Untreated obstructive sleep apnea causes sleep disruption, nonrestorative sleep, and sleep deprivation. This can have an impact on your day to day functioning and cause daytime sleepiness and impairment of cognitive function, memory loss, mood disturbance, and problems focussing. Using CPAP regularly can improve these symptoms.  We will do an EEG (brainwave test), which we will schedule. We will call you with the results.  We will also do another formal neuropsychological test (aka cognitive testing) for your memory complaints. This requires a referral to a trained and licensed neuropsychologist and will be a separate appointment at a different clinic.
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
## 2017-06-15 ENCOUNTER — Ambulatory Visit (INDEPENDENT_AMBULATORY_CARE_PROVIDER_SITE_OTHER): Payer: Managed Care, Other (non HMO) | Admitting: Neurology

## 2017-06-15 DIAGNOSIS — Z9989 Dependence on other enabling machines and devices: Principal | ICD-10-CM

## 2017-06-15 DIAGNOSIS — R410 Disorientation, unspecified: Secondary | ICD-10-CM | POA: Diagnosis not present

## 2017-06-15 DIAGNOSIS — G4733 Obstructive sleep apnea (adult) (pediatric): Secondary | ICD-10-CM

## 2017-06-16 ENCOUNTER — Telehealth: Payer: Self-pay

## 2017-06-16 NOTE — Telephone Encounter (Signed)
-----   Message from Saima Athar, MD sHuston Foleyent at 06/16/2017  1:00 PM EST ----- Please call and advise the patient that the EEG or brain wave test we performed was reported as normal in the awake and asleep states. We checked for abnormal electrical discharges in the brain waves and the report suggested normal findings. No further action is required on this test at this time. Please remind patient to keep any upcoming appointments or tests and to call us with any interim questions, concerns, problems or updates. Thanks,  Huston FoleySaima Athar, MD, PhD

## 2017-06-16 NOTE — Procedures (Signed)
   HISTORY: 51 year old female, history of excessive daytime somnolence, has confusion episodes,  TECHNIQUE:  16 channel EEG was performed based on standard 10-16 international system. One channel was dedicated to EKG, which has demonstrates normal sinus rhythm of 90 beats per minutes.  Upon awakening, the posterior background activity was well-developed, with mixed alpha and beta range activity, reactive to eye opening and closure.  There was no evidence of epileptiform discharge.  Photic stimulation was performed, which induced a symmetric photic driving.  Hyperventilation was performed, there was no abnormality elicit.  Stage II sleep was achieved.  CONCLUSION: This is a  normal awake and sleep EEG.  There is no electrodiagnostic evidence of epileptiform discharge.  Levert FeinsteinYijun Itha Kroeker, M.D. Ph.D.  [facility] [address] [place], [place] [address] Phone: [phone] Fax:      [phone]

## 2017-06-16 NOTE — Progress Notes (Signed)
Please call and advise the patient that the EEG or brain wave test we performed was reported as normal in the awake and asleep states. We checked for abnormal electrical discharges in the brain waves and the report suggested normal findings. No further action is required on this test at this time. Please remind patient to keep any upcoming appointments or tests and to call us with any interim questions, concerns, problems or updates. Thanks,  Eulan Heyward, MD, PhD  

## 2017-06-16 NOTE — Telephone Encounter (Signed)
I called pt. I advised her that her EEG was normal. Pt verbalized understanding of results. Pt had no questions at this time but was encouraged to call back if questions arise.

## 2017-06-27 ENCOUNTER — Other Ambulatory Visit: Payer: Self-pay | Admitting: Obstetrics and Gynecology

## 2017-07-18 ENCOUNTER — Telehealth: Payer: Self-pay | Admitting: Neurology

## 2017-07-18 DIAGNOSIS — G4711 Idiopathic hypersomnia with long sleep time: Secondary | ICD-10-CM

## 2017-07-18 DIAGNOSIS — G4733 Obstructive sleep apnea (adult) (pediatric): Secondary | ICD-10-CM

## 2017-07-18 DIAGNOSIS — Z9989 Dependence on other enabling machines and devices: Secondary | ICD-10-CM

## 2017-07-18 MED ORDER — AMPHETAMINE-DEXTROAMPHET ER 25 MG PO CP24: 25.0000 mg | ORAL_CAPSULE | Freq: Two times a day (BID) | ORAL | 0 refills | Status: DC

## 2017-07-18 MED ORDER — AMPHETAMINE-DEXTROAMPHETAMINE 10 MG PO TABS: 10.0000 mg | ORAL_TABLET | Freq: Two times a day (BID) | ORAL | 0 refills | Status: DC

## 2017-07-18 NOTE — Telephone Encounter (Signed)
Pt is due for a refill and up to date on her appts. Will print RX and give to Dr. Frances FurbishAthar for review and signature.

## 2017-07-18 NOTE — Telephone Encounter (Signed)
Pt is requesting refill for amphetamine-dextroamphetamine (ADDERALL) 10 MG tablet and amphetamine-dextroamphetamine (ADDERALL XR) 25 MG 24 hr capsule. Pt said she will be coming by on 07/20/17 to pick them up

## 2017-07-19 NOTE — Telephone Encounter (Signed)
I called pt, left a detailed message on her cell phone, per DPR, advising her that her RX is available for pick up at the front desk and gave her clinic hours.

## 2017-08-12 ENCOUNTER — Telehealth: Payer: Self-pay | Admitting: Neurology

## 2017-08-12 DIAGNOSIS — G4711 Idiopathic hypersomnia with long sleep time: Secondary | ICD-10-CM

## 2017-08-12 DIAGNOSIS — Z9989 Dependence on other enabling machines and devices: Secondary | ICD-10-CM

## 2017-08-12 DIAGNOSIS — G4733 Obstructive sleep apnea (adult) (pediatric): Secondary | ICD-10-CM

## 2017-08-12 MED ORDER — AMPHETAMINE-DEXTROAMPHETAMINE 10 MG PO TABS: 10.0000 mg | ORAL_TABLET | Freq: Two times a day (BID) | ORAL | 0 refills | Status: DC

## 2017-08-12 MED ORDER — AMPHETAMINE-DEXTROAMPHET ER 25 MG PO CP24: 25.0000 mg | ORAL_CAPSULE | Freq: Two times a day (BID) | ORAL | 0 refills | Status: DC

## 2017-08-12 NOTE — Telephone Encounter (Signed)
Rx's up front [facility]/fim 

## 2017-08-12 NOTE — Telephone Encounter (Signed)
Pt requesting a refill for amphetamine-dextroamphetamine (ADDERALL XR) 25 MG 24 hr capsule and amphetamine-dextroamphetamine (ADDERALL) 10 MG tablet. Pt has an appt Monday 08/15/17 and will pick them up then

## 2017-08-12 NOTE — Telephone Encounter (Signed)
Rx's awaiting RAS sig/fim 

## 2017-08-15 ENCOUNTER — Encounter: Payer: Self-pay | Admitting: Neurology

## 2017-08-15 ENCOUNTER — Ambulatory Visit: Payer: Managed Care, Other (non HMO) | Admitting: Neurology

## 2017-08-15 VITALS — BP 100/62 | HR 72 | Ht 64.0 in | Wt 132.0 lb

## 2017-08-15 DIAGNOSIS — G4711 Idiopathic hypersomnia with long sleep time: Secondary | ICD-10-CM | POA: Diagnosis not present

## 2017-08-15 DIAGNOSIS — Z9989 Dependence on other enabling machines and devices: Secondary | ICD-10-CM

## 2017-08-15 DIAGNOSIS — R410 Disorientation, unspecified: Secondary | ICD-10-CM | POA: Diagnosis not present

## 2017-08-15 DIAGNOSIS — G4733 Obstructive sleep apnea (adult) (pediatric): Secondary | ICD-10-CM | POA: Diagnosis not present

## 2017-08-15 NOTE — Progress Notes (Signed)
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 chart for details).

## 2017-08-15 NOTE — Patient Instructions (Addendum)
Let's keep everything the same: CPAP settings look good and leak acceptable.   Please remember to make enough time for sleep: 7-8 hours.   Please take your Adderall IR and XR as before: 20 mg in AM and 50 mg at work, and your generic Nuvigil 150 mg at lunch.   Healthy lifestyle is very important for you, since you report a family history of sleep apnea and dementia.

## 2017-08-24 NOTE — Telephone Encounter (Signed)
Error

## 2017-09-20 DIAGNOSIS — I959 Hypotension, unspecified: Secondary | ICD-10-CM | POA: Insufficient documentation

## 2017-09-20 DIAGNOSIS — A419 Sepsis, unspecified organism: Secondary | ICD-10-CM | POA: Insufficient documentation

## 2017-09-20 DIAGNOSIS — R739 Hyperglycemia, unspecified: Secondary | ICD-10-CM | POA: Insufficient documentation

## 2017-09-20 DIAGNOSIS — N179 Acute kidney failure, unspecified: Secondary | ICD-10-CM | POA: Insufficient documentation

## 2017-09-21 DIAGNOSIS — L0390 Cellulitis, unspecified: Secondary | ICD-10-CM | POA: Insufficient documentation

## 2017-09-21 DIAGNOSIS — E0810 Diabetes mellitus due to underlying condition with ketoacidosis without coma: Secondary | ICD-10-CM | POA: Insufficient documentation

## 2017-09-22 DIAGNOSIS — Z9641 Presence of insulin pump (external) (internal): Secondary | ICD-10-CM | POA: Insufficient documentation

## 2017-09-28 ENCOUNTER — Other Ambulatory Visit: Payer: Self-pay | Admitting: Neurology

## 2017-09-28 DIAGNOSIS — G4733 Obstructive sleep apnea (adult) (pediatric): Secondary | ICD-10-CM

## 2017-09-28 DIAGNOSIS — G4711 Idiopathic hypersomnia with long sleep time: Secondary | ICD-10-CM

## 2017-09-28 DIAGNOSIS — Z9989 Dependence on other enabling machines and devices: Secondary | ICD-10-CM

## 2017-09-28 MED ORDER — AMPHETAMINE-DEXTROAMPHET ER 25 MG PO CP24: 25.0000 mg | ORAL_CAPSULE | Freq: Two times a day (BID) | ORAL | 0 refills | Status: DC

## 2017-09-28 MED ORDER — AMPHETAMINE-DEXTROAMPHETAMINE 10 MG PO TABS: 10.0000 mg | ORAL_TABLET | Freq: Two times a day (BID) | ORAL | 0 refills | Status: DC

## 2017-09-28 NOTE — Telephone Encounter (Signed)
I called pt and advised her that the RXs for adderall are ready for pick up at the front desk. Pt verbalized understanding.

## 2017-09-28 NOTE — Telephone Encounter (Signed)
Patient requesting refill of amphetamine-dextroamphetamine (ADDERALL XR) 25 MG 24 hr capsule and amphetamine-dextroamphetamine (ADDERALL) 10 MG tablet.

## 2017-09-28 NOTE — Addendum Note (Signed)
Addended by: Geronimo RunningINKINS, Jala Dundon A on: 09/28/2017 11:31 AM   Modules accepted: Orders

## 2017-10-25 ENCOUNTER — Encounter: Payer: Self-pay | Admitting: Psychology

## 2017-10-25 ENCOUNTER — Ambulatory Visit: Payer: Managed Care, Other (non HMO) | Admitting: Psychology

## 2017-10-25 DIAGNOSIS — R410 Disorientation, unspecified: Secondary | ICD-10-CM

## 2017-10-25 DIAGNOSIS — R413 Other amnesia: Secondary | ICD-10-CM

## 2017-10-25 NOTE — Progress Notes (Signed)
NEUROBEHAVIORAL STATUS EXAM   Name: Gala Padovano Date of Birth: Jan 17, 1966 Date of Interview: 10/25/2017  Reason for Referral:  Jillann Charette is a 52 y.o. female who is referred for neuropsychological evaluation by Dr. Huston Foley of [facility] due to concerns about cognitive impairment. This patient is unaccompanied in the office for today's appointment.  History of Presenting Problem:  Ms. Joe has a history of Type 1 DM, OSA on CPAP, excessive daytime fatigue (on Adderall and Nuvigal), and hypothyroidism. She is followed by Dr. Frances Furbish and reported onset of cognitive difficulties in 2014. Brain MRI in 12/2013 was normal. She reported that for the first time in her life she was given verbal warnings at work in September 2015 by her supervisor regarding below expected job performance. She was evaluated by Dr. Leonides Cave in 07/2014. She was medicated with usual dose of Adderall and Nuvigil for this exam. Her neuropsychological test profile was abnormal with impairments in memory storage and executive functioning. Etiology was unclear. There was no evidence of primary psychiatric disorder. Underlying neurodegenerative condition could not be ruled out. Neuropsychological re-evaluation the future was recommended. EEG completed in 08/2014 was normal. She was terminated from her job. In hindsight, the patient states that this may have been related to interpersonal issues with her supervisor rather than shortcomings on her own part. The patient states that being let go from that job was actually "the best thing that could have happened". She got a new job in [place] and has been working there full time for the last 3 years. 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 She reports problems with "confusion and not  remembering". She reports more recently she has been forgetting to do very simple, routine tasks like zipping her pants (this happened twice this past week). She can no longer perform simple mathematical calculations in her head. 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 She used to be able to change the programming on her insulin pump and can no longer do that either. She has to have a pump specialist help. She bought a new laptop last week and is having trouble downloading simple software which used to be "second nature". She used to love reading novels, and then a few years ago she found she couldn't remember what she had just read, so she stopped reading for pleasure.   Upon direct questioning, the patient reported the following with regard to current cognitive functioning:   Forgetting recent conversations/events: Yes for details of conversations Misplacing/losing items: yes Forgetting appointments or other obligations: No Forgetting to take medications: No Forgetting to pay bills: Forgot to pay [place] bill which was unusual bc she has lived there 6 years.   Difficulty concentrating: yes Starting but not finishing tasks: yes Distracted easily: Yes. Also goes to another room to get something and can't remember what she went there for Processing information more slowly: yes  Word-finding difficulty: yes. It is very common if her blood sugars are running low, but lately it is happening even when they are not low. 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 She  didn't go into a coma. She was severely dehydrated. She had not been symptomatic aside from feeling really tired. She was treated in the ICU. She took a week off work after coming out of the hospital. She reports her blood  sugars are "still kind of all over the place". For example, this morning her fasting blood glucose was 388. Her blood sugar dropped to the 40s yesterday. She thinks this could be stress related. She has a stressful job, doing the work of essentially 3 different full time positions. She used to enjoy her work but it is very stressful to her now. It takes her longer to do things. She feels constantly behind. She admits she has always set high expectations for herself and has always been achievement oriented.   The only other time she went into DKA was when she was a teenager, and she did go into a coma at that time.  She is compliant with her CPAP but notes that she still wakes up drowsy. She takes Adderall and Nuvigil daily and still feels tired. She has no difficulty falling asleep when she goes to bed. She notes her appetite is reduced. She reports her thyroid function is monitored and levothyroxine dose was increased last month.  With regard to mood, she admits experiencing a lot of anxiety related to her work. She takes 0.25 mg Xanax when she gets to work daily. When asked how she would feel if she didn't take it, she stated, "I would probably have a heart attack." She is very concerned about her cognitive decline. She has a family history of Alzheimer's disease in her paternal aunt and several uncles. This worries her. She has no family history of early onset dementia. She notes that she is on antidepressants but doesn't really know why. She feels her mood is relatively stable, but she does experience sadness when thinking about her medical and cognitive issues. She is tearful and anxious during the session today. She is going to return to seeing her therapist who she has not seen in about a year. She adamantly denies past or present suicidal ideation, noting it is against her religious beliefs. She admits that she cannot remember the last time she felt joyous or particularly happy, although she does not  necessarily feel unhappy a lot of the time. She reports her physical appearance has changed, and she has less interest in or motivation to keep up her appearance, dress up, etc. Which she used to do.  She reports her social life is "non-existent". She talks to her best friend on the phone but hasn't seen her in a few months. Her boyfriend lives in [place] and they see each other about twice a month. Other than at work, she is "not around anyone consistently".  The patient has a condo in [place] and she stays in [place] during the week at a friend's home so that she doesn't have to commute back and forth. She is looking for a new job, given the high stress level of her current position. She manages all instrumental ADLs independently, including driving, medications, appointments, finances and meals. She does notice some difficulty with judging distances when driving (eg hitting curbs) but she is cautious to compensate for this. She has not had any MVAs.   Psychiatric History (per Dr. Maxwell Marion 2016 report): With regards to her mental health history, as noted above she has experienced periodic mild anxiety and depression that has never comprised her ability to function at home or at  work. She reported no history of disabling mood disturbance, mood instability, suicidal behavior or psychotic symptoms. She reported no history of being abused or traumatized. She reported that she was first prescribed antidepressant medication in 11-08-98 shortly after the death of her father. In 2009/11/07, she consulted with a psychiatrist for one visit with a chief complaint of work stress; and she was prescribed bupropion and citalopram. She also began working with a Medical illustrator in 11-08-10 until terminating services after she moved to [place] in 08-Nov-2011.      Social History: Born/Raised: [place] Education: She reported that she has completed the equivalent of three years of college. After graduating from high school,  she matriculated at [employer] on scholarship to study Patent attorney. During her second year, her diabetes became uncontrolled and she was compelled to drop out. 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 She reported that she did not complete her degree as she started working fulltime. She last attended [employer] in 08-Nov-2006 but dropped out due to marital problems. She reported that she had earned excellent grades throughout her college career. She reported no history school-based attentional or learning problems. 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 She has been there about 3 years. 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  Marital history: She was divorced from her ex-husband in 11/07/08 after seven years of marriage. She does not have children. Alcohol: None Tobacco: None, never a smoker   Medical History: Past Medical History:  Diagnosis Date  . Breast cyst    left  . 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 Diabetes mellitus   . Dry eyes, bilateral 11-07-2016  . Ketoacidosis [id number]  . Meningitis 06/2001   --hospital  . Sleep apnea Nov 08, 2011  . 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 Stress   . Thyroid disease    hypothyroidism  . 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 ALPRAZolam (XANAX) 0.5 MG tablet Take 0.25 mg by mouth as needed.   Marland Kitchen amphetamine-dextroamphetamine (ADDERALL XR) 25 MG 24 hr capsule Take 1 capsule by mouth 2 (two) times daily.  Marland Kitchen  amphetamine-dextroamphetamine (ADDERALL) 10 MG tablet Take 1 tablet (10 mg total) by mouth 2 (two) times daily.  . Armodafinil 150 MG tablet TAKE ONE TABLET BY MOUTH DAILY  . BAYER CONTOUR NEXT TEST test strip   . buPROPion (WELLBUTRIN SR) 150 MG 12 hr tablet Take 2 tablets by mouth daily.   Marland Kitchen estrogens, conjugated, (PREMARIN) 0.9 MG tablet Take 1 tablet (0.9 mg total) by mouth daily.  . insulin lispro (HUMALOG) 100 UNIT/ML injection Inject 25 Units into the skin daily.   Marland Kitchen levothyroxine (SYNTHROID, LEVOTHROID) 112 MCG tablet Take 112 mcg by mouth daily.    . TRULANCE 3 MG TABS Take 1 tablet by mouth daily.  . Vitamin D, Ergocalciferol, (DRISDOL) 50000 units CAPS capsule Take 50,000 Units by mouth every 7 (seven) days.   Marland Kitchen XIIDRA 5 % SOLN Apply to eye 2 (two) times daily.   No facility-administered encounter medications on file  as of 10/25/2017.    Levothyroxine: Now taking 137 mcg (not 112 mcg) - as of 10/03/2017  Behavioral Observations:   Appearance: Neatly, casually and appropriately dressed and groomed Gait: Ambulated independently, no gross abnormalities observed Speech: Fluent; normal rate, rhythm and volume. No significant word finding difficulty. Thought process: Linear, goal directed Affect: Full, anxious, tearful Interpersonal: Pleasant, appropriate   60 minutes spent face-to-face with patient completing neurobehavioral status exam. 60 minutes spent integrating medical records/clinical data and completing this report. CPT codes: [id number] unit; [id number] unit.   TESTING: There is medical necessity to proceed with neuropsychological assessment as the results will be used to aid in differential diagnosis and clinical decision-making and to inform specific treatment recommendations. She has had neuropsychological testing in the past which showed deficits with unclear etiology, and per the patient and medical records reviewed, there has been a progression in cognitive decline and a  reasonable suspicion of neurocognitive disorder.  Clinical Decision Making: In considering the patient's current level of functioning, level of presumed impairment, nature of symptoms, emotional and behavioral responses during the interview, level of literacy, and observed level of motivation, a battery of tests was selected and communicated to the psychometrician.    PLAN: The patient will return to complete the above referenced full battery of neuropsychological testing with a psychometrician under my supervision. Education regarding testing procedures was provided to the patient. Subsequently, the patient will see this provider for a follow-up session at which time her test performances and my impressions and treatment recommendations will be reviewed in detail.  Evaluation ongoing; full report to follow.

## 2017-10-26 ENCOUNTER — Other Ambulatory Visit: Payer: Self-pay | Admitting: Neurology

## 2017-10-26 DIAGNOSIS — G4711 Idiopathic hypersomnia with long sleep time: Secondary | ICD-10-CM

## 2017-10-26 DIAGNOSIS — G4733 Obstructive sleep apnea (adult) (pediatric): Secondary | ICD-10-CM

## 2017-10-26 DIAGNOSIS — Z9989 Dependence on other enabling machines and devices: Secondary | ICD-10-CM

## 2017-10-26 MED ORDER — AMPHETAMINE-DEXTROAMPHET ER 25 MG PO CP24: 25.0000 mg | ORAL_CAPSULE | Freq: Two times a day (BID) | ORAL | 0 refills | Status: DC

## 2017-10-26 MED ORDER — AMPHETAMINE-DEXTROAMPHETAMINE 10 MG PO TABS: 10.0000 mg | ORAL_TABLET | Freq: Two times a day (BID) | ORAL | 0 refills | Status: DC

## 2017-10-26 NOTE — Addendum Note (Signed)
Addended by: Huston Foley on: 10/26/2017 04:32 PM   Modules accepted: Orders

## 2017-10-26 NOTE — Telephone Encounter (Signed)
Pt requesting a refill for amphetamine-dextroamphetamine (ADDERALL XR) 25 MG 24 hr capsule and amphetamine-dextroamphetamine (ADDERALL) 10 MG tablet sent to [facility]

## 2017-11-01 ENCOUNTER — Ambulatory Visit: Payer: Managed Care, Other (non HMO) | Admitting: Psychology

## 2017-11-02 ENCOUNTER — Ambulatory Visit (INDEPENDENT_AMBULATORY_CARE_PROVIDER_SITE_OTHER): Payer: Managed Care, Other (non HMO) | Admitting: Psychology

## 2017-11-02 DIAGNOSIS — R413 Other amnesia: Secondary | ICD-10-CM

## 2017-11-02 NOTE — Progress Notes (Signed)
   Neuropsychology Note  [name] [name] completed 150 minutes of neuropsychological testing with technician, Wallace Keller, BS, under the supervision of Dr. Elvis Coil, Licensed Psychologist. The patient did not appear overtly distressed by the testing session, per behavioral observation or via self-report to the technician. Rest breaks were offered.   Clinical Decision Making: In considering the patient's current level of functioning, level of presumed impairment, nature of symptoms, emotional and behavioral responses during the interview, level of literacy, and observed level of motivation/effort, a battery of tests was selected and communicated to the psychometrician.  Communication between the psychologist and technician was ongoing throughout the testing session and changes were made as deemed necessary based on patient performance on testing, technician observations and additional pertinent factors such as those listed above.  [name] [name] will return within approximately 2 weeks for an interactive feedback session with Dr. Alinda Dooms at which time her test performances, clinical impressions and treatment recommendations will be reviewed in detail. The patient understands she can contact our office should she require our assistance before this time.  35 minutes spent performing neuropsychological evaluation services/clinical decision making (psychologist). [CPT 96132] 150 minutes spent face-to-face with patient administering standardized tests, 60 minutes spent scoring (technician). [CPT [id number], 96139]  Full report to follow.

## 2017-11-03 ENCOUNTER — Encounter: Payer: Self-pay | Admitting: Psychology

## 2017-11-15 ENCOUNTER — Encounter: Payer: Self-pay | Admitting: Psychology

## 2017-11-16 ENCOUNTER — Ambulatory Visit: Payer: Managed Care, Other (non HMO) | Admitting: Podiatry

## 2017-11-24 NOTE — Progress Notes (Signed)
NEUROPSYCHOLOGICAL EVALUATION   Name:    [name] [name]  Date of Birth:   [birth year] Date of Interview:  10/25/2017 Date of Testing:  11/02/2017   Date of Feedback:  11/28/2017       Background Information:  Reason for Referral:  [name] [name] is a 52 y.o. female referred by Dr. Huston Foley of [facility] to assess her current level of cognitive functioning and assist in differential diagnosis. The current evaluation consisted of a review of available medical records, an interview with the patient, and the completion of a neuropsychological testing battery. Informed consent was obtained.  History of Presenting Problem:  [name] [name] has a history of Type 1 DM, OSA on CPAP, excessive daytime fatigue (on Adderall and Nuvigal), and hypothyroidism. She is followed by Dr. Frances Furbish and reported onset of cognitive difficulties in 2014. Brain MRI in 12/2013 was normal. She reported that for the first time in her life she was given verbal warnings at work in September 2015 by her supervisor regarding below expected job performance. She was evaluated by Dr. Leonides Cave in 07/2014. She was medicated with usual dose of Adderall and Nuvigil for this exam. Her neuropsychological test profile was abnormal with impairments in memory storage and executive functioning. Etiology was unclear. There was no evidence of primary psychiatric disorder. Underlying neurodegenerative condition could not be ruled out. Neuropsychological re-evaluation the future was recommended. EEG completed in 08/2014 was normal. She was terminated from her job. In hindsight, the patient states that this may have been related to interpersonal issues with her supervisor rather than shortcomings on her own part. The patient states that being let go from that job was actually "the best thing that could have happened". She got a new job in [place] and has been working there full time for the last 3 years. 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 She reports problems with "confusion and not remembering". She reports more recently she has been forgetting to do very simple, routine tasks like zipping her pants (this happened twice this past week). She can no longer perform simple mathematical calculations in her head. 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 She used to be able to change the programming on her insulin pump and can no longer do that either. She has to have a pump specialist help. She bought a new laptop last week and is having trouble downloading simple software which used to be "second nature". She used to love reading novels, and then a few years ago she found she couldn't remember what she had just read, so she stopped reading for pleasure.   Upon direct questioning, the patient reported the following with regard to current cognitive functioning:   Forgetting recent conversations/events: Yes for details of conversations Misplacing/losing items: yes Forgetting appointments or other obligations: No Forgetting to take medications: No Forgetting to pay bills: Forgot to pay [facility] bill which was unusual bc she has lived there 6 years.   Difficulty concentrating: yes Starting but not finishing tasks: yes Distracted easily: Yes. Also goes to another room to get something and can't remember what she went there for Processing information more slowly: yes  Word-finding difficulty: yes. It is very common if her blood sugars are running low, but lately  it is happening even when they are not low. Comprehension difficulty: No  Getting lost when driving: "I use google maps for everything" Uncertain about directions when driving or passenger: Yes, above   Of note, she was hospitalized in March 2019  for DKA. She didn't go into a coma. She was severely dehydrated. She had not been symptomatic aside from feeling really tired. She was treated in the ICU. She took a week off work after coming out of the hospital. She reports her blood sugars are "still kind of all over the place". For example, this morning her fasting blood glucose was 388. Her blood sugar dropped to the 40s yesterday. She thinks this could be stress related. She has a stressful job, doing the work of essentially 3 different full time positions. She used to enjoy her work but it is very stressful to her now. It takes her longer to do things. She feels constantly behind. She admits she has always set high expectations for herself and has always been achievement oriented.   The only other time she went into DKA was when she was a teenager, and she did go into a coma at that time.  She is compliant with her CPAP but notes that she still wakes up drowsy. She takes Adderall and Nuvigil daily and still feels tired. She has no difficulty falling asleep when she goes to bed. She notes her appetite is reduced. She reports her thyroid function is monitored and levothyroxine dose was increased last month.  With regard to mood, she admits experiencing a lot of anxiety related to her work. She takes 0.25 mg Xanax when she gets to work daily. When asked how she would feel if she didn't take it, she stated, "I would probably have a heart attack." She is very concerned about her cognitive decline. She has a family history of Alzheimer's disease in her paternal aunt and several uncles. This worries her. She has no family history of early onset dementia. She notes that she is on antidepressants but doesn't really know why. She feels her mood is relatively stable, but she does experience sadness when thinking about her medical and cognitive issues. She is tearful and anxious during the session today. She is going to return to seeing her therapist who she has  not seen in about a year. She adamantly denies past or present suicidal ideation, noting it is against her religious beliefs. She admits that she cannot remember the last time she felt joyous or particularly happy, although she does not necessarily feel unhappy a lot of the time. She reports her physical appearance has changed, and she has less interest in or motivation to keep up her appearance, dress up, etc. Which she used to do.  She reports her social life is "non-existent". She talks to her best friend on the phone but hasn't seen her in a few months. Her boyfriend lives in [place] and they see each other about twice a month. Other than at work, she is "not around anyone consistently".  The patient has a condo in [place] and she stays in [place] during the week at a friend's home so that she doesn't have to commute back and forth. She is looking for a new job, given the high stress level of her current position. She manages all instrumental ADLs independently, including driving, medications, appointments, finances and meals. She does notice some difficulty with judging distances when driving (eg hitting curbs) but she is cautious to compensate for this.  She has not had any MVAs.   Psychiatric History (per Dr. Maxwell Marion 11-11-2014 report): With regards to her mental health history, as noted above she has experienced periodic mild anxiety and depression that has never comprised her ability to function at home or at work. She reported no history of disabling mood disturbance, mood instability, suicidal behavior or psychotic symptoms. She reported no history of being abused or traumatized. She reported that she was first prescribed antidepressant medication in 1998-11-11 shortly after the death of her father. In Nov 10, 2009, she consulted with a psychiatrist for one visit with a chief complaint of work stress; and she was prescribed bupropion and citalopram. She also began working with a Medical illustrator in 11-Nov-2010  until terminating services after she moved to [place] in 11/11/11.    Social History: Born/Raised: [place] Education: She reported that she has completed the equivalent of three years of college. After graduating from high school, she matriculated at [employer] on scholarship to study Patent attorney. During her second year, her diabetes became uncontrolled and she was compelled to drop out. 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 She reported that she did not complete her degree as she started working fulltime. She last attended [employer] in Nov 11, 2006 but dropped out due to marital problems. She reported that she had earned excellent grades throughout her college career. She reported no history school-based attentional or learning problems. 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 She has been there about 3 years. 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  Marital history: She was divorced from her ex-husband in Nov 10, 2008 after seven years of marriage. She does not have children. Alcohol: None Tobacco: None, never a smoker  Medical History:  Past Medical History:  Diagnosis Date  . Breast cyst    left  . 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 Diabetes mellitus   . Dry eyes, bilateral 11-10-16  . Ketoacidosis [id number]  . Meningitis 06/2001   --hospital  . Sleep apnea 11/11/2011  . 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 Stress   . Thyroid disease    hypothyroidism  . 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 ALPRAZolam (XANAX) 0.5 MG tablet Take 0.25 mg by mouth as needed.   Marland Kitchen amphetamine-dextroamphetamine (ADDERALL XR) 25 MG 24 hr capsule Take 1 capsule by mouth 2 (two) times daily.  Marland Kitchen amphetamine-dextroamphetamine (ADDERALL) 10 MG tablet Take 1 tablet (10 mg total) by mouth 2 (two) times daily.  . Armodafinil 150 MG tablet TAKE ONE TABLET BY MOUTH DAILY  . BAYER CONTOUR NEXT TEST test strip   . buPROPion (WELLBUTRIN SR) 150 MG 12 hr tablet Take 2 tablets by mouth daily.   Marland Kitchen estrogens, conjugated, (PREMARIN) 0.9 MG tablet Take 1 tablet (0.9 mg total) by mouth daily.  . insulin lispro (HUMALOG) 100 UNIT/ML injection Inject 25 Units into the skin daily.   Marland Kitchen levothyroxine (SYNTHROID, LEVOTHROID) 112 MCG tablet Take 112 mcg by mouth daily.    Marland Kitchen levothyroxine (SYNTHROID, LEVOTHROID) 137 MCG tablet Take 137 mcg  by mouth daily.  . TRULANCE 3 MG TABS Take 1 tablet by mouth daily.  . Vitamin D, Ergocalciferol, (DRISDOL) 50000 units CAPS capsule Take 50,000 Units by mouth every 7 (seven) days.   Marland Kitchen XIIDRA 5 % SOLN Apply to eye 2 (two) times daily.   No facility-administered encounter medications on file as of 11/28/2017.    Levothyroxine: Now taking 137 mcg (not 112 mcg) - as of 10/03/2017   Current Examination:  Behavioral Observations:  Appearance: Neatly, casually and appropriately dressed and groomed Gait: Ambulated independently, no gross abnormalities observed Speech: Fluent; normal rate, rhythm and volume. No significant word finding difficulty. Thought process: Linear, goal directed Affect: Full, anxious, tearful Interpersonal: Pleasant, appropriate Orientation: Oriented to all spheres. Accurately named the current President and his predecessor.   Tests Administered: . Test of Premorbid Functioning (TOPF) . Wechsler Adult Intelligence Scale-Fourth Edition (WAIS-IV): Similarities, Information, Block Design, Matrix Reasoning, Arithmetic, Symbol  Search, Coding and Digit Span subtests . Wechsler Memory Scale-Fourth Edition (WMS-IV) Adult Version (ages 5-69): Logical Memory I, II and Recognition subtests  . [place] Verbal Learning Test - 2nd Edition (CVLT-2) Alternate Form . Rey Complex Figure Test (RCFT) . [employer] (WCST) . Controlled Oral Word Association Test (COWAT) . Trail Making Test A and B . [employer] (BNT) . Beck Depression Inventory - Second edition (BDI-II) . Personality Assessment Inventory (PAI)  Test Results: Note: Standardized scores are presented only for use by appropriately trained professionals and to allow for any future test-retest comparison. These scores should not be interpreted without consideration of all the information that is contained in the rest of the report. The most recent standardization samples from the test publisher or other sources were used whenever possible to derive standard scores; scores were corrected for age, gender, ethnicity and education when available.   Test Scores:  Test Name Raw Score Standardized Score Descriptor  TOPF 53/70 SS= 110 High average  WAIS-IV Subtests     Similarities 29/36 ss= 11 Average  Information 10/26 ss= 8 Average  Block Design 38/66 ss= 10 Average  Matrix Reasoning 23/26 ss= 15 Superior  Arithmetic 17/22 ss= 12 High average  Symbol Search 27/60 ss= 9 Average  Coding 99/135 ss= 17 Very superior  Digit Span 32/48 ss= 12 High average  WAIS-IV Index Scores     Verbal Comprehension  SS= 98 Average  Perceptual Reasoning  SS= 115 High average  Working Memory  SS= 111 High average  Processing Speed  SS= 117 High average  Full Scale IQ (8 subtest)  SS= 112 High average  WMS-IV Subtests     LM I 35/50 ss= 14 Superior  LM II 34/50 ss= 15 Superior  LM II Recognition 30/30 Cum %: >75 Above average  CVLT-II Scores     Trial 1 8/16 Z= 1 High average  Trial 5 15/16 Z= 1 High average  Trials 1-5 total 67/80 T= 69 Superior  SD Free  Recall 13/16 Z= 0.5 Average  SD Cued Recall 15/16 Z= 1 High average  LD Free Recall 13/16 Z= 0 Average  LD Cued Recall 14/16 Z= 0.5 Average  Recognition Discriminability 16/16 hits; 0 false positives Z= 1.5 Superior  Forced Choice Recognition 16/16  WNL  RCFT     Copy 25/36 <1%ile Impaired  3' Recall 13.5/36 T= 38 Low average  30' Recall 14.5/36 T= 40 Low average  Recognition 19/24 T= 41 Low average  WCST     Total Errors 19 T= 44 Average  Perseverative Responses 7 T= 51 Average  Perseverative Errors 7 T= 50 Average  Conceptual Level Responses 42 T= 43 Average  Categories Completed 3 >16% WNL  Trials to Complete 1st Category 12 >16% WNL  Failure to Maintain Set 2    BNT 53/60 T= 46 Average  COWAT-FAS 41 T= 47 Average  COWAT-Animals 28 T= 61 High average  Trail Making Test A  34" 0 errors T= 51 Average  Trail Making Test B  51" 0 errors T= 61 High average  BDI-II 39/63  Severe  PAI (Only elevated clinical scales are shown here)     ANX  T= 84   DEP  T= 91   SCZ  T= 77      Description of Test Results:  Premorbid verbal intellectual abilities were estimated to have been within the high average range based on a test of word reading. Current full scale IQ fell within the high average range (significantly improved from 2016).  Psychomotor processing speed was highly variable. On one task, she performed in the very superior range (compared to average in 2016), and on another task she performed in the average range (consistent with 2016). On Trails A, she performed within the average range, significantly improved from the 1st percentile in 2016.    Auditory attention and working memory were high average, improved from low average/average in 2016.   Visual-spatial construction was variable. Her ability to manipulate three dimensional blocks to match two dimensional models was average (improved from 2016). Meanwhile, her drawn copy of a complex geometric figure was severely  impaired. She also had difficulty with this in 2016.   Language/semantic retrieval abilities were stable. Confrontation naming was average and consistent with 2016. Semantic verbal fluency (animal naming) was high average and consistent with 2016.   With regard to verbal memory, encoding and acquisition of non-contextual information (i.e., word list) was superior across five learning trials. After a brief interference task, free recall was average (13/16 items). With semantic cueing, recall improved to high average (15/16 items). After a delay, free recall was average (14/16 items). Cued recall was average (14/16 items). Performance on a yes/no recognition task was superior with 100% accuracy. On another verbal memory test, encoding and acquisition of contextual auditory information (i.e., short stories) was superior. After a delay, free recall was superior. Performance on a yes/no recognition task was superior with 100% accuracy. These performances on verbal memory tests were significantly improved relative to performances in 2016.   With regard to non-verbal memory, delayed free recall of visual information was low average after both a 3 minute and a 30 minute delay. Performance on a yes/no recognition task was low average as well. These performances may be at least partially attributable to poor initial rendering of the visual information (as her copy score was severely impaired). In 2016, she was administered a different test of non-verbal memory, and while her performance was below expectation.   Executive functioning was within normal limits overall. Mental flexibility and set-shifting were high average on Trails B and significantly improved relative to 2016. Verbal fluency with phonemic search restrictions was average, consistent with 2016. Verbal abstract reasoning was average, consistent with 2016. Non-verbal abstract reasoning was superior, significantly improved from 2016 when she performed in the  impaired range on the same task. Deductive reasoning and problem solving were within normal limits but slightly reduced from 2016.   On a self-report measure of mood, the patient's responses were indicative of severe, clinically significant depression  at the present time. Symptoms endorsed included: sadness much of the time, anhedonia, pessimism, feelings of failure, disappointment in self, self-criticalness, tearfulness, restlessness, loss of interest, indecisiveness, worthlessness, loss of energy, reduced sleep, irritability, lack of appetite, concentration difficulty, fatigue and reduced libido. She denied suicidal ideation or intention.   She was also administered a more thorough screening measure of psychopathology and personality function (PAI). Symptom validity indicators were normal, suggesting that the patient answered in a reasonably forthright manner and did not attempt to present an unrealistic or inaccurate impression that was either more negative or more positive than the clinical picture would warrant. The PAI clinical profile is marked by significant elevations across a number of different scales, indicating a broad range of clinical features and increasing the possibility of multiple diagnoses.  The configuration of the clinical scales suggests a person with significant unhappiness, moodiness, and tension.  Although the patient is quite distressed and acutely aware of her need for help, her low energy level, passivity, and withdrawal may make her difficult to engage in treatment.  Her self-esteem is quite low and she views herself as ineffectual and powerless to change the direction of her life.  The disruptions in her life have left her uncertain about her goals and priorities, and tense and pessimistic about what the future may hold.  She reports difficulties concentrating and making decisions, and the combination of hopelessness, agitation, confusion, and stress apparent in these scores may  place the patient at increased risk for self-harm. The patient reports a number of difficulties consistent with a significant depressive experience.  She is likely to be plagued by thoughts of worthlessness, hopelessness, and personal failure.  She admits openly to feelings of sadness, a loss of interest in normal activities, and a loss of sense of pleasure in things that were previously enjoyed.  She is likely to show a disturbance in sleep pattern, a decrease in level of energy and sexual interest, and a loss of appetite and/or weight.  Psychomotor slowing might also be expected. The patient indicates that she is experiencing a discomforting level of anxiety and tension.  She is likely to be plagued by worry to the degree that her ability to concentrate and attend are significantly compromised.  Affectively, she feels a great deal of tension, has difficulty relaxing, and likely experiences fatigue as a result of high perceived stress.  Overt physical signs of tension and stress, such as sweaty palms, trembling hands, complaints of irregular heartbeats, and shortness of breath are also present. A number of aspects of the patient's self-description suggest noteworthy peculiarities in thinking and experience.  She is likely to be a socially isolated individual who has few interpersonal relationships that could be described as close and warm.  She may have limited social skills, with particular difficulty interpreting the normal nuances of interpersonal behavior that provide the meaning to personal relationships.  Her social isolation and detachment may serve to decrease a sense of discomfort that interpersonal contact fosters.  Her thought processes are likely to be marked by confusion, distractibility, and difficulty concentrating, and she may experience her thoughts as being somehow blocked or disrupted.  However, active psychotic symptoms such as hallucinations or delusions do not appear to be a prominent part  of the clinical picture at this time. The patient indicates some concerns about physical functioning and health matters in general.   The patient indicates that she occasionally experiences, or may experience to a mild degree, maladaptive behavior patterns aimed at controlling  anxiety.  Phobic behaviors are likely to interfere in some significant way in her life, and it is probable that she monitors her environment in an effort to avoid contact with the feared object or situation.  She is more likely to have multiple phobias or a more distressing phobia, such as agoraphobia, than to suffer from a simple phobia. According to the patient's self-report, she describes NO significant problems in the following areas: antisocial behavior; problems with empathy; undue suspiciousness or hostility; extreme moodiness and impulsivity; unusually elevated mood or heightened activity.  Also, she reports NO significant problems with alcohol or drug abuse or dependence.  With respect to suicidal ideation, the patient is NOT reporting distress from thoughts of self-harm.   Clinical Impressions: Major depressive disorder, severe. Anxiety disorder, unspecified. Cognitive test performances are largely within normal limits and overall much improved from 2016. Results of cognitive testing were, overall, within normal limits. (There was only one single test performance which was impaired, but her performances on other tests in the same domain of functioning were intact.) In fact, many performances were high average to superior range. Additionally, many performances were significantly improved relative to 3 years ago. This is reassuring and rules out underlying neurodegenerative disorder.  Meanwhile, she is experiencing clinically significant levels of depression and anxiety, along with significant work stress. I think it is most likely that her cognitive difficulties in daily life are secondary to depression, anxiety and stress.  Sleep difficulty/fatigue is also likely a contributing factor. I am concerned about the level of depression and anxiety she is experiencing, as I think it is also likely having negative impact on her physical health and medical status (eg contributing to difficulty controlling her DM which has required two recent hospitalizations). She is not suicidal or at acute risk for any kind of self harm, but I do feel her depression and anxiety need to be more aggressively treated and that she would benefit from a leave of absence from work. We discussed this at length.   Recommendations/Plan: Based on the findings of the present evaluation, the following recommendations are offered:  1. It would be ideal if she could see her therapist on a weekly basis for a while, given the very high level of depression and anxiety. She is going to contact her therapist, [name] [name], to see if this is a possibility. She would also like me to share this report with her therapist, which I think it is a great idea for continuity of care.  2. Psychiatry consultation may be useful in the future to determine if there is a need for change in antidepressant. We spoke about the eventual goal of reducing her reliance on Xanax which she agrees with.  3. She was reassured regarding the significant improvement on cognitive testing from her last evaluation, and she was relieved there is no sign of neurodegenerative dementia. 4. Obviously optimal control of diabetes is very important for brain health. At this time, lifestyle changes to reduce stress and improve mood/anxiety will likely have the most impact on medical status.   Feedback to Patient: [name] [name] returned for a feedback appointment on 11/28/2017 to review the results of her neuropsychological evaluation with this provider. 60 minutes face-to-face time was spent reviewing her test results, my impressions and my recommendations as detailed above.    Total time spent on this  patient's case: 120 minutes for neurobehavioral status exam with psychologist (CPT code 16109, [phone] unit); 210 minutes of testing/scoring by psychometrician under psychologist's  supervision (CPT codes 40981, [phone] units); 230 minutes for integration of patient data, interpretation of standardized test results and clinical data, clinical decision making, treatment planning and preparation of this report, and interactive feedback with review of results to the patient/family by psychologist (CPT codes 95621, [phone] units).      Thank you for your referral of [name] [name]. Please feel free to contact me if you have any questions or concerns regarding this report.

## 2017-11-28 ENCOUNTER — Encounter: Payer: Self-pay | Admitting: Podiatry

## 2017-11-28 ENCOUNTER — Encounter: Payer: Self-pay | Admitting: Psychology

## 2017-11-28 ENCOUNTER — Ambulatory Visit: Payer: Managed Care, Other (non HMO) | Admitting: Psychology

## 2017-11-28 ENCOUNTER — Ambulatory Visit: Payer: Managed Care, Other (non HMO) | Admitting: Podiatry

## 2017-11-28 VITALS — BP 125/72 | HR 101 | Resp 16

## 2017-11-28 DIAGNOSIS — R42 Dizziness and giddiness: Secondary | ICD-10-CM | POA: Insufficient documentation

## 2017-11-28 DIAGNOSIS — B351 Tinea unguium: Secondary | ICD-10-CM | POA: Diagnosis not present

## 2017-11-28 DIAGNOSIS — R413 Other amnesia: Secondary | ICD-10-CM | POA: Diagnosis not present

## 2017-11-28 DIAGNOSIS — R410 Disorientation, unspecified: Secondary | ICD-10-CM | POA: Insufficient documentation

## 2017-11-28 DIAGNOSIS — F332 Major depressive disorder, recurrent severe without psychotic features: Secondary | ICD-10-CM

## 2017-11-28 DIAGNOSIS — F419 Anxiety disorder, unspecified: Secondary | ICD-10-CM

## 2017-11-28 NOTE — Patient Instructions (Signed)
Cognitive test performances were overall within the normal range, with many aspects of functioning well above average for age. This represented significant improvement in performances compared to 3 years ago. This is very reassuring because if there was a neurodegenerative condition or dementia present, we would see worsening, not improvement, in performances over time.  Meanwhile there is evidence of significant depression, at a level we would consider severe and in need of more aggressive treatment. There is also evidence of significant anxiety and psychosocial stress. These factors, in combination with sleep disturbance, or likely affecting cognitive functioning to a large degree in daily life.   As such, my recommendation is to focus on mental health intervention. I recommend weekly therapy sessions. I also recommend psychiatry consultation to determine any need for change in antidepressant medication. Finally, I recommend taking steps to care for yourself, such as considering some time off work, engaging in self care activities, engaging in relaxation activities, getting regular physical exercise, and scheduling pleasurable activities.

## 2017-12-01 NOTE — Progress Notes (Signed)
   Subjective: 52 year old female presenting today as a new patient with a chief complaint of discolored, thickened toenails of bilateral feet that appeared several months ago. There are no modifying factors noted and she denies pain. She has not done anything for treatment. Patient is here for further evaluation and treatment.   Past Medical History:  Diagnosis Date  . Breast cyst    left  . 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 Diabetes mellitus   . Dry eyes, bilateral 2018  . Ketoacidosis 1610,[id number],1994  . Meningitis 06/2001   --hospital  . Sleep apnea 2013  . 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 Stress   . Thyroid disease    hypothyroidism  . VAIN I (vaginal intraepithelial neoplasia grade I) 2013    Objective: Physical Exam General: The patient is alert and oriented x3 in no acute distress.  Dermatology: Hyperkeratotic, discolored, thickened, onychodystrophy of nails noted bilaterally. Skin is warm, dry and supple bilateral lower extremities. Negative for open lesions or macerations.  Vascular: Palpable pedal pulses bilaterally. No edema or erythema noted. Capillary refill within normal limits.  Neurological: Epicritic and protective threshold grossly intact bilaterally.   Musculoskeletal Exam: Range of motion within normal limits to all pedal and ankle joints bilateral. Muscle strength 5/5 in all groups bilateral.   Assessment: #1 onychomycosis nails 1-5 bilateral  Plan of Care:  #1 Patient was evaluated. #2 Recommended Formula 3 topical nail lacquer.  #3 recommended good foot hygiene. #4 Return to clinic as needed    Felecia ShellingBrent M. Burlene Montecalvo, DPM [facility]  Dr. Felecia ShellingBrent M. Dovid Bartko, DPM    [address]                                        [place], [place] [address]                Office [phone]  Fax [phone]

## 2017-12-06 ENCOUNTER — Ambulatory Visit (INDEPENDENT_AMBULATORY_CARE_PROVIDER_SITE_OTHER): Payer: Managed Care, Other (non HMO) | Admitting: Psychology

## 2017-12-06 ENCOUNTER — Telehealth: Payer: Self-pay | Admitting: Psychology

## 2017-12-06 DIAGNOSIS — F330 Major depressive disorder, recurrent, mild: Secondary | ICD-10-CM | POA: Diagnosis not present

## 2017-12-06 NOTE — Telephone Encounter (Signed)
Hi Dr. Alinda DoomsBailar  This patient left a message on the machine regarding having seen Dr. Colen DarlingLisa Flores. She was needing to see if you could fax her results to her. She also said she is needing a referral to another physician but I could not make out the name. They are also needing those results as well as any background information faxed to [phone]. Please Advise.   Thanks [facility]

## 2017-12-08 NOTE — Telephone Encounter (Signed)
Thank you for the message. I did send the report to Colen DarlingLisa Flores, the patient's therapist, the day it was completed. Can you please get the fax number she would like it faxed to, and we can send it again?  She will need to sign a release in order for us to send records to anyone else.  Thanks!

## 2017-12-08 NOTE — Telephone Encounter (Signed)
Perfect, thank you. I have printed out her previous after visit summary and other papers I gave her, and put them at the front desk for her to pick up.

## 2017-12-08 NOTE — Telephone Encounter (Signed)
Sounds Good! 

## 2017-12-08 NOTE — Telephone Encounter (Signed)
I called the patient and told her we needed a Medical Release Form filled out with that Providers information. She will be by today to fill that out. She also requested to pick up the papers that were given to her at her last visit, she cannot find them.   Thanks [facility]

## 2017-12-12 NOTE — Progress Notes (Signed)
Patient requested referral be sent to Dr. Ardeth SportsmanAlex Eksir at [facility]. I submitted this referral today and forwarded a copy of her recent neuropsychological evaluation to him as well (per patient's written consent).

## 2017-12-12 NOTE — Addendum Note (Signed)
Addended by: Othelia PullingBAILAR-HEATH, MARY B on: 12/12/2017 08:42 AM   Modules accepted: Orders

## 2017-12-13 ENCOUNTER — Ambulatory Visit: Payer: Managed Care, Other (non HMO) | Admitting: Psychology

## 2017-12-19 ENCOUNTER — Encounter: Payer: Self-pay | Admitting: Psychology

## 2017-12-19 ENCOUNTER — Telehealth: Payer: Self-pay | Admitting: Psychology

## 2017-12-19 NOTE — Telephone Encounter (Signed)
Spoke with patient. She is on medical leave through June 10 but her psychiatry appointment to discuss changing meds is not until July 16, and her therapist has only been able to see her once since she went on medical leave. She is very anxious about returning to work before psychiatry consultation, and I agree that it would be best if she could have a chance to implement medication changes and increase therapy before attempting to return to work. I will write letter to this effect for her employer and place it at the front desk for her to pick up later today.

## 2017-12-21 ENCOUNTER — Other Ambulatory Visit: Payer: Self-pay

## 2017-12-21 DIAGNOSIS — Z9989 Dependence on other enabling machines and devices: Secondary | ICD-10-CM

## 2017-12-21 DIAGNOSIS — G4711 Idiopathic hypersomnia with long sleep time: Secondary | ICD-10-CM

## 2017-12-21 DIAGNOSIS — G4733 Obstructive sleep apnea (adult) (pediatric): Secondary | ICD-10-CM

## 2017-12-21 MED ORDER — AMPHETAMINE-DEXTROAMPHET ER 25 MG PO CP24: 25.0000 mg | ORAL_CAPSULE | Freq: Two times a day (BID) | ORAL | 0 refills | Status: DC

## 2017-12-21 MED ORDER — AMPHETAMINE-DEXTROAMPHETAMINE 10 MG PO TABS: 10.0000 mg | ORAL_TABLET | Freq: Two times a day (BID) | ORAL | 0 refills | Status: DC

## 2017-12-21 NOTE — Telephone Encounter (Signed)
[facility] requesting refills on pt's adderall. Pt is due for a refill on both strengths of adderall.    Drug Registry Checked.  [facility] in Dr. Teofilo PodAthar's absence.

## 2017-12-30 ENCOUNTER — Ambulatory Visit (INDEPENDENT_AMBULATORY_CARE_PROVIDER_SITE_OTHER): Payer: 59 | Admitting: Psychology

## 2017-12-30 DIAGNOSIS — F331 Major depressive disorder, recurrent, moderate: Secondary | ICD-10-CM

## 2018-01-03 ENCOUNTER — Telehealth: Payer: Self-pay | Admitting: Psychology

## 2018-01-03 NOTE — Progress Notes (Signed)
Patient dropped off FMLA paperwork for me to complete. I have completed this and submitted a copy for scanning, and her copy is ready to be picked up at the front desk.

## 2018-01-09 ENCOUNTER — Telehealth: Payer: Self-pay | Admitting: Psychology

## 2018-01-09 NOTE — Telephone Encounter (Signed)
Patient left message on the VM that her appt with Dr Rene KocherEksir was resch to 03-02-18 and she wants to know if we can send her somewhere that can get her in this month

## 2018-01-10 ENCOUNTER — Ambulatory Visit (HOSPITAL_COMMUNITY): Payer: Self-pay | Admitting: Psychiatry

## 2018-01-10 NOTE — Telephone Encounter (Signed)
Can you (or one of the staff) please contact patient and let her know I will be out of the office the rest of this week, and unfortunately I don't know any psychiatry practice locally that can get her in this month.  I do know of a psychiatry practice in [place] that is good, but I don't know how long their wait is. If she wants to contact them to see, the practice is [facility] (Dr. Laurey ArrowAllison Mikel), and the phone number is [phone].  Thank you!

## 2018-01-11 NOTE — Telephone Encounter (Signed)
I called patient and let her know the telephone number to the practice in [place] and that Dr Alinda DoomsBailar will be out of the office the rest of the week.

## 2018-01-15 NOTE — Telephone Encounter (Signed)
Thank you, Dana.

## 2018-01-16 ENCOUNTER — Ambulatory Visit (INDEPENDENT_AMBULATORY_CARE_PROVIDER_SITE_OTHER): Payer: 59 | Admitting: Psychology

## 2018-01-16 DIAGNOSIS — F331 Major depressive disorder, recurrent, moderate: Secondary | ICD-10-CM

## 2018-01-18 ENCOUNTER — Telehealth: Payer: Self-pay | Admitting: Neurology

## 2018-01-18 NOTE — Telephone Encounter (Signed)
Patient called needing to have a letter written from Dr. Karel JarvisAquino stating when she can return back to work. Thanks

## 2018-01-18 NOTE — Telephone Encounter (Signed)
Looks like Dr. Alinda DoomsBailar had written a letter for her to be on leave until July 31?

## 2018-01-19 NOTE — Telephone Encounter (Signed)
Patient is ok with letter Dr Delaine LameBailor wrote.

## 2018-02-06 ENCOUNTER — Other Ambulatory Visit: Payer: Self-pay

## 2018-02-06 DIAGNOSIS — G4733 Obstructive sleep apnea (adult) (pediatric): Secondary | ICD-10-CM

## 2018-02-06 DIAGNOSIS — G4711 Idiopathic hypersomnia with long sleep time: Secondary | ICD-10-CM

## 2018-02-06 DIAGNOSIS — Z9989 Dependence on other enabling machines and devices: Secondary | ICD-10-CM

## 2018-02-06 MED ORDER — AMPHETAMINE-DEXTROAMPHET ER 25 MG PO CP24: 25.0000 mg | ORAL_CAPSULE | Freq: Two times a day (BID) | ORAL | 0 refills | Status: DC

## 2018-02-06 MED ORDER — AMPHETAMINE-DEXTROAMPHETAMINE 10 MG PO TABS: 10.0000 mg | ORAL_TABLET | Freq: Two times a day (BID) | ORAL | 0 refills | Status: DC

## 2018-02-06 NOTE — Telephone Encounter (Signed)
Pt is due for a refill on her adderall RXs. [place] Drug Registry checked.

## 2018-02-08 ENCOUNTER — Telehealth: Payer: Self-pay | Admitting: Neurology

## 2018-02-08 NOTE — Telephone Encounter (Signed)
error 

## 2018-02-13 ENCOUNTER — Encounter: Payer: Self-pay | Admitting: Neurology

## 2018-02-13 ENCOUNTER — Ambulatory Visit (INDEPENDENT_AMBULATORY_CARE_PROVIDER_SITE_OTHER): Payer: Managed Care, Other (non HMO) | Admitting: Neurology

## 2018-02-13 VITALS — BP 130/72 | HR 100 | Ht 64.0 in | Wt 134.0 lb

## 2018-02-13 DIAGNOSIS — G4711 Idiopathic hypersomnia with long sleep time: Secondary | ICD-10-CM

## 2018-02-13 DIAGNOSIS — G4733 Obstructive sleep apnea (adult) (pediatric): Secondary | ICD-10-CM | POA: Diagnosis not present

## 2018-02-13 DIAGNOSIS — Z9989 Dependence on other enabling machines and devices: Secondary | ICD-10-CM | POA: Diagnosis not present

## 2018-02-13 NOTE — Progress Notes (Signed)
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 time to time. From my end of things, she is advised to follow-up routinely in 6 months. I answered all her questions today and she was in agreement. I spent 25 minutes in total face-to-face time with the patient, more than 50% of which was spent in counseling and coordination of care, reviewing test results, reviewing medication and discussing or reviewing the diagnosis of hypersomnolence, sleep apnea, the, prognosis and treatment options. Pertinent laboratory and imaging test results that were available during this visit with the patient were reviewed by me and considered in my medical decision making (see chart for details).

## 2018-02-13 NOTE — Patient Instructions (Addendum)
Please continue to use your CPAP more regularly. You have had more erratic usage, some decline in the hours of usage, could very well be related to flare up of your anxiety.

## 2018-02-20 ENCOUNTER — Telehealth: Payer: Self-pay | Admitting: Neurology

## 2018-02-20 NOTE — Telephone Encounter (Signed)
Patient calling. Dr. Frances FurbishAthar wanted her to adjust rap up time on her CPAP machine but she does not have that option. Please call and discuss.

## 2018-02-20 NOTE — Telephone Encounter (Signed)
I called pt. She reports that she tried to adjust the ramp time on her cpap but it was grayed out. She has not asked [facility] for help. I advised her that her cpap machine may be too old for that function, although she reports that her machine is 162-52 years old. I will ask [facility] to assist her with this. Pt verbalized understanding.  I have reached out to [facility] on pt's behalf and asked them to call her to discuss. Pt verbalized understanding.

## 2018-03-02 ENCOUNTER — Ambulatory Visit (HOSPITAL_COMMUNITY): Payer: Self-pay | Admitting: Psychiatry

## 2018-03-02 ENCOUNTER — Encounter

## 2018-03-17 ENCOUNTER — Other Ambulatory Visit: Payer: Self-pay | Admitting: Neurology

## 2018-03-17 DIAGNOSIS — Z9989 Dependence on other enabling machines and devices: Secondary | ICD-10-CM

## 2018-03-17 DIAGNOSIS — G4733 Obstructive sleep apnea (adult) (pediatric): Secondary | ICD-10-CM

## 2018-03-17 DIAGNOSIS — G4711 Idiopathic hypersomnia with long sleep time: Secondary | ICD-10-CM

## 2018-03-17 MED ORDER — AMPHETAMINE-DEXTROAMPHET ER 25 MG PO CP24: 25.0000 mg | ORAL_CAPSULE | Freq: Two times a day (BID) | ORAL | 0 refills | Status: DC

## 2018-03-17 MED ORDER — AMPHETAMINE-DEXTROAMPHETAMINE 10 MG PO TABS: 10.0000 mg | ORAL_TABLET | Freq: Two times a day (BID) | ORAL | 0 refills | Status: DC

## 2018-03-17 NOTE — Telephone Encounter (Signed)
Pt request refill for amphetamine-dextroamphetamine (ADDERALL XR) 25 MG 24 hr capsule and  amphetamine-dextroamphetamine (ADDERALL) 10 MG tablet sent to [facility] [address] - [place], [place] - [id number] [address]

## 2018-04-14 ENCOUNTER — Other Ambulatory Visit: Payer: Self-pay

## 2018-04-14 MED ORDER — ESTROGENS CONJUGATED 0.9 MG PO TABS: 0.9000 mg | ORAL_TABLET | Freq: Every day | ORAL | 0 refills | Status: DC

## 2018-04-14 NOTE — Telephone Encounter (Signed)
Medication refill request: Premarin 0.09 mg tab  Last AEX:  04/15/17 Next AEX: 04/28/18 Last MMG (if hormonal medication request):04/15/17 Bi-rads 1 neg    Refill authorized: #30 with 0 RF

## 2018-04-21 ENCOUNTER — Other Ambulatory Visit: Payer: Self-pay | Admitting: Neurology

## 2018-04-21 DIAGNOSIS — G4733 Obstructive sleep apnea (adult) (pediatric): Secondary | ICD-10-CM

## 2018-04-21 DIAGNOSIS — Z9989 Dependence on other enabling machines and devices: Secondary | ICD-10-CM

## 2018-04-21 DIAGNOSIS — G4711 Idiopathic hypersomnia with long sleep time: Secondary | ICD-10-CM

## 2018-04-21 MED ORDER — AMPHETAMINE-DEXTROAMPHETAMINE 10 MG PO TABS: 10.0000 mg | ORAL_TABLET | Freq: Two times a day (BID) | ORAL | 0 refills | Status: DC

## 2018-04-21 NOTE — Telephone Encounter (Signed)
Pt has called for a refill of amphetamine-dextroamphetamine (ADDERALL) 10 MG tablet [facility] [address] [place] - [address] [phone] (Phone) [phone] (Fax)

## 2018-04-21 NOTE — Addendum Note (Signed)
Addended by: Candis Schatz I on: 04/21/2018 11:44 AM   Modules accepted: Orders

## 2018-04-26 ENCOUNTER — Other Ambulatory Visit: Payer: Self-pay

## 2018-04-26 ENCOUNTER — Encounter: Payer: Self-pay | Admitting: Obstetrics and Gynecology

## 2018-04-26 ENCOUNTER — Ambulatory Visit: Payer: Managed Care, Other (non HMO) | Admitting: Obstetrics and Gynecology

## 2018-04-26 ENCOUNTER — Telehealth: Payer: Self-pay | Admitting: Obstetrics and Gynecology

## 2018-04-26 VITALS — BP 102/64 | HR 100 | Temp 97.9°F | Ht 65.5 in | Wt 133.0 lb

## 2018-04-26 DIAGNOSIS — R300 Dysuria: Secondary | ICD-10-CM | POA: Diagnosis not present

## 2018-04-26 DIAGNOSIS — R8290 Unspecified abnormal findings in urine: Secondary | ICD-10-CM | POA: Diagnosis not present

## 2018-04-26 LAB — POCT URINALYSIS DIPSTICK
Bilirubin, UA: NEGATIVE
Glucose, UA: POSITIVE — AB
Ketones, UA: NEGATIVE
Nitrite, UA: POSITIVE
Protein, UA: NEGATIVE
Urobilinogen, UA: 0.2 E.U./dL
pH, UA: 5 (ref 5.0–8.0)

## 2018-04-26 MED ORDER — CIPROFLOXACIN HCL 500 MG PO TABS: 500.0000 mg | ORAL_TABLET | Freq: Two times a day (BID) | ORAL | 0 refills | Status: DC

## 2018-04-26 NOTE — Telephone Encounter (Signed)
Patient called with concerns about passing blood clots and she has had a partial hysterectomy. She'd like an appointment.  Last seen: 04/15/17

## 2018-04-26 NOTE — Telephone Encounter (Signed)
Spoke with patient. She states this morning after a void and bowel movement she noticed nickel sized blood clots in the toilet and blood in the toilet. Had pain with urination but attributes this to straining to void, not straining to have bowel movement.  No fevers, nausea, vomiting or flank pain.  Type 1 DM, BS this morning was 248.  No hx of Hemmhorroids.  TVH in 2004. Office visit this morning with Dr.Silva scheduled.  Aware work in appointment.  Pt agreeable.  Advised if anything worsens prior to appointment to go directly to emergency department. Pt verbalizes understanding.  Encounter to Dr. Edward Jolly and will close.

## 2018-04-26 NOTE — Progress Notes (Signed)
GYNECOLOGY  VISIT   HPI: 52 y.o.   Divorced  [place]  female   G0P0000 with Patient's last menstrual period was 06/28/2000. here for vaginal bleeding and dysuria.  Blood clots in her urine starting this am.  No dysuria.  Just feels uncomfortable. Relief when she voids.  Last UTI was a few years ago.   Feels cold.  Feels tired.  Some nausea and no vomiting.  No back pain.  No hx renal stones.  Last hs had constipation and finally had a BM this am. No blood with BM.  Hx vaginal hysterectomy in 2004.   BS 248 this am.  Usually in 90s to low 100s.   GYNECOLOGIC HISTORY: Patient's last menstrual period was 06/28/2000. Contraception:  Hysterectomy Menopausal hormone therapy: Premarin 0.9 Last mammogram: 04-15-17 Neg/density D/BiRads1 Last pap smear: 04-15-17 Neg:Neg HR HPV                              04-12-16 Neg:Neg HR HPV        OB History    Gravida  0   Para  0   Term  0   Preterm  0   AB  0   Living  0     SAB  0   TAB  0   Ectopic  0   Multiple  0   Live Births                 Patient Active Problem List   Diagnosis Date Noted  . Confusion 11/28/2017  . Dizziness 11/28/2017  . Insulin pump in place 09/22/2017  . Cellulitis 09/21/2017  . Diabetic ketoacidosis without coma associated with diabetes mellitus due to underlying condition (HCC) 09/21/2017  . AKI (acute kidney injury) (HCC) 09/20/2017  . Hyperglycemia 09/20/2017  . Hypotension 09/20/2017  . Sepsis (HCC) 09/20/2017  . Intermittent confusion 04/18/2017  . OSA (obstructive sleep apnea) 04/18/2017  . HYPOTHYROIDISM 09/21/2007  . DIABETES MELLITUS, TYPE I 09/21/2007  . ANXIETY DEPRESSION 09/21/2007  . NAUSEA 09/21/2007    Past Medical History:  Diagnosis Date  . Breast cyst    left  . 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 Diabetes mellitus   . Dry eyes, bilateral 2018  . Ketoacidosis 1610,9604  . Meningitis 06/2001   --hospital  . Sleep  apnea 2013  . 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 Stress   . Thyroid disease    hypothyroidism  . VAIN I (vaginal intraepithelial neoplasia grade I) 2013    Past Surgical History:  Procedure Laterality Date  . ABDOMINAL HYSTERECTOMY     2004  . CERVICAL BIOPSY  W/ LOOP ELECTRODE EXCISION  01-24-95   CIN I  . COLPOSCOPY  11-21-02   CIN III  . COLPOSCOPY  05-05-12   no lesions--needs repeat pap 08/2012 per Dr. Tresa Res  . HAND SURGERY  2004   tore tendon  . positive HPV  03/21/15   normal pap and negative types 16/18  . SOFT TISSUE CYST EXCISION  12/2010   left breast epidermoid inclusion cyst  . SPINE SURGERY  05/21/14  . TONSILLECTOMY AND ADENOIDECTOMY      Current Outpatient Medications  Medication Sig Dispense Refill  . ALPRAZolam (XANAX) 0.5 MG tablet Take 0.25 mg by mouth as needed.     Marland Kitchen amphetamine-dextroamphetamine (ADDERALL XR) 25 MG 24 hr capsule Take 1 capsule by mouth 2 (  two) times daily. 60 capsule 0  . amphetamine-dextroamphetamine (ADDERALL) 10 MG tablet Take 1 tablet (10 mg total) by mouth 2 (two) times daily. 60 tablet 0  . Armodafinil 150 MG tablet TAKE ONE TABLET BY MOUTH DAILY 30 tablet 5  . BAYER CONTOUR NEXT TEST test strip     . buPROPion (WELLBUTRIN SR) 150 MG 12 hr tablet Take 2 tablets by mouth daily.     Marland Kitchen estrogens, conjugated, (PREMARIN) 0.9 MG tablet Take 1 tablet (0.9 mg total) by mouth daily. 30 tablet 0  . insulin lispro (HUMALOG) 100 UNIT/ML injection Inject 25 Units into the skin daily.     Marland Kitchen levothyroxine (SYNTHROID, LEVOTHROID) 137 MCG tablet Take 137 mcg by mouth daily.    . Prucalopride Succinate (MOTEGRITY) 2 MG TABS Take by mouth.    . Vitamin D, Ergocalciferol, (DRISDOL) 50000 units CAPS capsule Take 50,000 Units by mouth every 7 (seven) days.     Marland Kitchen XIIDRA 5 % SOLN Apply to eye 2 (two) times daily.     No current facility-administered medications for this visit.      ALLERGIES: Bee venom; Penicillins; and Sulfa  antibiotics  Family History  Problem Relation Age of Onset  . Other Mother        cysts  . Diabetes Father   . Kidney disease Father   . Stroke Paternal Aunt   . Breast cancer Neg Hx     Social History   Socioeconomic History  . Marital status: Divorced    Spouse name: Not on file  . Number of children: 0  . Years of education: undergrad  . Highest education level: Not on file  Occupational History  . Occupation: SAP PDM ANALYST    Employer: [employer]    Employer: [employer]  Social Needs  . Financial resource strain: Not on file  . Food insecurity:    Worry: Not on file    Inability: Not on file  . Transportation needs:    Medical: Not on file    Non-medical: Not on file  Tobacco Use  . Smoking status: Never Smoker  . Smokeless tobacco: Never Used  Substance and Sexual Activity  . Alcohol use: No    Alcohol/week: 0.0 standard drinks  . Drug use: No  . Sexual activity: Yes    Partners: Male    Birth control/protection: Surgical, Condom    Comment: TVH  Lifestyle  . Physical activity:    Days per week: Not on file    Minutes per session: Not on file  . Stress: Not on file  Relationships  . Social connections:    Talks on phone: Not on file    Gets together: Not on file    Attends religious service: Not on file    Active member of club or organization: Not on file    Attends meetings of clubs or organizations: Not on file    Relationship status: Not on file  . Intimate partner violence:    Fear of current or ex partner: Not on file    Emotionally abused: Not on file    Physically abused: Not on file    Forced sexual activity: Not on file  Other Topics Concern  . Not on file  Social History Narrative   Consumes 120mg  caffeine daily,left handed    Review of Systems  Gastrointestinal: Positive for blood in stool.  Genitourinary: Positive for dysuria, frequency and hematuria.  All other systems reviewed and are negative.  PHYSICAL  EXAMINATION:    BP 102/64 (BP Location: Right Arm, Patient Position: Sitting, Cuff Size: Normal)   Pulse 100   Ht 5' 5.5" (1.664 m)   Wt 133 lb (60.3 kg)   LMP 06/28/2000   BMI 21.80 kg/m     General appearance: alert, cooperative and appears stated age Lungs: clear to auscultation bilaterally Heart: regular rate and rhythm Abdomen: soft, non-tender, no masses,  no organomegaly Back - No CVA tenderness. No abnormal inguinal nodes palpated   Pelvic: External genitalia:  no lesions              Urethra:  normal appearing urethra with no masses, tenderness or lesions              Bartholins and Skenes: normal                 Vagina: normal appearing vagina with normal color and discharge, no lesions.  Tender anterior vaginal wall.               Cervix: absent                Bimanual Exam:  Uterus:   Absent.              Adnexa: no mass, fullness, tenderness            Chaperone was present for exam.  ASSESSMENT  Hemorrhagia cystitis.  Status post hysterectomy.  DM.   PLAN  Urine micro and cx.  Ciprofloxacin 500 mg po bid x 7 days.  Hydrate well.  Return in 2 days for a recheck and Annual exam.  To ER if worsens.    An After Visit Summary was printed and given to the patient.  __15____ minutes face to face time of which over 50% was spent in counseling.

## 2018-04-26 NOTE — Patient Instructions (Signed)

## 2018-04-27 LAB — URINALYSIS, MICROSCOPIC ONLY
Casts: NONE SEEN /lpf
RBC, UA: >30 /hpf — AB (ref 0–2)
WBC, UA: >30 /hpf — AB (ref 0–5)

## 2018-04-27 NOTE — Progress Notes (Signed)
52 y.o. G0P0000 Divorced [place] American female here for annual exam.    On Premarin 0.9 mg.   Patient still having some urinary urgency for a 3 months.  Not up at night to void.  Drinks tea - 4 times a day.  Worried about potential for urinary incontinence. On Ciprofloxacin for hemorrhagic E Coli UTI.   Has a lot of gas.  Not taking probiotics.  PCP: Adrian Prince, MD    Patient's last menstrual period was 06/28/2000.           Sexually active: Yes.   female The current method of family planning is status post hysterectomy.    Exercising: No.  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 02/2013 Ascus with Neg HR HPV MMG: 04-15-17 Neg/density D/BiRads1.  She will schedule.  Colonoscopy:  05-02-13 normal with Dr.Mann;next due 04/2020 BMD:   n/a  Result  n/a TDaP:  03-09-13 Gardasil:   no HIV: 2016 NR Hep C: Unsure Screening Labs:  Hb today: PCP Flu vaccine:  Taken.    reports that she has never smoked. She has never used smokeless tobacco. She reports that she does not drink alcohol or use drugs.  Past Medical History:  Diagnosis Date  . Breast cyst    left  . 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 Diabetes mellitus   . Dry eyes, bilateral 2018  . Ketoacidosis 1610,9604  . Meningitis 06/2001   --hospital  . Sleep apnea 2013  . 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 Stress   . Thyroid disease    hypothyroidism  . VAIN I (vaginal intraepithelial neoplasia grade I) 2013    Past Surgical History:  Procedure Laterality Date  . ABDOMINAL HYSTERECTOMY     2004  . CERVICAL BIOPSY  W/ LOOP ELECTRODE EXCISION  01-24-95   CIN I  . COLPOSCOPY  11-21-02   CIN III  . COLPOSCOPY  05-05-12   no lesions--needs repeat pap 08/2012 per Dr. Tresa Res  .  HAND SURGERY  2004   tore tendon  . positive HPV  03/21/15   normal pap and negative types 16/18  . SOFT TISSUE CYST EXCISION  12/2010   left breast epidermoid inclusion cyst  . SPINE SURGERY  05/21/14  . TONSILLECTOMY AND ADENOIDECTOMY      Current Outpatient Medications  Medication Sig Dispense Refill  . ALPRAZolam (XANAX) 0.5 MG tablet Take 0.25 mg by mouth as needed.     Marland Kitchen amphetamine-dextroamphetamine (ADDERALL XR) 25 MG 24 hr capsule Take 1 capsule by mouth 2 (two) times daily. 60 capsule 0  . amphetamine-dextroamphetamine (ADDERALL) 10 MG tablet Take 1 tablet (10 mg total) by mouth 2 (two) times daily. 60 tablet 0  . Armodafinil 150 MG tablet TAKE ONE TABLET BY MOUTH DAILY 30 tablet 5  . BAYER CONTOUR NEXT TEST test strip     . buPROPion (WELLBUTRIN SR) 150 MG 12 hr tablet Take 2 tablets by mouth daily.     . ciprofloxacin (CIPRO) 500 MG tablet Take 1 tablet (500 mg total) by mouth 2 (two) times daily. 14 tablet 0  . estrogens, conjugated, (PREMARIN) 0.9 MG tablet Take 1 tablet (0.9 mg total) by mouth daily. 30 tablet 0  . insulin lispro (  HUMALOG) 100 UNIT/ML injection Inject 25 Units into the skin daily.     Marland Kitchen levothyroxine (SYNTHROID, LEVOTHROID) 137 MCG tablet Take 137 mcg by mouth daily.    . Prucalopride Succinate (MOTEGRITY) 2 MG TABS Take by mouth.    . Vitamin D, Ergocalciferol, (DRISDOL) 50000 units CAPS capsule Take 50,000 Units by mouth every 7 (seven) days.     Marland Kitchen XIIDRA 5 % SOLN Apply to eye 2 (two) times daily.     No current facility-administered medications for this visit.     Family History  Problem Relation Age of Onset  . Other Mother        cysts  . Diabetes Father   . Kidney disease Father   . Stroke Paternal Aunt   . Breast cancer Neg Hx     Review of Systems  All other systems reviewed and are negative.   Exam:   BP 122/62 (BP Location: Right Arm, Patient Position: Sitting, Cuff Size: Normal)   Pulse 90   Resp 16   Ht 5' 5.5" (1.664 m)   Wt  133 lb 3.2 oz (60.4 kg)   LMP 06/28/2000   BMI 21.83 kg/m     General appearance: alert, cooperative and appears stated age Head: Normocephalic, without obvious abnormality, atraumatic Neck: no adenopathy, supple, symmetrical, trachea midline and thyroid normal to inspection and palpation Lungs: clear to auscultation bilaterally Breasts: normal appearance, no masses or tenderness, No nipple retraction or dimpling, No nipple discharge or bleeding, No axillary or supraclavicular adenopathy Heart: regular rate and rhythm Abdomen:  2 monitors attached to her abdominal skin with adhesive.  Abdomen is soft, non-tender; no masses, no organomegaly Extremities: extremities normal, atraumatic, no cyanosis or edema Skin: Skin color, texture, turgor normal. No rashes or lesions Lymph nodes: Cervical, supraclavicular, and axillary nodes normal. No abnormal inguinal nodes palpated Neurologic: Grossly normal  Pelvic: External genitalia:  no lesions              Urethra:  normal appearing urethra with no masses, tenderness or lesions              Bartholins and Skenes: normal                 Vagina: normal appearing vagina with normal color and discharge, no lesions              Cervix:  absent              Pap taken: Yes.   Bimanual Exam:  Uterus:   absent              Adnexa: no mass, fullness, tenderness              Rectal exam: Yes.  .  Confirms.              Anus:  normal sphincter tone, no lesions  Chaperone was present for exam.  Assessment:   Well woman visit with normal exam. Status post TVH.  History of CIN III and VAIN I.  Pap 2016 normal and positive HR HPV, negative 16/18. ERT patient.   HSV.   DM. Varicose veins.  Recent hemorrhagic UTI.  On Ciprofloxacin. Urinary urgency.   Plan: Mammogram screening. Recommended self breast awareness. Pap and HR HPV as above. Guidelines for Calcium, Vitamin D, regular exercise program including cardiovascular and weight bearing  exercise. Will decrease to Premarin 0.625 mg daily.  Discussed WHI and risks of stroke, DVT, PE.  Finish course of  abx. Decrease use of bladder irritants.  Follow up annually and prn.    After visit summary provided.

## 2018-04-28 ENCOUNTER — Ambulatory Visit (INDEPENDENT_AMBULATORY_CARE_PROVIDER_SITE_OTHER): Payer: Managed Care, Other (non HMO) | Admitting: Obstetrics and Gynecology

## 2018-04-28 ENCOUNTER — Encounter: Payer: Self-pay | Admitting: Obstetrics and Gynecology

## 2018-04-28 ENCOUNTER — Other Ambulatory Visit (HOSPITAL_COMMUNITY)
Admission: RE | Admit: 2018-04-28 | Discharge: 2018-04-28 | Disposition: A | Discharge status: Home / Self Care | Payer: Managed Care, Other (non HMO) | Source: Ambulatory Visit | Attending: Obstetrics and Gynecology | Admitting: Obstetrics and Gynecology

## 2018-04-28 ENCOUNTER — Other Ambulatory Visit: Payer: Self-pay

## 2018-04-28 VITALS — BP 122/62 | HR 90 | Resp 16 | Ht 65.5 in | Wt 133.2 lb

## 2018-04-28 DIAGNOSIS — Z01419 Encounter for gynecological examination (general) (routine) without abnormal findings: Secondary | ICD-10-CM | POA: Diagnosis present

## 2018-04-28 LAB — URINE CULTURE

## 2018-04-28 MED ORDER — ESTROGENS CONJUGATED 0.625 MG PO TABS: 0.6250 mg | ORAL_TABLET | Freq: Every day | ORAL | 3 refills | Status: DC

## 2018-04-28 NOTE — Patient Instructions (Signed)

## 2018-05-03 LAB — CYTOLOGY - PAP
Diagnosis: NEGATIVE
HPV: NOT DETECTED

## 2018-05-08 ENCOUNTER — Other Ambulatory Visit: Payer: Self-pay | Admitting: Obstetrics and Gynecology

## 2018-05-08 DIAGNOSIS — Z1231 Encounter for screening mammogram for malignant neoplasm of breast: Secondary | ICD-10-CM

## 2018-05-23 ENCOUNTER — Other Ambulatory Visit: Payer: Self-pay | Admitting: Neurology

## 2018-05-23 DIAGNOSIS — G4711 Idiopathic hypersomnia with long sleep time: Secondary | ICD-10-CM

## 2018-05-23 DIAGNOSIS — G4733 Obstructive sleep apnea (adult) (pediatric): Secondary | ICD-10-CM

## 2018-05-23 DIAGNOSIS — Z9989 Dependence on other enabling machines and devices: Secondary | ICD-10-CM

## 2018-05-23 MED ORDER — AMPHETAMINE-DEXTROAMPHETAMINE 10 MG PO TABS: 10.0000 mg | ORAL_TABLET | Freq: Two times a day (BID) | ORAL | 0 refills | Status: DC

## 2018-05-23 MED ORDER — AMPHETAMINE-DEXTROAMPHET ER 25 MG PO CP24: 25.0000 mg | ORAL_CAPSULE | Freq: Two times a day (BID) | ORAL | 0 refills | Status: DC

## 2018-05-23 NOTE — Telephone Encounter (Signed)
Pt is up to date on her appts and is due for a refill. [place] [facility] checked. 

## 2018-05-23 NOTE — Telephone Encounter (Signed)
Pt requesting refills for amphetamine-dextroamphetamine (ADDERALL XR) 25 MG 24 hr capsule and amphetamine-dextroamphetamine (ADDERALL) 10 MG tablet sent to [facility]

## 2018-05-28 DIAGNOSIS — Y92009 Unspecified place in unspecified non-institutional (private) residence as the place of occurrence of the external cause: Secondary | ICD-10-CM

## 2018-05-28 DIAGNOSIS — W19XXXA Unspecified fall, initial encounter: Secondary | ICD-10-CM

## 2018-05-28 HISTORY — DX: Unspecified place in unspecified non-institutional (private) residence as the place of occurrence of the external cause: Y92.009

## 2018-05-28 HISTORY — DX: Unspecified place in unspecified non-institutional (private) residence as the place of occurrence of the external cause: W19.XXXA

## 2018-06-01 ENCOUNTER — Other Ambulatory Visit: Payer: Self-pay

## 2018-06-01 ENCOUNTER — Encounter (HOSPITAL_COMMUNITY): Payer: Self-pay

## 2018-06-01 ENCOUNTER — Emergency Department (HOSPITAL_COMMUNITY)
Admission: EM | Admit: 2018-06-01 | Discharge: 2018-06-01 | Disposition: A | Discharge status: Home / Self Care | Payer: Managed Care, Other (non HMO) | Attending: Emergency Medicine | Admitting: Emergency Medicine

## 2018-06-01 DIAGNOSIS — E162 Hypoglycemia, unspecified: Secondary | ICD-10-CM | POA: Diagnosis present

## 2018-06-01 DIAGNOSIS — E1165 Type 2 diabetes mellitus with hyperglycemia: Secondary | ICD-10-CM | POA: Diagnosis not present

## 2018-06-01 DIAGNOSIS — E1110 Type 2 diabetes mellitus with ketoacidosis without coma: Secondary | ICD-10-CM | POA: Insufficient documentation

## 2018-06-01 DIAGNOSIS — Z79899 Other long term (current) drug therapy: Secondary | ICD-10-CM | POA: Diagnosis not present

## 2018-06-01 LAB — CBC WITH DIFFERENTIAL/PLATELET
Abs Immature Granulocytes: 0.02 10*3/uL (ref 0.00–0.07)
Basophils Absolute: 0 10*3/uL (ref 0.0–0.1)
Basophils Relative: 0 %
Eosinophils Absolute: 0 10*3/uL (ref 0.0–0.5)
Eosinophils Relative: 0 %
HCT: 45 % (ref 36.0–46.0)
Hemoglobin: 14.1 g/dL (ref 12.0–15.0)
Immature Granulocytes: 0 %
Lymphocytes Relative: 11 %
Lymphs Abs: 0.6 10*3/uL — ABNORMAL LOW (ref 0.7–4.0)
MCH: 31.3 pg (ref 26.0–34.0)
MCHC: 31.3 g/dL (ref 30.0–36.0)
MCV: 99.8 fL (ref 80.0–100.0)
Monocytes Absolute: 0.2 10*3/uL (ref 0.1–1.0)
Monocytes Relative: 3 %
Neutro Abs: 4.9 10*3/uL (ref 1.7–7.7)
Neutrophils Relative %: 86 %
Platelets: 239 10*3/uL (ref 150–400)
RBC: 4.51 MIL/uL (ref 3.87–5.11)
RDW: 12.6 % (ref 11.5–15.5)
WBC: 5.7 10*3/uL (ref 4.0–10.5)
nRBC: 0 % (ref 0.0–0.2)

## 2018-06-01 LAB — BASIC METABOLIC PANEL
Anion gap: 7 (ref 5–15)
BUN: 16 mg/dL (ref 6–20)
CO2: 31 mmol/L (ref 22–32)
Calcium: 9.3 mg/dL (ref 8.9–10.3)
Chloride: 101 mmol/L (ref 98–111)
Creatinine, Ser: 0.66 mg/dL (ref 0.44–1.00)
GFR calc Af Amer: >60 mL/min (ref >60)
GFR calc non Af Amer: >60 mL/min (ref >60)
Glucose, Bld: 321 mg/dL — ABNORMAL HIGH (ref 70–99)
Potassium: 3.9 mmol/L (ref 3.5–5.1)
Sodium: 139 mmol/L (ref 135–145)

## 2018-06-01 LAB — CBG MONITORING, ED: Glucose-Capillary: 186 mg/dL — ABNORMAL HIGH (ref 70–99)

## 2018-06-01 LAB — TROPONIN I: Troponin I: <0.03 ng/mL (ref <0.03)

## 2018-06-01 NOTE — ED Triage Notes (Addendum)
Pt BIB [facility] for c/o low blood sugar. Ate "half a bottle" of glucose tablets due to waking up and feeling bad. CBG 69 upon EMS arrival. EMS gave 15g oral glucose. CBG now 263. Pt states she just "feels bad" denies pain, denies n/v

## 2018-06-01 NOTE — Discharge Instructions (Addendum)
You were evaluated in the emergency department for a low sugar that probably happened overnight due to a faulty pump.  Your blood counts did not show any obvious cause of your low blood sugar.  Your symptoms improved here.  Please restart your insulin pump when you get home and confirm that it is working correctly.  Return if any concerns.

## 2018-06-01 NOTE — ED Notes (Signed)
Pt cbg is now 186 here in the ED. Pt states she had taken an unknown amount of glucose tablets.

## 2018-06-01 NOTE — ED Provider Notes (Signed)
[facility]-EMERGENCY DEPT Provider Note   CSN: [id number] Arrival date & time: 06/01/18  0553     History   Chief Complaint Chief Complaint  Patient presents with  . Hypoglycemia    HPI [name] [name] is a 52 y.o. female.  She has a history of insulin-dependent diabetes is on a continuous pump.  She said she has a new pump since earlier this week and has been having some trouble with it.  She said it was working last evening when she went to bed but this morning she remembers feeling very sluggish and was on the floor trying to take some glucose tabs.  She thinks she was able to contact 911 because the paramedics found her and gave her some oral glucose with some improvement.  She said she is never had low blood sugars before where she could not just get herself out of it by taking 1 or 2 tablets.  She thinks she might of been with a low blood sugar for a long time as she was very slow to feel back to normal.  Currently now she just feels cold.  No recent illness.  No change in medications.  The history is provided by the patient.  Hypoglycemia  Severity:  Severe Onset quality:  Unable to specify Progression:  Resolved Chronicity:  Recurrent Diabetic status:  Controlled with insulin Relieved by:  Oral glucose Associated symptoms: altered mental status   Associated symptoms: no shortness of breath     Past Medical History:  Diagnosis Date  . Breast cyst    left  . 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 Diabetes mellitus   . Dry eyes, bilateral 2018  . Ketoacidosis [id number]  . Meningitis 06/2001   --hospital  . Sleep apnea 2013  . 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 Stress   . Thyroid disease    hypothyroidism  . VAIN I (vaginal intraepithelial neoplasia grade I) 2013    Patient Active Problem List   Diagnosis Date Noted  . Confusion 11/28/2017  . Dizziness 11/28/2017  . Insulin pump in place 09/22/2017  .  Cellulitis 09/21/2017  . Diabetic ketoacidosis without coma associated with diabetes mellitus due to underlying condition (HCC) 09/21/2017  . AKI (acute kidney injury) (HCC) 09/20/2017  . Hyperglycemia 09/20/2017  . Hypotension 09/20/2017  . Sepsis (HCC) 09/20/2017  . Intermittent confusion 04/18/2017  . OSA (obstructive sleep apnea) 04/18/2017  . HYPOTHYROIDISM 09/21/2007  . DIABETES MELLITUS, TYPE I 09/21/2007  . ANXIETY DEPRESSION 09/21/2007  . NAUSEA 09/21/2007    Past Surgical History:  Procedure Laterality Date  . ABDOMINAL HYSTERECTOMY     2004  . CERVICAL BIOPSY  W/ LOOP ELECTRODE EXCISION  01-24-95   CIN I  . COLPOSCOPY  11-21-02   CIN III  . COLPOSCOPY  05-05-12   no lesions--needs repeat pap 08/2012 per Dr. Tresa Resomine  . HAND SURGERY  2004   tore tendon  . positive HPV  03/21/15   normal pap and negative types 16/18  . SOFT TISSUE CYST EXCISION  12/2010   left breast epidermoid inclusion cyst  . SPINE SURGERY  05/21/14  . TONSILLECTOMY AND ADENOIDECTOMY       OB History    Gravida  0   Para  0   Term  0   Preterm  0   AB  0   Living  0     SAB  0   TAB  0   Ectopic  0   Multiple  0   Live Births               Home Medications    Prior to Admission medications   Medication Sig Start Date End Date Taking? Authorizing Provider  ALPRAZolam Prudy Feeler) 0.5 MG tablet Take 0.25 mg by mouth as needed for anxiety.    Yes [provider]  amphetamine-dextroamphetamine (ADDERALL XR) 25 MG 24 hr capsule Take 1 capsule by mouth 2 (two) times daily. 05/23/18  Yes Huston Foley, MD  amphetamine-dextroamphetamine (ADDERALL) 10 MG tablet Take 1 tablet (10 mg total) by mouth 2 (two) times daily. 05/23/18  Yes Huston Foley, MD  Armodafinil 150 MG tablet TAKE ONE TABLET BY MOUTH DAILY 01/27/17  Yes Huston Foley, MD  buPROPion (WELLBUTRIN SR) 150 MG 12 hr tablet Take 2 tablets by mouth daily.  02/28/13  Yes [provider]  doxycycline (VIBRAMYCIN) 100 MG  capsule Take 100 mg by mouth 2 (two) times daily. 05/20/18  Yes [provider]  estrogens, conjugated, (PREMARIN) 0.625 MG tablet Take 1 tablet (0.625 mg total) by mouth daily. 04/28/18  Yes Patton Salles, MD  Insulin Human (INSULIN PUMP) SOLN Inject 25 each into the skin as directed. Throughout the day (Per BS)   Yes [provider]  insulin lispro (HUMALOG) 100 UNIT/ML injection Inject 25 Units into the skin as needed for high blood sugar.    Yes [provider]  levothyroxine (SYNTHROID, LEVOTHROID) 137 MCG tablet Take 137 mcg by mouth daily. 10/03/17  Yes [provider]  Prucalopride Succinate (MOTEGRITY) 2 MG TABS Take 2 mg by mouth daily.    Yes [provider]  Vitamin D, Ergocalciferol, (DRISDOL) 50000 units CAPS capsule Take 50,000 Units by mouth every 7 (seven) days.  02/09/16  Yes [provider]  XIIDRA 5 % SOLN Apply to eye 2 (two) times daily. 03/17/16  Yes [provider]  BAYER CONTOUR NEXT TEST test strip  02/24/16   [provider]  ciprofloxacin (CIPRO) 500 MG tablet Take 1 tablet (500 mg total) by mouth 2 (two) times daily. Patient not taking: Reported on 06/01/2018 04/26/18   Patton Salles, MD    Family History Family History  Problem Relation Age of Onset  . Other Mother        cysts  . Diabetes Father   . Kidney disease Father   . Stroke Paternal Aunt   . Breast cancer Neg Hx     Social History Social History   Tobacco Use  . Smoking status: Never Smoker  . Smokeless tobacco: Never Used  Substance Use Topics  . Alcohol use: No    Alcohol/week: 0.0 standard drinks  . Drug use: No     Allergies   Bee venom; Penicillins; and Sulfa antibiotics   Review of Systems Review of Systems  Constitutional: Negative for fever.  HENT: Negative for sore throat.   Eyes: Negative for visual disturbance.  Respiratory: Negative for shortness of breath.   Cardiovascular:  Negative for chest pain.  Gastrointestinal: Negative for abdominal pain.  Genitourinary: Negative for dysuria.  Musculoskeletal: Negative for back pain.  Skin: Negative for rash.  Neurological: Negative for headaches.     Physical Exam Updated Vital Signs BP 122/65 (BP Location: Left Arm)   Pulse 72   Resp 17   Ht 5\' 4"  (1.626 m)   Wt 59 kg   LMP 06/28/2000  SpO2 100%   BMI 22.31 kg/m   Physical Exam  Constitutional: She is oriented to person, place, and time. She appears well-developed and well-nourished. No distress.  HENT:  Head: Normocephalic and atraumatic.  Eyes: Conjunctivae are normal.  Neck: Neck supple.  Cardiovascular: Normal rate and regular rhythm.  No murmur heard. Pulmonary/Chest: Effort normal and breath sounds normal. No respiratory distress.  Abdominal: Soft. There is no tenderness.  Musculoskeletal: She exhibits no edema or deformity.  Neurological: She is alert and oriented to person, place, and time. She has normal strength. No cranial nerve deficit or sensory deficit. GCS eye subscore is 4. GCS verbal subscore is 5. GCS motor subscore is 6.  Skin: Skin is warm and dry.  Psychiatric: She has a normal mood and affect.  Nursing note and vitals reviewed.    ED Treatments / Results  Labs (all labs ordered are listed, but only abnormal results are displayed) Labs Reviewed  BASIC METABOLIC PANEL - Abnormal; Notable for the following components:      Result Value   Glucose, Bld 321 (*)    All other components within normal limits  CBC WITH DIFFERENTIAL/PLATELET - Abnormal; Notable for the following components:   Lymphs Abs 0.6 (*)    All other components within normal limits  CBG MONITORING, ED - Abnormal; Notable for the following components:   Glucose-Capillary 186 (*)    All other components within normal limits  TROPONIN I    EKG EKG Interpretation  Date/Time:  Thursday June 01 2018 07:52:53 EST Ventricular Rate:  82 PR Interval:      QRS Duration: 76 QT Interval:  387 QTC Calculation: 452 R Axis:   75 Text Interpretation:  Sinus rhythm Low voltage, precordial leads Probable anteroseptal infarct, old no prior to compare with Confirmed by Meridee Score [id number]) on 06/01/2018 7:59:09 AM   Radiology No results found.  Procedures Procedures (including critical care time)  Medications Ordered in ED Medications - No data to display   Initial Impression / Assessment and Plan / ED Course  I have reviewed the triage vital signs and the nursing notes.  Pertinent labs & imaging results that were available during my care of the patient were reviewed by me and considered in my medical decision making (see chart for details).  Clinical Course as of Jun 01 1738  Thu Jun 01, 2018  6045 Reevaluated-patient is up and ambulating to the bathroom to give a sample.  She is that she is feeling improved.   [MB]  1004 Patient feels comfortable with discharge and she is going to go home and get her new insulin pump.  She says she has the ability to check her sugars and administer on insulin if the pump is not functional.   [MB]    Clinical Course User Index [MB] Terrilee Files, MD     Final Clinical Impressions(s) / ED Diagnoses   Final diagnoses:  Hypoglycemia    ED Discharge Orders    None       Terrilee Files, MD 06/01/18 1739

## 2018-06-01 NOTE — ED Notes (Signed)
Bed: [id number] Expected date:  Expected time:  Means of arrival:  Comments: 30F hypoglycemia

## 2018-06-23 ENCOUNTER — Ambulatory Visit
Admission: RE | Admit: 2018-06-23 | Discharge: 2018-06-23 | Disposition: A | Discharge status: Home / Self Care | Payer: Managed Care, Other (non HMO) | Source: Ambulatory Visit | Attending: Obstetrics and Gynecology | Admitting: Obstetrics and Gynecology

## 2018-06-23 DIAGNOSIS — Z1231 Encounter for screening mammogram for malignant neoplasm of breast: Secondary | ICD-10-CM

## 2018-06-27 ENCOUNTER — Other Ambulatory Visit: Payer: Self-pay | Admitting: Obstetrics and Gynecology

## 2018-06-27 DIAGNOSIS — R928 Other abnormal and inconclusive findings on diagnostic imaging of breast: Secondary | ICD-10-CM

## 2018-06-30 ENCOUNTER — Ambulatory Visit
Admission: RE | Admit: 2018-06-30 | Discharge: 2018-06-30 | Disposition: A | Discharge status: Home / Self Care | Payer: Managed Care, Other (non HMO) | Source: Ambulatory Visit | Attending: Obstetrics and Gynecology | Admitting: Obstetrics and Gynecology

## 2018-06-30 ENCOUNTER — Other Ambulatory Visit: Payer: Self-pay | Admitting: Obstetrics and Gynecology

## 2018-06-30 ENCOUNTER — Other Ambulatory Visit: Payer: Self-pay

## 2018-06-30 DIAGNOSIS — R928 Other abnormal and inconclusive findings on diagnostic imaging of breast: Secondary | ICD-10-CM

## 2018-06-30 DIAGNOSIS — N6489 Other specified disorders of breast: Secondary | ICD-10-CM

## 2018-07-05 ENCOUNTER — Other Ambulatory Visit: Payer: Self-pay | Admitting: Neurology

## 2018-07-05 DIAGNOSIS — Z9989 Dependence on other enabling machines and devices: Secondary | ICD-10-CM

## 2018-07-05 DIAGNOSIS — G4711 Idiopathic hypersomnia with long sleep time: Secondary | ICD-10-CM

## 2018-07-05 DIAGNOSIS — G4733 Obstructive sleep apnea (adult) (pediatric): Secondary | ICD-10-CM

## 2018-07-05 MED ORDER — AMPHETAMINE-DEXTROAMPHETAMINE 10 MG PO TABS: 10.0000 mg | ORAL_TABLET | Freq: Two times a day (BID) | ORAL | 0 refills | Status: DC

## 2018-07-05 MED ORDER — AMPHETAMINE-DEXTROAMPHET ER 25 MG PO CP24: 25.0000 mg | ORAL_CAPSULE | Freq: Two times a day (BID) | ORAL | 0 refills | Status: DC

## 2018-07-05 NOTE — Telephone Encounter (Signed)
Patient called and requested a refill on rx adderral.

## 2018-07-05 NOTE — Telephone Encounter (Signed)
Pt is up to date on her appts. Pt is due for a refill on both of her adderalls. [place] [facility] checked. Will send to Dr. Frances Furbish for review.

## 2018-07-06 ENCOUNTER — Telehealth: Payer: Self-pay

## 2018-07-06 NOTE — Telephone Encounter (Signed)
Received PA for adderall from Karin Golden for [employer]. Key: [id number]. Completed via covermymeds and sent to [facility]. Should have a determination in 3-5 business days.

## 2018-07-06 NOTE — Telephone Encounter (Signed)
PA has been approved. [employer] informed. PA Case: [id number], Status: Approved, Coverage Starts on: 07/06/2018 12:00:00 AM, Coverage Ends on: 07/06/2019 12:00:00 AM

## 2018-07-10 NOTE — Telephone Encounter (Signed)
Received this notice from covermymeds: "PA Case: [id number], Status: Approved, Coverage Starts on: 07/10/2018 12:00:00 AM, Coverage Ends on: 07/10/2019 12:00:00 AM."  Karin Golden notified.

## 2018-07-10 NOTE — Telephone Encounter (Signed)
Completed PA via covermymeds for adderall 10mg . Key: [id number]. Sent to [employer]. Should have a determination in 3-5 business days.

## 2018-07-25 ENCOUNTER — Other Ambulatory Visit: Payer: Self-pay | Admitting: Orthopaedic Surgery

## 2018-07-25 DIAGNOSIS — M5136 Other intervertebral disc degeneration, lumbar region: Secondary | ICD-10-CM

## 2018-07-30 ENCOUNTER — Ambulatory Visit
Admission: RE | Admit: 2018-07-30 | Discharge: 2018-07-30 | Disposition: A | Discharge status: Home / Self Care | Payer: Managed Care, Other (non HMO) | Source: Ambulatory Visit | Attending: Orthopaedic Surgery | Admitting: Orthopaedic Surgery

## 2018-07-30 DIAGNOSIS — M5136 Other intervertebral disc degeneration, lumbar region: Secondary | ICD-10-CM

## 2018-08-21 ENCOUNTER — Encounter: Payer: Self-pay | Admitting: Neurology

## 2018-08-21 ENCOUNTER — Ambulatory Visit: Payer: Managed Care, Other (non HMO) | Admitting: Neurology

## 2018-08-23 ENCOUNTER — Encounter: Payer: Self-pay | Admitting: Neurology

## 2018-08-23 ENCOUNTER — Encounter

## 2018-08-23 ENCOUNTER — Ambulatory Visit: Payer: Managed Care, Other (non HMO) | Admitting: Neurology

## 2018-08-23 VITALS — BP 147/84 | HR 114 | Ht 64.0 in | Wt 132.0 lb

## 2018-08-23 DIAGNOSIS — Z9989 Dependence on other enabling machines and devices: Secondary | ICD-10-CM | POA: Diagnosis not present

## 2018-08-23 DIAGNOSIS — G4711 Idiopathic hypersomnia with long sleep time: Secondary | ICD-10-CM | POA: Diagnosis not present

## 2018-08-23 DIAGNOSIS — G4733 Obstructive sleep apnea (adult) (pediatric): Secondary | ICD-10-CM | POA: Diagnosis not present

## 2018-08-23 MED ORDER — AMPHETAMINE-DEXTROAMPHETAMINE 10 MG PO TABS: 10.0000 mg | ORAL_TABLET | Freq: Two times a day (BID) | ORAL | 0 refills | Status: DC

## 2018-08-23 MED ORDER — AMPHETAMINE-DEXTROAMPHET ER 25 MG PO CP24: 25.0000 mg | ORAL_CAPSULE | Freq: Two times a day (BID) | ORAL | 0 refills | Status: DC

## 2018-08-23 NOTE — Progress Notes (Signed)
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 noted. No leaning to one side is noted. Posture is age-appropriate and stance is narrow based. No problems turning are noted.   Assessment and Plan:   In summary, [name] [name] is a very pleasant46 year old femalewith an underlying history of hypothyroidism, DM type I, anxiety and depression, left-sided lower back pain, excessive daytime somnolence, obstructive sleep apnea on CPAP, who presents forfollow up of her sleep disorder. She has improved her CPAP usage lately. She has been using nasal pillows successfully.she has had some soreness around the nostrils. The nasal pillows. She is advised to try to use some skin soothing and healing cream during the day time such as Aquaphor. She is advised not to use it right before sleep and not to use petroleum jelly around the nasal pillows. She has had stressors. She has had issues with anxiety and depression, she has seen a neuropsychologist. She is supposed to see psychiatryand is strongly encouraged to call them right away for an appointment as she had at least 2 appointments canceled from what I understand. In the past, we did some workup for cognitive concerns and her workup was reassuring in that regard. She had an EEG in December 2018  which was reported as normal. She had neuropsychological evaluation in the past with Dr. Valentina Shaggy on 08/08/2014 with benign results at the time. She also had workup in 2018 for TIA concern. She saw Dr. Bonita Quin. She had sleep study testing in the form of CPAP titration full night followed by a MSLT the next dayin the past. Her CPAP pressure was adequate pressure of 9 cm during the sleep study, but she had an abnormal next day nap test, in keeping with with idiopathic hypersomnolence.In thepast she had complained about memory issues. She had a head CT without contrast and MRI brain without contrast on 04/18/2017 with benign results. Of note, she alsohada normal brain MRI in July 2015. Her physical and neurological exam have been nonfocal, reassuringly. She has started seeing orthopedics and is currently in physical therapy for her low back pain. From my end of things she is advised to continue with the Adderall and Adderall XR. She can follow-up in 6 months, she is reminded to be compliant with CPAP therapy. I answered all her questions today and she was in agreement. I spent 25 minutes in total face-to-face time with the patient, more than 50% of which was spent in counseling and coordination of care, reviewing test results, reviewing medication and discussing or reviewing the diagnosis of IH, OSA, the prognosis and treatment options. Pertinent laboratory and imaging test results that were available during this visit with the patient were reviewed by me and considered in my medical decision making (see chart for details).

## 2018-08-23 NOTE — Patient Instructions (Addendum)
Please continue using your CPAP regularly. While your insurance requires that you use CPAP at least 4 hours each night on 70% of the nights, I recommend, that you not skip any nights and use it throughout the night if you can. Getting used to CPAP and staying with the treatment long term does take time and patience and discipline. Untreated obstructive sleep apnea when it is moderate to severe can have an adverse impact on cardiovascular health and raise her risk for heart disease, arrhythmias, hypertension, congestive heart failure, stroke and diabetes. Untreated obstructive sleep apnea causes sleep disruption, nonrestorative sleep, and sleep deprivation. This can have an impact on your day to day functioning and cause daytime sleepiness and impairment of cognitive function, memory loss, mood disturbance, and problems focussing. Using CPAP regularly can improve these symptoms. Your CPAP usage has improved. Keep up the good work. We will keep your stimulants the same.  FU in 6 months.

## 2018-09-04 ENCOUNTER — Ambulatory Visit (INDEPENDENT_AMBULATORY_CARE_PROVIDER_SITE_OTHER): Payer: 59 | Admitting: Psychiatry

## 2018-09-04 ENCOUNTER — Encounter (HOSPITAL_COMMUNITY): Payer: Self-pay | Admitting: Psychiatry

## 2018-09-04 DIAGNOSIS — F331 Major depressive disorder, recurrent, moderate: Secondary | ICD-10-CM

## 2018-09-04 DIAGNOSIS — F419 Anxiety disorder, unspecified: Secondary | ICD-10-CM | POA: Diagnosis not present

## 2018-09-05 NOTE — Progress Notes (Signed)
Comprehensive Clinical Assessment (CCA) Note  09/05/2018 [name] [name] [id number]  Visit Diagnosis:      ICD-10-CM   1. Moderate episode of recurrent major depressive disorder (HCC) F33.1   2. Anxiety disorder, unspecified type F41.9       CCA Part One  Part One has been completed on paper by the patient.  (See scanned document in Chart Review)  CCA Part Two A  Intake/Chief Complaint:  CCA Intake With Chief Complaint CCA Part Two Date: 09/04/18 CCA Part Two Time: 1412 Chief Complaint/Presenting Problem: "Health and work, unable to remember or recall common things" "Memory problems, unable to stay focused, trouble falling asleep and staying asleep, low energy" Patients Currently Reported Symptoms/Problems: Noticed sleeping more and not enjoying things that she's use. Things that use to be second nature at work, find it more difficult. Can't stay focused on tasks. Collateral Involvement: Merleen Nicely, Other Specialists Individual's Strengths: Detail oriented, organized, spreadsheets, into data, reading, knitting drawing, making jewelry, -hard to do many of those things now.  Individual's Preferences: "Individual or Groups are fine" Individual's Abilities: Can drive, communicate needs, coordinate healthcare, reach out to support system Type of Services Patient Feels Are Needed: Individual Therapy and Medication management, may be interested in groups if not back to work.  Initial Clinical Notes/Concerns: Reading comprehension and memory problems, doesn't understand why she's severly depressed,   Mental Health Symptoms Depression:  Depression: Change in energy/activity, Difficulty Concentrating, Fatigue, Hopelessness, Irritability, Sleep (too much or little), Tearfulness  Mania:  Mania: Change in energy/activity, Irritability, Racing thoughts  Anxiety:   Anxiety: Difficulty concentrating, Fatigue, Irritability, Restlessness, Sleep, Worrying  Psychosis:   Psychosis: N/A  Trauma:  Trauma: N/A  Obsessions:  Obsessions: N/A  Compulsions:  Compulsions: N/A  Inattention:  Inattention: N/A  Hyperactivity/Impulsivity:  Hyperactivity/Impulsivity: N/A  Oppositional/Defiant Behaviors:  Oppositional/Defiant Behaviors: N/A  Borderline Personality:  Emotional Irregularity: Chronic feelings of emptiness, Unstable self-image, N/A  Other Mood/Personality Symptoms:  Other Mood/Personality Symtpoms: "I don't enjoy my hobbies, am unconfortable in large crowds, I'm sleeping and taking naps more."   Mental Status Exam Appearance and self-care  Stature:  Stature: Tall  Weight:  Weight: Thin  Clothing:  Clothing: Casual  Grooming:  Grooming: Normal  Cosmetic use:  Cosmetic Use: None  Posture/gait:  Posture/Gait: Normal  Motor activity:  Motor Activity: Not Remarkable  Sensorium  Attention:  Attention: Normal  Concentration:  Concentration: Normal  Orientation:  Orientation: X5  Recall/memory:  Recall/Memory: Normal  Affect and Mood  Affect:  Affect: Anxious, Appropriate, Depressed  Mood:  Mood: Anxious, Depressed  Relating  Eye contact:  Eye Contact: Normal  Facial expression:  Facial Expression: Depressed, Anxious, Responsive  Attitude toward examiner:  Attitude Toward Examiner: Cooperative  Thought and Language  Speech flow: Speech Flow: Normal  Thought content:  Thought Content: Appropriate to mood and circumstances  Preoccupation:  Preoccupations: Somatic  Hallucinations:     Organization:     Company secretary of Knowledge:     Intelligence:  Intelligence: Average  Abstraction:  Abstraction: Normal  Judgement:  Judgement: Normal  Reality Testing:  Reality Testing: Realistic  Insight:  Insight: Good  Decision Making:  Decision Making: Normal  Social Functioning  Social Maturity:  Social Maturity: Responsible  Social Judgement:  Social Judgement: Normal  Stress  Stressors:  Stressors: Grief/losses, Housing, Money, Transitions,  Work  Coping Ability:     Skill Deficits:     Supports:      Family and Psychosocial  History: Family history Marital status: Divorced Additional relationship information: Long Personnel officer relationship-very supportive Does patient have children?: No  Childhood History:  Childhood History By whom was/is the patient raised?: Both parents Additional childhood history information: Parents divorced Description of patient's relationship with caregiver when they were a child: Grew up with both parents Patient's description of current relationship with people who raised him/her: Dad Died in 11-10-1998, sees Mom 2xs a month Does patient have siblings?: Yes Number of Siblings: 3 Description of patient's current relationship with siblings: Relationship is fine-all live out of town Did patient suffer any verbal/emotional/physical/sexual abuse as a child?: No Did patient suffer from severe childhood neglect?: No Has patient ever been sexually abused/assaulted/raped as an adolescent or adult?: No Was the patient ever a victim of a crime or a disaster?: No Witnessed domestic violence?: No Has patient been effected by domestic violence as an adult?: No  CCA Part Two B  Employment/Work Situation: Employment / Work Psychologist, occupational Employment situation: Employed Where is patient currently employed?: [employer] How long has patient been employed?: 3 years Patient's job has been impacted by current illness: Yes Describe how patient's job has been impacted: Memory issues, time out of work, job is in limbo due to time missed from work, has burnout or compassion fatigue  Education: Education Last Grade Completed: 12 Did Garment/textile technologist From [employer]?: Yes Did Theme park manager?: Yes What Type of College Degree Do you Have?: Working on [employer] Degree What Was Your Major?: Data Did You Have Any Special Interests In School?: Math Did You Have An Individualized Education Program (IIEP): No Did You Have  Any Difficulty At School?: No  Religion: Religion/Spirituality Are You A Religious Person?: Yes What is Your Religious Affiliation?: Christian How Might This Affect Treatment?: "it shouldn't"  Leisure/Recreation: Leisure / Recreation Leisure and Hobbies: "I use to to enjoy making jewelry, knitting, reading, spending time with friends."  Exercise/Diet: Exercise/Diet Do You Exercise?: No Do You Follow a Special Diet?: Yes Type of Diet: Diabetic and High Blood Pressure Do You Have Any Trouble Sleeping?: Yes Explanation of Sleeping Difficulties: Not getting enough sleep, in pain, pain medications cause   CCA Part Two C  Alcohol/Drug Use: Alcohol / Drug Use Pain Medications: See Chart Prescriptions: See Chart Over the Counter: See Chart History of alcohol / drug use?: No history of alcohol / drug abuse                      CCA Part Three  ASAM's:  Six Dimensions of Multidimensional Assessment  Dimension 1:  Acute Intoxication and/or Withdrawal Potential:     Dimension 2:  Biomedical Conditions and Complications:     Dimension 3:  Emotional, Behavioral, or Cognitive Conditions and Complications:     Dimension 4:  Readiness to Change:     Dimension 5:  Relapse, Continued use, or Continued Problem Potential:     Dimension 6:  Recovery/Living Environment:      Substance use Disorder (SUD)    Social Function:  Social Functioning Social Maturity: Responsible Social Judgement: Normal  Stress:  Stress Stressors: Grief/losses, Housing, Arts administrator, Transitions, Work Patient Takes Medications The Way The Doctor Instructed?: Yes Priority Risk: Moderate Risk  Risk Assessment- Self-Harm Potential: Risk Assessment For Self-Harm Potential Thoughts of Self-Harm: No current thoughts Method: No plan Availability of Means: No access/NA  Risk Assessment -Dangerous to Others Potential: Risk Assessment For Dangerous to Others Potential Method: No Plan Availability of Means: No  access  or NA Intent: Vague intent or NA Notification Required: No need or identified person  DSM5 Diagnoses: Patient Active Problem List   Diagnosis Date Noted  . Confusion 11/28/2017  . Dizziness 11/28/2017  . Insulin pump in place 09/22/2017  . Cellulitis 09/21/2017  . Diabetic ketoacidosis without coma associated with diabetes mellitus due to underlying condition (HCC) 09/21/2017  . AKI (acute kidney injury) (HCC) 09/20/2017  . Hyperglycemia 09/20/2017  . Hypotension 09/20/2017  . Sepsis (HCC) 09/20/2017  . Intermittent confusion 04/18/2017  . OSA (obstructive sleep apnea) 04/18/2017  . HYPOTHYROIDISM 09/21/2007  . DIABETES MELLITUS, TYPE I 09/21/2007  . ANXIETY DEPRESSION 09/21/2007  . NAUSEA 09/21/2007    Patient Centered Plan: Patient is on the following Treatment Plan(s):  Anxiety and Depression  Recommendations for Services/Supports/Treatments: Recommendations for Services/Supports/Treatments Recommendations For Services/Supports/Treatments: IOP (Intensive Outpatient Program), Individual Therapy, Medication Management  Treatment Plan Summary: OP Treatment Plan Summary: To feel better about herself, to believe more in myself and to not accept the norm or reject in order to get life back on track.   Referrals to Alternative Service(s): Referred to Alternative Service(s):   Place:   Date:   Time:    Referred to Alternative Service(s):   Place:   Date:   Time:    Referred to Alternative Service(s):   Place:   Date:   Time:    Referred to Alternative Service(s):   Place:   Date:   Time:     Hilbert Odor, LCSW

## 2018-09-13 ENCOUNTER — Ambulatory Visit (HOSPITAL_COMMUNITY): Payer: 59 | Admitting: Psychiatry

## 2018-09-18 ENCOUNTER — Ambulatory Visit (INDEPENDENT_AMBULATORY_CARE_PROVIDER_SITE_OTHER): Payer: 59 | Admitting: Psychiatry

## 2018-09-18 ENCOUNTER — Other Ambulatory Visit: Payer: Self-pay

## 2018-09-18 ENCOUNTER — Encounter (HOSPITAL_COMMUNITY): Payer: Self-pay | Admitting: Psychiatry

## 2018-09-18 DIAGNOSIS — F419 Anxiety disorder, unspecified: Secondary | ICD-10-CM | POA: Diagnosis not present

## 2018-09-18 DIAGNOSIS — F331 Major depressive disorder, recurrent, moderate: Secondary | ICD-10-CM | POA: Diagnosis not present

## 2018-09-18 NOTE — Progress Notes (Signed)
Client: [name] "[address]" [name]  Date: 09/18/18  Time: 2:31-3:26  Type of Therapy: Individual Therapy via Webex  Axis I/II Diagnosis:? Major Depressive Disorder, recurrent moderate, Anxiety Disorder  Treatment goals addressed: To feel better about herself, to believe more in myself and to not accept the norm or reject in order to get life back on track.  Interventions: CBT, Motivational Interviewing, Psychoeducation, Coping Skill Building  Summary: [name], [place] female who presents with MDD and Anxiety, due to medical and work complications. Counselor using therapeutic interventions to address anxiety and depression and to prepare for returning to work full-time.  Therapist Response: [name] met with Counselor for individual therapy. Counselor joined with [employer] as she shared about current health status, work status, mental health concerns, coping skills and communication skills. Counselor assessed current psychiatric symptoms and life stressors with [facility]. [name] [name] was more optimistic and hopeful today about her situation, as she was cleared to start back to work part-time from home. [name] expressed anxieties about increasing to full-time duties. Counselor encouraged forming healthy self-care habits and boundaries to decrease stress and manage mental health and physical health. Counselor processed ways to communicate assertively with employer to advocate for her needs. Counselor provided psychoeducation on grief and loss cycle, focusing on Anger vs Depression vs Acceptance. Counselor validated Timothy Lasso feelings and concern, challenging negative self-cognitions. [address] [name] [name] reported taking many notes during session to have different strategies to impact her current situation in a healthier way. We plan to meet again via Webex, due to her compromised immune system.  Suicidal/Homicidal: No current safety concerns. No plan/intent to harm self or others.  Plan: To return in 2 weeks. Will apply skills  learned in session at home, until next session.  ?  Lise Auer, LCSW

## 2018-09-20 ENCOUNTER — Ambulatory Visit (HOSPITAL_COMMUNITY): Payer: 59 | Admitting: Psychiatry

## 2018-09-26 ENCOUNTER — Ambulatory Visit (INDEPENDENT_AMBULATORY_CARE_PROVIDER_SITE_OTHER): Payer: 59 | Admitting: Psychiatry

## 2018-09-26 ENCOUNTER — Other Ambulatory Visit: Payer: Self-pay

## 2018-09-26 DIAGNOSIS — F419 Anxiety disorder, unspecified: Secondary | ICD-10-CM

## 2018-09-26 DIAGNOSIS — F331 Major depressive disorder, recurrent, moderate: Secondary | ICD-10-CM | POA: Diagnosis not present

## 2018-09-26 MED ORDER — LAMOTRIGINE 25 MG PO TABS: ORAL_TABLET | ORAL | 1 refills | Status: DC

## 2018-09-26 NOTE — Progress Notes (Signed)
Virtual Visit via Video Note  I connected with [name] [name] on 09/26/18 at  1:00 PM EDT by a video enabled telemedicine application and verified that I am speaking with the correct person using two identifiers.   I discussed the limitations of evaluation and management by telemedicine and the availability of in person appointments. The patient expressed understanding and agreed to proceed.  History of Present Illness: [name] [name] is a 53 year old divorced employed female who was seen through video conference for her initial evaluation.  She is referred from her neurologist and primary care physician for the management of depression.  Patient reported that she has been struggling with depression for many years.  She is taking Wellbutrin 450 mg every day but she feels it seems like it is not working.  She continues to struggle with fatigue, lack of motivation, crying spells, depressive thoughts and social isolation.  She used to enjoy life but lately she noticed she is all the time tired and does not want to do anything.  She does not leave the house unless it is important.  She also mention episodes of confusion, memory impairment, difficulty remembering things and easily forgetful.  She admitted difficulty going to places if she does not have GPS.  She gets overwhelmed and she get anxiety but denies any panic attack.  She endorsed poor attention and concentration.  She is getting stimulants from her neurologist.  She had a sleep study and diagnosed with sleep apnea.  She admitted using CPAP machine but sometime mask does not fit very well.  She is sleeping okay when she uses the mask.  Patient has diabetes and she reported lately it is under control.  She is not happy due to pandemic coronavirus because she is not going outside.  Patient lives by herself.  She has a boyfriend who lives in [place].  Patient told relationship is more than for 5 years and is going well.  Patient has no children.  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  Patient denies drinking or using any illegal substances.  Patient denies any paranoia, hallucination, OCD symptoms, nightmares, flashback, psychosis or any mania.  She is taking Xanax 0.5 mg half to 1 tablet as needed twice a day, Adderall prescribed by neurologist to help her sleep apnea and memory impairment.,  Wellbutrin XL 450 mg to help her depression anxiety.  Patient has work-up with neuroimaging studies and she was told findings are not consistent with Alzheimer.  Patient recall history of TIA and meningitis in the past and she is following neurology regularly.  Patient denies drinking or using any illegal substances.  She is wondering if something else she can try as current medicine is not working.  She is also seeing Bethany in our office for therapy.  Her appetite is okay.  Past psychiatric history. No history of psychiatric inpatient treatment or any suicidal attempt.  No history of psychosis or mania.  No history of abuse.  Taking Wellbutrin prescribed by PCP.  Dose increased to 450 few years ago.  Medical history. History of TIA in 2018, history of meningitis, history of intermittent confusion, history of diabetes.  Drug and alcohol history. Patient denies any history of drugs or alcohol use.  Psychosocial history. Patient born and raised in [place].  She married once but marriage ended after 7 years.  Patient has no children.  Most of her family member lives in [place].  Patient is in a  steady relationship with her boyfriend who lives in [place].  Patient lives by herself.  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   No results found for this or any previous visit (from the past 2160 hour(s)).    Current Outpatient Medications:  .  ALPRAZolam (XANAX) 0.5 MG tablet, Take 0.25 mg by mouth as needed for anxiety. , Disp: , Rfl:  .  amphetamine-dextroamphetamine (ADDERALL XR) 25 MG 24 hr capsule, Take 1  capsule by mouth 2 (two) times daily., Disp: 60 capsule, Rfl: 0 .  amphetamine-dextroamphetamine (ADDERALL) 10 MG tablet, Take 1 tablet (10 mg total) by mouth 2 (two) times daily., Disp: 60 tablet, Rfl: 0 .  Armodafinil 150 MG tablet, TAKE ONE TABLET BY MOUTH DAILY, Disp: 30 tablet, Rfl: 5 .  BAYER CONTOUR NEXT TEST test strip, , Disp: , Rfl:  .  buPROPion (WELLBUTRIN SR) 150 MG 12 hr tablet, Take 2 tablets by mouth daily. , Disp: , Rfl:  .  estrogens, conjugated, (PREMARIN) 0.625 MG tablet, Take 1 tablet (0.625 mg total) by mouth daily., Disp: 90 tablet, Rfl: 3 .  gabapentin (NEURONTIN) 100 MG capsule, Take 100 mg by mouth., Disp: , Rfl:  .  Insulin Human (INSULIN PUMP) SOLN, Inject 25 each into the skin as directed. Throughout the day (Per BS), Disp: , Rfl:  .  insulin lispro (HUMALOG) 100 UNIT/ML injection, Inject 25 Units into the skin as needed for high blood sugar. , Disp: , Rfl:  .  levothyroxine (SYNTHROID, LEVOTHROID) 137 MCG tablet, Take 137 mcg by mouth daily., Disp: , Rfl:  .  methocarbamol (ROBAXIN) 500 MG tablet, Take 500 mg by mouth., Disp: , Rfl:  .  Prucalopride Succinate (MOTEGRITY) 2 MG TABS, Take 2 mg by mouth daily. , Disp: , Rfl:  .  Vitamin D, Ergocalciferol, (DRISDOL) 50000 units CAPS capsule, Take 50,000 Units by mouth every 7 (seven) days. , Disp: , Rfl:  .  XIIDRA 5 % SOLN, Apply to eye 2 (two) times daily., Disp: , Rfl:    Mental status examination: Limited mental stat examination done on video call.  Patient appears to be in her stated age.  She is pleasant and cooperative.  Her speech is clear, coherent and relevant.  She has some difficulty recalling details.  She describes her mood anxious and her affect is appropriate.  She denies any auditory or visual hallucination.  She denies any suicidal thoughts or homicidal thought.  There were no delusions, paranoia or any obsessive thoughts noted.  Her attention concentration gets distracted sometimes.  She is alert and  oriented x3.  Her fund of knowledge is good.  She did not reported any tremors or shakes.  Her cognition is grossly intact.  Her insight judgment and impulse control is okay.  Assessment and Plan: Patient is 53 year old female employed, single who suffers from depression anxiety symptoms.  She does not feel current Wellbutrin 450 mg working very well.  She is experiencing anxiety, depression, lack of motivation.  We discussed her comorbidity which includes memory impairment, poor attention concentration, sleep apnea and diabetes.  She is taking stimulants, benzodiazepine and antidepressant from her other providers.  I recommended to try Lamictal 25 mg daily for 1 week and then 50 mg daily as an add-on to help depression with Wellbutrin.  However I also recommend to reduce Wellbutrin to 300 mg daily as higher dose may be causing increased anxiety.  I encouraged to keep appointment with [facility] for [facility].  I review  her blood work results on current medication.  Discussed medication side effect specially if the Lamictal caused rash then she need to stop the medication immediately.  The patient do not see any improvement we will consider gene site testing.  Discussed safety concerns at any time having active suicidal thoughts or homicidal thought and she need to call 911 or go to local emergency room.  Follow-up in 6 weeks.  Follow Up Instructions:    I discussed the assessment and treatment plan with the patient. The patient was provided an opportunity to ask questions and all were answered. The patient agreed with the plan and demonstrated an understanding of the instructions.   The patient was advised to call back or seek an in-person evaluation if the symptoms worsen or if the condition fails to improve as anticipated.  I provided 50 minutes of non-face-to-face time during this encounter.   Cleotis Nipper, MD

## 2018-09-27 ENCOUNTER — Ambulatory Visit (INDEPENDENT_AMBULATORY_CARE_PROVIDER_SITE_OTHER): Payer: 59 | Admitting: Psychiatry

## 2018-09-27 ENCOUNTER — Encounter (HOSPITAL_COMMUNITY): Payer: Self-pay | Admitting: Psychiatry

## 2018-09-27 ENCOUNTER — Other Ambulatory Visit: Payer: Self-pay

## 2018-09-27 DIAGNOSIS — F331 Major depressive disorder, recurrent, moderate: Secondary | ICD-10-CM | POA: Diagnosis not present

## 2018-09-27 DIAGNOSIS — F419 Anxiety disorder, unspecified: Secondary | ICD-10-CM | POA: Diagnosis not present

## 2018-09-27 NOTE — Progress Notes (Signed)
Virtual Visit via Video Note  I connected with [name] [name] on 09/27/18 at  3:30 PM EDT by a video enabled telemedicine application and verified that I am speaking with the correct person using two identifiers.   I discussed the limitations of evaluation and management by telemedicine and the availability of in person appointments. The patient expressed understanding and agreed to proceed.  History of Present Illness: MDD and Anxiety Disorder due to medical complications and work environment.    Observations/Objective: Counselor met with [name] [name] via [place] for individual therapy. Counselor assessed progress on treatment goals since last session. Counselor processed and gave feedback on coping strategies implemented since our last session. Counselor praised [place] for her efforts in making small adjustments to her routine and communication style to better advocate for her needs. Counselor explored connection with her support system and encouraged interactions with healthy supports. Counselor and [employer] discussed mental health vs physical health symptoms and concerns, processing how to communicate needs with medical professionals. Counselor promoted self-care, safety and continued implementation of coping strategies.   Assessment and Plan: [name] [name] would like to continue meeting via Webex, due to compromised immune system. Counselor will send Webex invites for future sessions.   Follow Up Instructions: [name] [name] will take medications as prescribed and attend upcoming medical appointments. [facility] will use coping strategies until next session.    I discussed the assessment and treatment plan with the patient. The patient was provided an opportunity to ask questions and all were answered. The patient agreed with the plan and demonstrated an understanding of the instructions.   The patient was advised to call back or seek an in-person evaluation if the symptoms worsen or if the condition fails to  improve as anticipated.  I provided 40 minutes of non-face-to-face time during this encounter.   Lise Auer, LCSW

## 2018-10-10 ENCOUNTER — Other Ambulatory Visit: Payer: Self-pay | Admitting: Neurology

## 2018-10-10 DIAGNOSIS — G4733 Obstructive sleep apnea (adult) (pediatric): Secondary | ICD-10-CM

## 2018-10-10 DIAGNOSIS — G4711 Idiopathic hypersomnia with long sleep time: Secondary | ICD-10-CM

## 2018-10-10 DIAGNOSIS — Z9989 Dependence on other enabling machines and devices: Secondary | ICD-10-CM

## 2018-10-10 MED ORDER — AMPHETAMINE-DEXTROAMPHETAMINE 10 MG PO TABS: 10.0000 mg | ORAL_TABLET | Freq: Two times a day (BID) | ORAL | 0 refills | Status: DC

## 2018-10-10 MED ORDER — AMPHETAMINE-DEXTROAMPHET ER 25 MG PO CP24: 25.0000 mg | ORAL_CAPSULE | Freq: Two times a day (BID) | ORAL | 0 refills | Status: DC

## 2018-10-10 NOTE — Telephone Encounter (Signed)
Pt is due for a refill on her adderall. She is up to date on her appts. [place] Drug Registry checked.

## 2018-10-10 NOTE — Addendum Note (Signed)
Addended by: Geronimo Running A on: 10/10/2018 10:58 AM   Modules accepted: Orders

## 2018-10-10 NOTE — Telephone Encounter (Signed)
Pt has called for a refill on her hetamine-dextroamphetamine (ADDERALL XR) 25 MG 24 hr capsule   amphetamine-dextroamphetamine (ADDERALL) 10 MG tablet\  [facility] [address] - , [place] - [address]

## 2018-10-11 ENCOUNTER — Encounter (HOSPITAL_COMMUNITY): Payer: Self-pay | Admitting: Psychiatry

## 2018-10-11 ENCOUNTER — Other Ambulatory Visit: Payer: Self-pay

## 2018-10-11 ENCOUNTER — Ambulatory Visit (INDEPENDENT_AMBULATORY_CARE_PROVIDER_SITE_OTHER): Payer: 59 | Admitting: Psychiatry

## 2018-10-11 DIAGNOSIS — F419 Anxiety disorder, unspecified: Secondary | ICD-10-CM | POA: Diagnosis not present

## 2018-10-11 DIAGNOSIS — F331 Major depressive disorder, recurrent, moderate: Secondary | ICD-10-CM

## 2018-10-11 NOTE — Progress Notes (Signed)
Virtual Visit via Video Note  I connected with Jackquline Berlin on 10/11/18 at  3:30 PM EDT by a video enabled telemedicine application and verified that I am speaking with the correct person using two identifiers.   I discussed the limitations of evaluation and management by telemedicine and the availability of in person appointments. The patient expressed understanding and agreed to proceed.  History of Present Illness: MDD and Anxiety Disorder due to medical conditions and work related stress.    Observations/Objective: Counselor met with [name] via [place] for individual therapy. Counselor assessed progress on treatment plan goals and mental health symptoms. [place] shared that since returning to work part-time she has been managing her anxiety much better through effective communication and boundary setting. Counselor praised [place] for the [employer] she has completed and her ability to advocate for herself. We reviewed helpful strategies. Counselor assessed support system concerns. Counselor summarized session and encouraged continued implementation of skills learned in session and following doctors orders.   Assessment and Plan: Counselor and [facility] will follow up in two weeks via Webex.   Follow Up Instructions: Counselor will set up Webex for next session.    I discussed the assessment and treatment plan with the patient. The patient was provided an opportunity to ask questions and all were answered. The patient agreed with the plan and demonstrated an understanding of the instructions.   The patient was advised to call back or seek an in-person evaluation if the symptoms worsen or if the condition fails to improve as anticipated.  I provided 53 minutes of non-face-to-face time during this encounter.   Lise Auer, LCSW

## 2018-10-25 ENCOUNTER — Encounter (HOSPITAL_COMMUNITY): Payer: Self-pay | Admitting: Psychiatry

## 2018-10-25 ENCOUNTER — Other Ambulatory Visit: Payer: Self-pay

## 2018-10-25 ENCOUNTER — Ambulatory Visit (INDEPENDENT_AMBULATORY_CARE_PROVIDER_SITE_OTHER): Payer: 59 | Admitting: Psychiatry

## 2018-10-25 DIAGNOSIS — F419 Anxiety disorder, unspecified: Secondary | ICD-10-CM | POA: Diagnosis not present

## 2018-10-25 DIAGNOSIS — F331 Major depressive disorder, recurrent, moderate: Secondary | ICD-10-CM

## 2018-10-25 NOTE — Progress Notes (Signed)
Virtual Visit via Video Note  I connected with [name] [name] on 10/25/18 at  3:30 PM EDT by a video enabled telemedicine application and verified that I am speaking with the correct person using two identifiers.  Location: Patient: [name] [name] Provider: Lise Auer   I discussed the limitations of evaluation and management by telemedicine and the availability of in person appointments. The patient expressed understanding and agreed to proceed.  History of Present Illness: MDD and Anxiety due to medical condition, work related stress, and memory issues.   Observations/Objective: Counselor met with [name] [name] via [place] for individual therapy. Counselor assessed MH symptoms. [facility] shared that she has been experiencing more memory concerns, depression and anxiety related to her medical condition. Counselor provided CBT interventions to address depression and anxiety. Counselor shared psychoeducation on grief and loss cycle related to her health. Counselor processed strategies for her to implement to combat depressive and anxious feelings in a healthy way. Counselor encouraged [name] [name] to advocate for herself with her doctors and their lack of care for her conditions to ensure she can be safe and healthy, especially living alone. Counselor explore support system communication to ensure safety as well. Counselor assigned therapy homework and [facility] thanked Counselor for information and support.   Assessment and Plan: Counselor will continue meeting with [name] [name] to address treatment plan goals. [name] [name] will complete therapy homework and communicate needs with medical staff.   Follow Up Instructions: Counselor will set up Webex call for next meeting.    I discussed the assessment and treatment plan with the patient. The patient was provided an opportunity to ask questions and all were answered. The patient agreed with the plan and demonstrated an understanding of the instructions.   The  patient was advised to call back or seek an in-person evaluation if the symptoms worsen or if the condition fails to improve as anticipated.  I provided 53 minutes of non-face-to-face time during this encounter.   Lise Auer, LCSW

## 2018-10-30 ENCOUNTER — Telehealth: Payer: Self-pay | Admitting: Neurology

## 2018-10-30 NOTE — Telephone Encounter (Signed)
I called pt. I advised her that adderall 10mg  was refilled on 10/10/18. Pt should contact her pharmacy. Pt verbalized understanding and appreciation.

## 2018-10-30 NOTE — Telephone Encounter (Signed)
Pt is needing a refill on her amphetamine-dextroamphetamine (ADDERALL) 10 MG tablet and sent to [facility] on [address]

## 2018-11-03 ENCOUNTER — Telehealth (HOSPITAL_COMMUNITY): Payer: Self-pay

## 2018-11-03 NOTE — Telephone Encounter (Signed)
She should stop Lamictal until rash resolved.

## 2018-11-03 NOTE — Telephone Encounter (Signed)
Patient called to report a rash on her hands, patient has been taking Lamictal for about 3 weeks now and has not changed the dose. I advised patient to stop the medication until I spoke with the doctor and called her back. Patient was agreeable with this.

## 2018-11-07 ENCOUNTER — Ambulatory Visit (INDEPENDENT_AMBULATORY_CARE_PROVIDER_SITE_OTHER): Payer: 59 | Admitting: Psychiatry

## 2018-11-07 ENCOUNTER — Encounter (HOSPITAL_COMMUNITY): Payer: Self-pay | Admitting: Psychiatry

## 2018-11-07 ENCOUNTER — Other Ambulatory Visit: Payer: Self-pay

## 2018-11-07 DIAGNOSIS — F331 Major depressive disorder, recurrent, moderate: Secondary | ICD-10-CM | POA: Diagnosis not present

## 2018-11-07 DIAGNOSIS — F419 Anxiety disorder, unspecified: Secondary | ICD-10-CM | POA: Diagnosis not present

## 2018-11-07 MED ORDER — DULOXETINE HCL 20 MG PO CPEP: ORAL_CAPSULE | ORAL | 0 refills | Status: DC

## 2018-11-07 MED ORDER — BUPROPION HCL ER (XL) 300 MG PO TB24: 300.0000 mg | ORAL_TABLET | ORAL | 0 refills | Status: DC

## 2018-11-07 NOTE — Progress Notes (Signed)
Virtual Visit via Telephone Note  I connected with [name] [name] on 11/07/18 at  1:00 PM EDT by telephone and verified that I am speaking with the correct person using two identifiers.   I discussed the limitations, risks, security and privacy concerns of performing an evaluation and management service by telephone and the availability of in person appointments. I also discussed with the patient that there may be a patient responsible charge related to this service. The patient expressed understanding and agreed to proceed.   History of Present Illness: Patient evaluated by phone.  She is a 53 year old female who was seen first time 6 weeks ago.  She was referred from neurology as she was going through anxiety and depression.  She was taking Wellbutrin 450 mg but continued to express lack of motivation, anxiety, depression, fatigue, crying spells and social isolation.  She also have history of TIA with episodes of memory impairment, difficulty remembering things and easily forgetful.  She was also diagnosed with sleep apnea.  Her neurologist is given her moderate dose of stimulants and muscle relaxant for chronic pain.  She is getting Flexeril, gabapentin for her pinched nerve.  She is on a insulin pump for her diabetes.  We have recommended to decrease Wellbutrin to 300 and try Lamictal.  However after starting for few days she started to have rash in her hand and she was told to stop.  Patient is feeling better as her rash is slowly and gradually resolving.  However she still struggle with depression, anxiety and insomnia.  She only sleeping 3 to 6 hours.  She feels sometimes hopeless but denies any suicidal thoughts.  Patient told her faith is very strong and she will not do anything to harm herself.  She also taking Xanax as needed which helps her anxiety but does not help the sleep.  Patient has a steady boyfriend who lives in [place] and works for [employer].  She concerned about him as a travel to the  places where COVID-19 is present.  Patient told she does not want to leave the house unless it is important and she gets very anxious and has panic attacks when she goes to the public.  Patient lives by herself.  She has no children.  Patient denies drinking or using any illegal substances.  She denies any paranoia, hallucination, mood swing or any highs or lows.  She is seeing Bethany in our office for therapy.  Past psychiatric history; No history of psychiatric inpatient treatment or any suicidal attempt.  No history of psychosis or mania.  No history of abuse.  Taking Wellbutrin prescribed by PCP.  Dose increased to 450 milligrams few years ago.  We tried Lamictal but she developed rash.   Observations/Objective: Mental status attention done on the phone.  Patient described her mood sad and anxious.  Her speech is slow but coherent.  Her thought process logical and goal-directed.  Her attention concentration is fair and sometimes she has difficulty remembering things.  She denies any auditory or visual hallucination.  She denies any active or passive suicidal thoughts or homicidal thought.  There were no delusions or paranoia.  She is alert and oriented x3.  Her fund of knowledge is okay.  Her cognition is fair.  She do not report any tremors but endorse rash which is resolving.  Her insight and judgment is okay.  Assessment and Plan: Major depressive disorder, recurrent.  Generalized anxiety disorder.  Discontinue Lamictal due to rash.  Discussed insomnia.  Reminded that she is taking moderate dose of stimulant.  She is taking 50 mg of Adderall XR and 10 mg of instant release which can cause insomnia and racing thoughts.  I recommend to try reducing the stimulant to see if that can help insomnia.  Patient has a diabetes and we will defer adding any antipsychotic medication which can cause worsening of blood sugar.  I recommend to try Cymbalta which can also help her nerve pain.  She is taking  gabapentin but does not feel it is working.  I recommend to try Cymbalta 20 mg daily for 1 week and then twice a day.  Discussed medication side effects and benefits including in the beginning GI symptoms.  She is taking Wellbutrin SR 150 mg 2 tablet in the morning and I suggested that she should try Wellbutrin XL 300 mg to take 1 in the morning for better absorption.  She agreed with that.  She is getting muscle relaxant gabapentin and Xanax from her primary care physician.  I encouraged to continue therapy with Bethany.  Discussed medication side effects and benefits.  If patient do not see any improvement with the Cymbalta we will consider gene site testing.  Recommended to call us back if she has any question or any concern.  Discussed safety concern that anytime having active suicidal thoughts or homicidal thought then she need to call 911 or go to local emergency room.  Follow-up in 4 weeks.    Follow Up Instructions:    I discussed the assessment and treatment plan with the patient. The patient was provided an opportunity to ask questions and all were answered. The patient agreed with the plan and demonstrated an understanding of the instructions.   The patient was advised to call back or seek an in-person evaluation if the symptoms worsen or if the condition fails to improve as anticipated.  I provided 35 minutes of non-face-to-face time during this encounter.   Cleotis Nipper, MD

## 2018-11-08 ENCOUNTER — Ambulatory Visit (INDEPENDENT_AMBULATORY_CARE_PROVIDER_SITE_OTHER): Payer: 59 | Admitting: Psychiatry

## 2018-11-08 ENCOUNTER — Other Ambulatory Visit: Payer: Self-pay

## 2018-11-08 DIAGNOSIS — F331 Major depressive disorder, recurrent, moderate: Secondary | ICD-10-CM

## 2018-11-08 DIAGNOSIS — F419 Anxiety disorder, unspecified: Secondary | ICD-10-CM | POA: Diagnosis not present

## 2018-11-09 ENCOUNTER — Encounter (HOSPITAL_COMMUNITY): Payer: Self-pay | Admitting: Psychiatry

## 2018-11-09 NOTE — Progress Notes (Signed)
Virtual Visit via Video Note  I connected with [name] [name] on 11/09/18 at  3:30 PM EDT by a video enabled telemedicine application and verified that I am speaking with the correct person using two identifiers.  Location: Patient: [name] [name] Provider: Lise Auer, LCSW   I discussed the limitations of evaluation and management by telemedicine and the availability of in person appointments. The patient expressed understanding and agreed to proceed.  History of Present Illness: MDD and Anxiety disorder due to complex medical conditions and medical trauma.    Observations/Objective: Counselor met with [name] [name] for individual therapy via Webex. Counselor assessed MH symptoms and progress on treatment plan goals. [name] [name] denied suicidal ideation or self-harm behaviors. [facility] shared that she completed her therapy homework and was feeling more hopeful, in control and optimistic this week in getting her medical treatment better managed which in turn helps reduce her anxiety. [name] [name] reported utilizing her assertive communication skills both at work and doctors appointments. Counselor praised her and we discussed the positive outcomes. Counselor shared considerations in addressing her memory issues and function to be preventative in avoiding more memory problems. [facility] felt good about implementing strategies suggested in therapy.  Assessment and Plan: Counselor will continue to meet with [name] [name] to address treatment plan goals. [name] [name] will continue to follow recommendations of providers and implement skills learned in session.  Follow Up Instructions: Counselor will send information for next session via Webex.    I discussed the assessment and treatment plan with the patient. The patient was provided an opportunity to ask questions and all were answered. The patient agreed with the plan and demonstrated an understanding of the instructions.   The patient was advised to call back or seek  an in-person evaluation if the symptoms worsen or if the condition fails to improve as anticipated.  I provided 55 minutes of non-face-to-face time during this encounter.   Lise Auer, LCSW

## 2018-11-22 ENCOUNTER — Encounter (HOSPITAL_COMMUNITY): Payer: Self-pay | Admitting: Psychiatry

## 2018-11-22 ENCOUNTER — Ambulatory Visit (INDEPENDENT_AMBULATORY_CARE_PROVIDER_SITE_OTHER): Payer: 59 | Admitting: Psychiatry

## 2018-11-22 ENCOUNTER — Other Ambulatory Visit: Payer: Self-pay

## 2018-11-22 DIAGNOSIS — F331 Major depressive disorder, recurrent, moderate: Secondary | ICD-10-CM

## 2018-11-22 DIAGNOSIS — R413 Other amnesia: Secondary | ICD-10-CM

## 2018-11-22 DIAGNOSIS — F419 Anxiety disorder, unspecified: Secondary | ICD-10-CM

## 2018-11-22 NOTE — Progress Notes (Signed)
Virtual Visit via Video Note  I connected with [name] [name] on 11/22/18 at 12:30 PM EDT by a video enabled telemedicine application and verified that I am speaking with the correct person using two identifiers.  Location: Patient: [name] [name] Provider: Lise Auer, LCSW   I discussed the limitations of evaluation and management by telemedicine and the availability of in person appointments. The patient expressed understanding and agreed to proceed.  History of Present Illness: MDD and Anxiety due to medical condition, medical trauma, memory concerns and work related issues.    Observations/Objective: Counselor met with [name] [name] for individual therapy via Webex. Counselor assessed MH symptoms and progress on treatment plan goals. [name] [name] denied suicidal ideation or self-harm behaviors. [facility] shared that she continues to see positive outcomes in implementation of coping skills. Counselor explored her application of coping skills and we problem solved areas within relationships that she continues to get stuck in her unhelpful automatic thoughts. Counselor reviewed progress on other homework items, such as changing medical providers, work stress management, and memory improvement apps. Counselor praised [place] for her motivation, progress and engagement in treatment. Counselor and [facility] discussed safety considerations for returning to work full-time and how to communicate her needs.   Assessment and Plan: Counselor will continue to meet with [name] [name] to address treatment plan goals. [name] [name] will continue to follow recommendations of providers and implement skills learned in session.  Follow Up Instructions: Counselor will send information for next session via Webex.   I discussed the assessment and treatment plan with the patient. The patient was provided an opportunity to ask questions and all were answered. The patient agreed with the plan and demonstrated an understanding of the  instructions.   The patient was advised to call back or seek an in-person evaluation if the symptoms worsen or if the condition fails to improve as anticipated.  I provided 60 minutes of non-face-to-face time during this encounter.   Lise Auer, LCSW

## 2018-11-27 ENCOUNTER — Other Ambulatory Visit (HOSPITAL_COMMUNITY): Payer: Self-pay | Admitting: Psychiatry

## 2018-11-27 DIAGNOSIS — F419 Anxiety disorder, unspecified: Secondary | ICD-10-CM

## 2018-11-27 DIAGNOSIS — F331 Major depressive disorder, recurrent, moderate: Secondary | ICD-10-CM

## 2018-11-29 ENCOUNTER — Other Ambulatory Visit: Payer: Self-pay | Admitting: Neurology

## 2018-11-29 DIAGNOSIS — G4733 Obstructive sleep apnea (adult) (pediatric): Secondary | ICD-10-CM

## 2018-11-29 DIAGNOSIS — G4711 Idiopathic hypersomnia with long sleep time: Secondary | ICD-10-CM

## 2018-11-29 NOTE — Telephone Encounter (Signed)
Pt called in for a refill of amphetamine-dextroamphetamine (ADDERALL) 10 MG tablet to be sent to [facility] [address] [place] - [address]

## 2018-11-29 NOTE — Telephone Encounter (Signed)
Pt is due for a refill on adderall RXs and she is up to date with her appts. [place] Controlled Substance Registry checked.

## 2018-11-30 MED ORDER — AMPHETAMINE-DEXTROAMPHETAMINE 10 MG PO TABS: 10.0000 mg | ORAL_TABLET | Freq: Two times a day (BID) | ORAL | 0 refills | Status: DC

## 2018-11-30 MED ORDER — AMPHETAMINE-DEXTROAMPHET ER 25 MG PO CP24: 25.0000 mg | ORAL_CAPSULE | Freq: Two times a day (BID) | ORAL | 0 refills | Status: DC

## 2018-11-30 NOTE — Telephone Encounter (Signed)
PA for adderall XR 25mg  approved from [employer] until 11/30/2019.

## 2018-11-30 NOTE — Telephone Encounter (Signed)
Received PA request for pt's adderall XR 25mg . PA completed via covermymeds. Key: [id number] - PA Case ID: [id number] - Rx #: [id number]. Sent to [employer]. Should have a response within 1-3 business days.

## 2018-12-03 ENCOUNTER — Other Ambulatory Visit (HOSPITAL_COMMUNITY): Payer: Self-pay | Admitting: Psychiatry

## 2018-12-03 DIAGNOSIS — F419 Anxiety disorder, unspecified: Secondary | ICD-10-CM

## 2018-12-03 DIAGNOSIS — F331 Major depressive disorder, recurrent, moderate: Secondary | ICD-10-CM

## 2018-12-06 ENCOUNTER — Ambulatory Visit (HOSPITAL_COMMUNITY): Payer: 59 | Admitting: Psychiatry

## 2018-12-06 ENCOUNTER — Other Ambulatory Visit: Payer: Self-pay

## 2018-12-06 ENCOUNTER — Ambulatory Visit (INDEPENDENT_AMBULATORY_CARE_PROVIDER_SITE_OTHER): Payer: 59 | Admitting: Psychiatry

## 2018-12-06 ENCOUNTER — Encounter (HOSPITAL_COMMUNITY): Payer: Self-pay | Admitting: Psychiatry

## 2018-12-06 DIAGNOSIS — F419 Anxiety disorder, unspecified: Secondary | ICD-10-CM

## 2018-12-06 DIAGNOSIS — F331 Major depressive disorder, recurrent, moderate: Secondary | ICD-10-CM

## 2018-12-06 MED ORDER — BUPROPION HCL ER (XL) 300 MG PO TB24: 300.0000 mg | ORAL_TABLET | ORAL | 0 refills | Status: DC

## 2018-12-06 MED ORDER — ESZOPICLONE 2 MG PO TABS: 2.0000 mg | ORAL_TABLET | Freq: Every evening | ORAL | 1 refills | Status: DC | PRN

## 2018-12-06 MED ORDER — DULOXETINE HCL 30 MG PO CPEP: ORAL_CAPSULE | ORAL | 1 refills | Status: DC

## 2018-12-06 NOTE — Progress Notes (Signed)
Virtual Visit via Telephone Note  I connected with [name] [name] on 12/06/18 at  4:20 PM EDT by telephone and verified that I am speaking with the correct person using two identifiers.   I discussed the limitations, risks, security and privacy concerns of performing an evaluation and management service by telephone and the availability of in person appointments. I also discussed with the patient that there may be a patient responsible charge related to this service. The patient expressed understanding and agreed to proceed.   History of Present Illness: Patient was evaluated by phone session.  On her last visit we started her on Cymbalta and she is taking 20 mg twice a day.  She started 20 mg for 1 week and now she is taking 2 a day.  She reported no tremors, shakes or any EPS.  She is still struggle with chronic anxiety and depression.  Since she cut down the stimulant dose her sleep is better but she is still take sometimes 0.5 mg Xanax 3 times a week to get sleep.  She endorsed difficulty in attention and concentration.  Sometimes she has lack of motivation to do things.  She feels sometimes fatigue and difficulty remembering things.  She gets easily forgetful.  She has a history of TIA and memory impairment.  She denies any suicidal thoughts or homicidal thought.  She denies any crying spells or any feeling of hopelessness.  She like to give more time to Cymbalta.  Her appetite is okay.  [facility]  Patient lives by herself.   Past psychiatric history; No history of psychiatric inpatient treatment or any suicidal attempt.  No history of psychosis or mania.  No history of abuse.  Taking Wellbutrin prescribed by PCP.  Dose increased to 450 few years ago.  We tried Lamictal but caused rash.  Neurologist had prescribed Adderall to help her memory.   Psychiatric Specialty Exam: Physical Exam  ROS  Last menstrual period 06/28/2000.There is no height or weight on file to  calculate BMI.  General Appearance: NA  Eye Contact:  NA  Speech:  Clear and Coherent and Slow  Volume:  Normal  Mood:  Dysphoric  Affect:  NA  Thought Process:  Goal Directed  Orientation:  Full (Time, Place, and Person)  Thought Content:  Rumination  Suicidal Thoughts:  No  Homicidal Thoughts:  No  Memory:  Immediate;   Fair Recent;   Fair Remote;   Fair  Judgement:  Good  Insight:  Good  Psychomotor Activity:  NA  Concentration:  Concentration: Fair and Attention Span: Fair  Recall:  [employer]  Fund of Knowledge:  Good  Language:  Good  Akathisia:  No  Handed:  Right  AIMS (if indicated):     Assets:  Communication Skills Desire for Improvement Housing  ADL's:  Intact  Cognition:  WNL  Sleep:   poor      Assessment and Plan: Major depressive disorder, recurrent.  Generalized anxiety disorder.  Mild cognitive impairment.  She is no longer taking Lamictal due to rash.  Discussed insomnia.  She is still taking Adderall XR and instant release prescribed by neurology to help her memory.  So far she is tolerating Cymbalta without any side effects.  I recommend to try 30 mg twice a day if she has no side effects.  She will continue Wellbutrin XL 300 mg daily in the morning.  She like to try something to help her sleep.  I recommend to try  Lunesta which she has never tried before however I recommended that she should not take Xanax if she is taking [place].  Discussed medication side effects and benefits.  Encouraged to continue therapy with [facility].  She is taking insulin, gabapentin, Flexeril and Xanax from her primary care physician.  Discussed polypharmacy.  Recommended to call us back if she is any question or any concern.  Follow-up in 6 weeks.  Follow Up Instructions:    I discussed the assessment and treatment plan with the patient. The patient was provided an opportunity to ask questions and all were answered. The patient agreed with the plan and demonstrated an  understanding of the instructions.   The patient was advised to call back or seek an in-person evaluation if the symptoms worsen or if the condition fails to improve as anticipated.  I provided 20 minutes of non-face-to-face time during this encounter.   Kathlee Nations, MD

## 2018-12-20 ENCOUNTER — Ambulatory Visit (INDEPENDENT_AMBULATORY_CARE_PROVIDER_SITE_OTHER): Payer: 59 | Admitting: Psychiatry

## 2018-12-20 ENCOUNTER — Other Ambulatory Visit: Payer: Self-pay

## 2018-12-20 DIAGNOSIS — R413 Other amnesia: Secondary | ICD-10-CM

## 2018-12-20 DIAGNOSIS — F331 Major depressive disorder, recurrent, moderate: Secondary | ICD-10-CM | POA: Diagnosis not present

## 2018-12-20 DIAGNOSIS — F419 Anxiety disorder, unspecified: Secondary | ICD-10-CM | POA: Diagnosis not present

## 2018-12-20 NOTE — Progress Notes (Signed)
Virtual Visit via Video Note  I connected with [name] [name] on 12/20/18 at  3:00 PM EDT by a video enabled telemedicine application and verified that I am speaking with the correct person using two identifiers.  Location: Patient: [name] [name] Provider: Lise Auer, LCSW   I discussed the limitations of evaluation and management by telemedicine and the availability of in person appointments. The patient expressed understanding and agreed to proceed.  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   Observations/Objective: Counselor met with [name] [name] for individual therapy via Webex. Counselor assessed MH symptoms and progress on treatment plan goals. [name] [name] denied suicidal ideation or self-harm behaviors. [facility] shared that she has been actively advocating for herself more with medical professionals and in the work place. Counselor praised her for the confidence and self-compassion she is exhibiting. Counselor explored the impact these new behaviors are having in her life. Counselor and [facility] discussed her identity, worth, sefl-esteem and overall well being. Counselor assessed daily functioning and how she is applying coping skills into her daily routine. Counselor noted that she presented as much happier, carefree and confident today.   Assessment and Plan: Counselor will continue to meet with [name] [name] to address treatment plan goals. [name] [name] will continue to follow recommendations of providers and implement skills learned in session.  Follow Up Instructions: Counselor will send information for next session via Webex.     I discussed the assessment and treatment plan with the patient. The patient was provided an opportunity to ask questions and all were answered. The patient agreed with the plan and demonstrated an understanding of the instructions.   The patient was advised to call back or seek an in-person evaluation if the symptoms  worsen or if the condition fails to improve as anticipated.  I provided 55 minutes of non-face-to-face time during this encounter.   Lise Auer, LCSW

## 2018-12-22 ENCOUNTER — Encounter (HOSPITAL_COMMUNITY): Payer: Self-pay | Admitting: Psychiatry

## 2019-01-02 ENCOUNTER — Other Ambulatory Visit (HOSPITAL_COMMUNITY): Payer: Self-pay | Admitting: Psychiatry

## 2019-01-02 DIAGNOSIS — F331 Major depressive disorder, recurrent, moderate: Secondary | ICD-10-CM

## 2019-01-02 DIAGNOSIS — F419 Anxiety disorder, unspecified: Secondary | ICD-10-CM

## 2019-01-05 ENCOUNTER — Other Ambulatory Visit: Payer: Self-pay | Admitting: Obstetrics and Gynecology

## 2019-01-05 ENCOUNTER — Ambulatory Visit
Admission: RE | Admit: 2019-01-05 | Discharge: 2019-01-05 | Disposition: A | Discharge status: Home / Self Care | Payer: Managed Care, Other (non HMO) | Source: Ambulatory Visit | Attending: Obstetrics and Gynecology | Admitting: Obstetrics and Gynecology

## 2019-01-05 DIAGNOSIS — N6489 Other specified disorders of breast: Secondary | ICD-10-CM

## 2019-01-09 ENCOUNTER — Other Ambulatory Visit: Payer: Self-pay

## 2019-01-09 ENCOUNTER — Ambulatory Visit (INDEPENDENT_AMBULATORY_CARE_PROVIDER_SITE_OTHER): Payer: 59 | Admitting: Psychiatry

## 2019-01-09 DIAGNOSIS — F419 Anxiety disorder, unspecified: Secondary | ICD-10-CM

## 2019-01-09 DIAGNOSIS — F331 Major depressive disorder, recurrent, moderate: Secondary | ICD-10-CM

## 2019-01-10 ENCOUNTER — Encounter (HOSPITAL_COMMUNITY): Payer: Self-pay | Admitting: Psychiatry

## 2019-01-10 NOTE — Progress Notes (Signed)
Virtual Visit via Video Note  I connected with [name] [name] on 01/10/19 at  3:00 PM EDT by a video enabled telemedicine application and verified that I am speaking with the correct person using two identifiers.  Location: Patient: [name] [name] Provider: Lise Auer, LCSW   I discussed the limitations of evaluation and management by telemedicine and the availability of in person appointments. The patient expressed understanding and agreed to proceed.  History of Present Illness: MDD and Anxiety due to a medical condition.    Observations/Objective: Counselor met with [name] [name] for individual therapy via Webex. Counselor assessed MH symptoms and progress on treatment plan goals. [name] [name] denied suicidal ideation or self-harm behaviors. [facility] shared that her anxiety and depression symptoms are stable and not problematic at this time. Counselor praised [place] for her ability to leave her home for the first time since March. Counselor and [facility] explored her coping skills around this activity. Counselor and [facility] discussed continual issues with her pump doctor and how she is learning to use better communication skills and coping skills in better managing her diabetes. Counselor explored cultural and family history with [facility] as current events have been of concern for her. Counselor praised [place] for the progress she is making, how her moods and energy has improved. [address] shared a new hobby she is engaging in and how she wanted to help others in doing so.   Assessment and Plan: Counselor will continue to meet with [name] [name] to address treatment plan goals. [name] [name] will continue to follow recommendations of providers and implement skills learned in session.  Follow Up Instructions: Counselor will send information for next session via Webex.     I discussed the assessment and treatment plan with the patient. The patient was provided an opportunity to ask questions and all were answered.  The patient agreed with the plan and demonstrated an understanding of the instructions.   The patient was advised to call back or seek an in-person evaluation if the symptoms worsen or if the condition fails to improve as anticipated.  I provided 55 minutes of non-face-to-face time during this encounter.   Lise Auer, LCSW

## 2019-01-24 ENCOUNTER — Other Ambulatory Visit: Payer: Self-pay

## 2019-01-24 ENCOUNTER — Encounter (HOSPITAL_COMMUNITY): Payer: Self-pay | Admitting: Psychiatry

## 2019-01-24 ENCOUNTER — Ambulatory Visit (INDEPENDENT_AMBULATORY_CARE_PROVIDER_SITE_OTHER): Payer: 59 | Admitting: Psychiatry

## 2019-01-24 DIAGNOSIS — F331 Major depressive disorder, recurrent, moderate: Secondary | ICD-10-CM | POA: Diagnosis not present

## 2019-01-24 DIAGNOSIS — F419 Anxiety disorder, unspecified: Secondary | ICD-10-CM | POA: Diagnosis not present

## 2019-01-24 MED ORDER — BUPROPION HCL ER (XL) 300 MG PO TB24: 300.0000 mg | ORAL_TABLET | ORAL | 2 refills | Status: DC

## 2019-01-24 MED ORDER — DULOXETINE HCL 30 MG PO CPEP: ORAL_CAPSULE | ORAL | 2 refills | Status: DC

## 2019-01-24 MED ORDER — ESZOPICLONE 2 MG PO TABS: 2.0000 mg | ORAL_TABLET | Freq: Every evening | ORAL | 1 refills | Status: DC | PRN

## 2019-01-24 NOTE — Progress Notes (Signed)
Virtual Visit via Telephone Note  I connected with [name] [name] on 01/24/19 at  4:20 PM EDT by telephone and verified that I am speaking with the correct person using two identifiers.   I discussed the limitations, risks, security and privacy concerns of performing an evaluation and management service by telephone and the availability of in person appointments. I also discussed with the patient that there may be a patient responsible charge related to this service. The patient expressed understanding and agreed to proceed.   History of Present Illness: Patient was evaluated by phone session.  She has cut down her Adderall and only taking 10 mg twice a day.  She has noticed much improvement in her sleep.  She takes as needed Johnnye Sima which was recommended on her last visit.  She feels reducing the Cymbalta and Adderall had helped her a lot.  She also feel her attention and concentration is improved.  She is more active and motivated to do things.  She has upcoming appointment to see neurology.  She has a history of TIA and memory impairment.  Patient denies any crying spells, suicidal thoughts.  She is seeing [facility] for therapy.  She lives by herself.  She denies drinking or using any illegal substances. Past psychiatric history; No history of psychiatric inpatient treatment or any suicidal attempt. No history of psychosis or mania. No history of abuse. Taking Wellbutrin prescribed by PCP. Dose increased to 450 few years ago.  We tried Lamictal but caused rash.  Neurologist had prescribed Adderall to help her memory.   Psychiatric Specialty Exam: Physical Exam  ROS  Last menstrual period 06/28/2000.There is no height or weight on file to calculate BMI.  General Appearance: NA  Eye Contact:  NA  Speech:  Clear and Coherent and Slow  Volume:  Normal  Mood:  Anxious  Affect:  NA  Thought Process:  Goal Directed  Orientation:  Full (Time, Place, and Person)  Thought Content:  WDL  Suicidal  Thoughts:  No  Homicidal Thoughts:  No  Memory:  Immediate;   Fair Recent;   Fair Remote;   Fair  Judgement:  Good  Insight:  Fair  Psychomotor Activity:  NA  Concentration:  Concentration: Good and Attention Span: Fair  Recall:  Good  Fund of Knowledge:  Good  Language:  Good  Akathisia:  No  Handed:  Right  AIMS (if indicated):     Assets:  Communication Skills Desire for Improvement Housing Resilience  ADL's:  Intact  Cognition:  WNL  Sleep:         Assessment and Plan: Major depressive disorder, recurrent.  Generalized anxiety disorder.  Mild cognitive impairment.  Patient feeling better since she had cut down Adderall dose which is prescribed by neurology.  She also feeling better since Cymbalta dose here adjusted.  She is now taking 30 mg twice a day.  Discussed medication side effects and benefits she has no tremors or shakes.  Continue Cymbalta 30 mg twice a day, Wellbutrin XL 300 mg daily, Lunesta 2 mg as needed.  She has not taken Xanax in a while.  Encouraged to keep appointment with neurology.  She is getting Adderall from neurologist.  Encourage healthy lifestyle and watch her calorie intake.  Recommended to call us back if she is any question or any concern.  Continue therapy with [facility].  Follow-up in 3 months.  Follow Up Instructions:    I discussed the assessment and treatment plan with the patient. The patient  was provided an opportunity to ask questions and all were answered. The patient agreed with the plan and demonstrated an understanding of the instructions.   The patient was advised to call back or seek an in-person evaluation if the symptoms worsen or if the condition fails to improve as anticipated.  I provided 20 minutes of non-face-to-face time during this encounter.   Kathlee Nations, MD

## 2019-02-01 ENCOUNTER — Other Ambulatory Visit: Payer: Self-pay | Admitting: Neurology

## 2019-02-01 DIAGNOSIS — Z9989 Dependence on other enabling machines and devices: Secondary | ICD-10-CM

## 2019-02-01 DIAGNOSIS — G4711 Idiopathic hypersomnia with long sleep time: Secondary | ICD-10-CM

## 2019-02-01 DIAGNOSIS — G4733 Obstructive sleep apnea (adult) (pediatric): Secondary | ICD-10-CM

## 2019-02-01 MED ORDER — AMPHETAMINE-DEXTROAMPHETAMINE 10 MG PO TABS: 10.0000 mg | ORAL_TABLET | Freq: Two times a day (BID) | ORAL | 0 refills | Status: DC

## 2019-02-01 NOTE — Telephone Encounter (Signed)
Pt is due for a refill on adderall. Pt is up to date on her appts. [place] [facility] checked.

## 2019-02-01 NOTE — Addendum Note (Signed)
Addended by: [employer] A on: 02/01/2019 03:02 PM   Modules accepted: Orders

## 2019-02-01 NOTE — Telephone Encounter (Signed)
Pt has called for a refill on her amphetamine-dextroamphetamine (ADDERALL) 10 MG tablet  [facility] [address] - [place], [place] - [id number] 

## 2019-02-22 ENCOUNTER — Other Ambulatory Visit: Payer: Self-pay

## 2019-02-22 ENCOUNTER — Encounter: Payer: Self-pay | Admitting: Neurology

## 2019-02-22 ENCOUNTER — Ambulatory Visit: Payer: Managed Care, Other (non HMO) | Admitting: Neurology

## 2019-02-22 VITALS — BP 116/69 | HR 91 | Ht 64.0 in | Wt 136.0 lb

## 2019-02-22 DIAGNOSIS — Z9989 Dependence on other enabling machines and devices: Secondary | ICD-10-CM | POA: Diagnosis not present

## 2019-02-22 DIAGNOSIS — G4711 Idiopathic hypersomnia with long sleep time: Secondary | ICD-10-CM

## 2019-02-22 DIAGNOSIS — G4733 Obstructive sleep apnea (adult) (pediatric): Secondary | ICD-10-CM

## 2019-02-22 NOTE — Progress Notes (Signed)
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 anomia. Speech is clear with normal prosody and enunciation. Thought process is linear. Mood is normal and affect is anxious.  Cranial nerves are as described above under HEENT exam.  Motor exam: Normal bulk, strength and tone is noted. There is no tremor. Sensory exam is intact to light touchin the upper and lower extremities.  On cerebellar testing, heel-to-shin and finger-to-nose are unremarkable. Gait, station and balance are unremarkable. No veering to one side is noted. No leaning to one side is noted. Posture is age-appropriate and stance is narrow based. No problems turning are noted.   Assessment and Plan:   In summary, [name] [name] is a very pleasant13 year old femalewith an underlying history of hypothyroidism, DM type I, anxiety and depression, low back pain, and s/p neck surgery, who presents forfollow up of her sleep disorder, Including obstructive sleep apnea and hypersomnolence.  She has been quite consistent with her CPAP.  She does have to take a break from time to time because of nasal soreness. She is advised to try to use some skin soothing and healing cream during the day time such as Aquaphor. She is advised not to use it right before  sleep and not to use petroleum jelly around the nasal pillows.  She has had Some medication changes and has had close evaluation and follow-up with Psychiatry.  She is doing much better in that regard.  She has been able to come off of the long-acting Adderall.  In the past, she has seen a neuropsychologist. We did some workup for cognitive concerns and her workup was reassuring in that regard. She had an EEG in December 2018 which was reported as normal. She had neuropsychological evaluation in the past with Dr. Valentina Shaggy on 08/08/2014 with benign results at the time. She also had workup in 2018 for TIA concern. She saw Dr.Bailar-Heath. She had sleep study testing in the form of CPAP titration full night followed by a MSLT the next dayin the past. Her CPAP pressure was adequate pressure of 9 cm during the sleep study, but she had an abnormal next day nap test, in keeping with with idiopathic hypersomnolence.In thepast she had complained about memory issues. She had a head CT without contrast and MRI brain without contrast on 04/18/2017 with benign results. Of note, she alsohada normal brain MRI in July 2015. Her physical and neurological exam have been nonfocal. She can follow-up in 6 months, she is reminded to be compliant with CPAP therapy. I answered all her questions today and she was in agreement. I spent 20 minutes in total face-to-face time with the patient, more than 50% of which was spent in counseling and coordination of care, reviewing test results, reviewing medication and discussing or  reviewing the diagnosis of hypersomnia, OSA, the prognosis and treatment options. Pertinent laboratory and imaging test results that were available during this visit with the patient were reviewed by me and considered in my medical decision making (see chart for details).

## 2019-02-22 NOTE — Patient Instructions (Signed)
I am glad you are feeling well! It's good, that you can do without the adderall.  Please continue using your CPAP regularly. While your insurance requires that you use CPAP at least 4 hours each night on 70% of the nights, I recommend, that you not skip any nights and use it throughout the night if you can. Getting used to CPAP and staying with the treatment long term does take time and patience and discipline. Untreated obstructive sleep apnea when it is moderate to severe can have an adverse impact on cardiovascular health and raise her risk for heart disease, arrhythmias, hypertension, congestive heart failure, stroke and diabetes. Untreated obstructive sleep apnea causes sleep disruption, nonrestorative sleep, and sleep deprivation. This can have an impact on your day to day functioning and cause daytime sleepiness and impairment of cognitive function, memory loss, mood disturbance, and problems focussing. Using CPAP regularly can improve these symptoms.  Follow up in 6 months. Good to see you!

## 2019-02-27 ENCOUNTER — Ambulatory Visit (INDEPENDENT_AMBULATORY_CARE_PROVIDER_SITE_OTHER): Payer: 59 | Admitting: Psychiatry

## 2019-02-27 ENCOUNTER — Other Ambulatory Visit: Payer: Self-pay

## 2019-02-27 DIAGNOSIS — F419 Anxiety disorder, unspecified: Secondary | ICD-10-CM

## 2019-02-27 DIAGNOSIS — F331 Major depressive disorder, recurrent, moderate: Secondary | ICD-10-CM

## 2019-02-27 NOTE — Progress Notes (Signed)
Virtual Visit via Video Note  I connected with [name] [name] on 02/27/19 at  3:00 PM EDT by a video enabled telemedicine application and verified that I am speaking with the correct person using two identifiers.  Location: Patient: [name] [name] Provider: Lise Auer, LCSW   I discussed the limitations of evaluation and management by telemedicine and the availability of in person appointments. The patient expressed understanding and agreed to proceed.  History of Present Illness: MDD and anxiety due to medical condition   Observations/Objective: Counselor met with [name] [name] for individual therapy via Webex. Counselor assessed MH symptoms and progress on treatment plan goals. [name] [name] denied suicidal ideation or self-harm behaviors. [facility] shared that she continues to make progress in communicating her needs, setting boundaries and in working up to full-time hours at her job. Counselor celebrated her success. Counselor assessed daily functioning. [address] [name] [name] is showing improvements in her anxiety levels, as she has been able to leave her house a few more times and spend time with a friend, despite fears of COVID contraction. [facility] shared that she is in a space that she is able to think about starting a hobby and doing more things she used to enjoy due to increased energy and motivation. Counselor assessed this change and explored ideas for how she can make use of her time for fun and enjoyment. Counselor and [facility] discussed future sessions and overall progress and application of skills at work and in her personal life. [address] [name] [name] is proud of the progress made thus far.   Assessment and Plan: Counselor will continue to meet with patient to address treatment plan goals. Patient will continue to follow recommendations of providers and implement skills learned in session.  Follow Up Instructions: Counselor will send information for next session via Webex.     I discussed the assessment and  treatment plan with the patient. The patient was provided an opportunity to ask questions and all were answered. The patient agreed with the plan and demonstrated an understanding of the instructions.   The patient was advised to call back or seek an in-person evaluation if the symptoms worsen or if the condition fails to improve as anticipated.  I provided 55 minutes of non-face-to-face time during this encounter.   Lise Auer, LCSW

## 2019-03-01 ENCOUNTER — Encounter (HOSPITAL_COMMUNITY): Payer: Self-pay | Admitting: Psychiatry

## 2019-04-04 ENCOUNTER — Other Ambulatory Visit: Payer: Self-pay

## 2019-04-04 ENCOUNTER — Ambulatory Visit (HOSPITAL_COMMUNITY): Payer: 59 | Admitting: Psychiatry

## 2019-04-11 ENCOUNTER — Other Ambulatory Visit: Payer: Self-pay | Admitting: Neurology

## 2019-04-11 DIAGNOSIS — G4711 Idiopathic hypersomnia with long sleep time: Secondary | ICD-10-CM

## 2019-04-11 DIAGNOSIS — G4733 Obstructive sleep apnea (adult) (pediatric): Secondary | ICD-10-CM

## 2019-04-11 DIAGNOSIS — Z9989 Dependence on other enabling machines and devices: Secondary | ICD-10-CM

## 2019-04-11 MED ORDER — AMPHETAMINE-DEXTROAMPHETAMINE 10 MG PO TABS: 10.0000 mg | ORAL_TABLET | Freq: Two times a day (BID) | ORAL | 0 refills | Status: DC

## 2019-04-11 NOTE — Telephone Encounter (Signed)
Pt is requesting a refill of amphetamine-dextroamphetamine (ADDERALL) 10 MG tablet, to be sent to [facility] [address], [place] [address]

## 2019-04-11 NOTE — Telephone Encounter (Signed)
Pt is due for a refill for adderall. Pt is up to date on her appts. [place] Controlled Substance Registry checked.

## 2019-04-12 ENCOUNTER — Other Ambulatory Visit: Payer: Self-pay

## 2019-04-12 ENCOUNTER — Ambulatory Visit (INDEPENDENT_AMBULATORY_CARE_PROVIDER_SITE_OTHER): Payer: 59 | Admitting: Psychiatry

## 2019-04-12 DIAGNOSIS — F419 Anxiety disorder, unspecified: Secondary | ICD-10-CM | POA: Diagnosis not present

## 2019-04-13 ENCOUNTER — Encounter (HOSPITAL_COMMUNITY): Payer: Self-pay | Admitting: Psychiatry

## 2019-04-13 NOTE — Progress Notes (Signed)
Virtual Visit via Video Note  I connected with [name] on 04/13/19 at  1:00 PM EDT by a video enabled telemedicine application and verified that I am speaking with the correct person using two identifiers.  Location: Patient: [name] [name] Provider: Lise Auer, LCSW   I discussed the limitations of evaluation and management by telemedicine and the availability of in person appointments. The patient expressed understanding and agreed to proceed.  History of Present Illness: Anxiety   Observations/Objective: Counselor met with [name] [name] for individual therapy via Webex. Counselor assessed MH symptoms and progress on treatment plan goals. [name] presented with mild anxiety and depression in remission. [name] [name] denied suicidal ideation or self-harm behaviors. [facility] shared progress and updates on current living situation and daily functioning. [place] had all positive reports for mental health, vocationally, socially, relationally, and physically. Counselor and [employer] spent session identifying additional coping skills and strategies she can implement for maintenance of progress and for preparing for returning to work in person in the future. [address] [name] [name] was an active participant and receptive to feedback.   Assessment and Plan: Counselor will continue to meet with patient to address treatment plan goals. Patient will continue to follow recommendations of providers and implement skills learned in session.  Follow Up Instructions: Counselor will send information for next session via Webex.     I discussed the assessment and treatment plan with the patient. The patient was provided an opportunity to ask questions and all were answered. The patient agreed with the plan and demonstrated an understanding of the instructions.   The patient was advised to call back or seek an in-person evaluation if the symptoms worsen or if the condition fails to improve as anticipated.  I provided 50  minutes of non-face-to-face time during this encounter.   Lise Auer, LCSW

## 2019-04-25 ENCOUNTER — Ambulatory Visit (INDEPENDENT_AMBULATORY_CARE_PROVIDER_SITE_OTHER): Payer: 59 | Admitting: Psychiatry

## 2019-04-25 ENCOUNTER — Other Ambulatory Visit: Payer: Self-pay

## 2019-04-25 ENCOUNTER — Encounter (HOSPITAL_COMMUNITY): Payer: Self-pay | Admitting: Psychiatry

## 2019-04-25 DIAGNOSIS — F331 Major depressive disorder, recurrent, moderate: Secondary | ICD-10-CM

## 2019-04-25 DIAGNOSIS — F419 Anxiety disorder, unspecified: Secondary | ICD-10-CM

## 2019-04-25 MED ORDER — BUPROPION HCL ER (XL) 300 MG PO TB24: 300.0000 mg | ORAL_TABLET | ORAL | 2 refills | Status: DC

## 2019-04-25 MED ORDER — DULOXETINE HCL 30 MG PO CPEP: ORAL_CAPSULE | ORAL | 2 refills | Status: DC

## 2019-04-25 MED ORDER — ESZOPICLONE 2 MG PO TABS: 2.0000 mg | ORAL_TABLET | Freq: Every evening | ORAL | 0 refills | Status: DC | PRN

## 2019-04-25 NOTE — Progress Notes (Signed)
Virtual Visit via Telephone Note  I connected with [name] [name] on 04/25/19 at  4:20 PM EDT by telephone and verified that I am speaking with the correct person using two identifiers.   I discussed the limitations, risks, security and privacy concerns of performing an evaluation and management service by telephone and the availability of in person appointments. I also discussed with the patient that there may be a patient responsible charge related to this service. The patient expressed understanding and agreed to proceed.   History of Present Illness: Patient was evaluated by phone session.  She still struggle with insomnia though overall her sleep is improved by cutting down her Adderall.  She is still taking Adderall 10 mg 2 tablet in the morning.  She used to take higher dose in the past.  Recently she is seen neurology and there were no changes.  She is using CPAP.  She reported that she is on the bed at night for 10 hours but did not get restful sleep.  Her mind still racing.  She had decided to come off from [place] because she does not want to take too many medication.  She admitted though she cut down her Adderall but now she started drinking coffee to help her attention and focus.  Her cognition is improved from the past.  She is able to write and start reading books.  She also now working full-time from home.  She was doing part-time due to injury but now resumed full-time work virtually.  She works in a [employer].  Patient has no tremors, shakes or any EPS.  She has a history of TIA and memory impairment.  She denies any suicidal thoughts or homicidal thoughts.  She denies any agitation, anger, mania or any psychosis.  She lives by herself.  She is compliant with [facility] therapy.  Past psychiatric history; No history of psychiatric inpatient treatment or any suicidal attempt. No history of psychosis or mania. No history of abuse. Taking Wellbutrin prescribed by PCP. Dose  increased to 450 few years ago.We tried Lamictal but caused rash. Neurologist had prescribed Adderall to help her memory.    Psychiatric Specialty Exam: Physical Exam  ROS  Last menstrual period 06/28/2000.There is no height or weight on file to calculate BMI.  General Appearance: NA  Eye Contact:  NA  Speech:  Clear and Coherent and Slow  Volume:  Normal  Mood:  Anxious  Affect:  NA  Thought Process:  Goal Directed  Orientation:  Full (Time, Place, and Person)  Thought Content:  WDL  Suicidal Thoughts:  No  Homicidal Thoughts:  No  Memory:  Immediate;   Fair Recent;   Fair Remote;   Good  Judgement:  Good  Insight:  Good  Psychomotor Activity:  NA  Concentration:  Concentration: Fair and Attention Span: Fair  Recall:  Good  Fund of Knowledge:  Good  Language:  Good  Akathisia:  No  Handed:  Right  AIMS (if indicated):     Assets:  Communication Skills Desire for Improvement Housing Resilience Talents/Skills Transportation  ADL's:  Intact  Cognition:  WNL  Sleep:   fair      Assessment and Plan: Major depressive disorder, recurrent.  Generalized anxiety disorder.  I reviewed notes from neurology and current medication.  She is taking Cymbalta, Wellbutrin.  She is also taking Adderall 10 mg 2 tablet in the morning.  We discussed stimulant and coffee can cause insomnia.  She is willing to cut down  her Adderall further to take only 10 mg to see if insomnia get better.  She does not want to take Lunesta however I recommend if she still cannot sleep well then she may consider taking Lunesta 2 mg as needed.  I encouraged to compliant with CPAP.  Patient has no tremors, shakes or any EPS.  She has taken few times Xanax however I recommended if sleeps improved by reducing Adderall then should not take the Xanax.  Encouraged to continue therapy with [facility].  Recommended to call us back if she has any question of any concern.  Follow-up in 3 months.  Follow Up  Instructions:    I discussed the assessment and treatment plan with the patient. The patient was provided an opportunity to ask questions and all were answered. The patient agreed with the plan and demonstrated an understanding of the instructions.   The patient was advised to call back or seek an in-person evaluation if the symptoms worsen or if the condition fails to improve as anticipated.  I provided 20 minutes of non-face-to-face time during this encounter.   Cleotis Nipper, MD

## 2019-05-01 ENCOUNTER — Other Ambulatory Visit: Payer: Self-pay

## 2019-05-01 NOTE — Telephone Encounter (Signed)
Medication refill request: premarin 0.625mg  tablet Last AEX:  04-28-2018 Next AEX: 07-11-2019 Last MMG (if hormonal medication request): 12/19 bilateral & left breast follow ups done. See imaging. Last one was 01-05-2019 birads 3:probably benign Refill authorized: please approve until aex if appropriate.

## 2019-05-02 MED ORDER — ESTROGENS CONJUGATED 0.625 MG PO TABS: 0.6250 mg | ORAL_TABLET | Freq: Every day | ORAL | 0 refills | Status: DC

## 2019-05-03 ENCOUNTER — Other Ambulatory Visit: Payer: Self-pay

## 2019-05-03 ENCOUNTER — Ambulatory Visit (INDEPENDENT_AMBULATORY_CARE_PROVIDER_SITE_OTHER): Payer: 59 | Admitting: Psychiatry

## 2019-05-03 ENCOUNTER — Encounter (HOSPITAL_COMMUNITY): Payer: Self-pay | Admitting: Psychiatry

## 2019-05-03 DIAGNOSIS — F331 Major depressive disorder, recurrent, moderate: Secondary | ICD-10-CM

## 2019-05-03 DIAGNOSIS — F419 Anxiety disorder, unspecified: Secondary | ICD-10-CM

## 2019-05-03 NOTE — Progress Notes (Signed)
Virtual Visit via Video Note  I connected with [name] [name] on 05/03/19 at  1:00 PM EST by a video enabled telemedicine application and verified that I am speaking with the correct person using two identifiers.  Location: Patient: [name] [name] Provider: Lise Auer, LCSW   I discussed the limitations of evaluation and management by telemedicine and the availability of in person appointments. The patient expressed understanding and agreed to proceed.  History of Present Illness: MDD and Anxiety Disorder due to medical condition   Observations/Objective: Counselor met with [name] [name] for individual therapy via Webex. Counselor assessed MH symptoms and progress on treatment plan goals. [facility] presented with moderate depression and moderate anxiety. [name] [name] denied suicidal ideation or self-harm behaviors. [facility] shared that she is currently experience a work related issue that is triggering her depressive and anxiety symptoms to peek. Counselor explored work situation and the coping strategies she has already attempted and implemented. Counselor processed thoughts and feelings using cognitive coping interventions. [name] [name] was able to evaluate her thoughts and feelings to shift to a more rational and solution focused mindset. [name] [name] reported being willing to implement strategies. [facility] shared that she and co-workers have noted that she has made much progress in various areas of her work performance, and she attributes this to her work in therapy.   Assessment and Plan: Counselor will continue to meet with patient to address treatment plan goals. Patient will continue to follow recommendations of providers and implement skills learned in session.  Follow Up Instructions: Counselor will send information for next session via Webex.     I discussed the assessment and treatment plan with the patient. The patient was provided an opportunity to ask questions and all were answered. The patient  agreed with the plan and demonstrated an understanding of the instructions.   The patient was advised to call back or seek an in-person evaluation if the symptoms worsen or if the condition fails to improve as anticipated.  I provided 60 minutes of non-face-to-face time during this encounter.   Lise Auer, LCSW

## 2019-05-04 ENCOUNTER — Ambulatory Visit: Payer: Managed Care, Other (non HMO) | Admitting: Obstetrics and Gynecology

## 2019-05-11 ENCOUNTER — Ambulatory Visit: Payer: Managed Care, Other (non HMO) | Admitting: Obstetrics and Gynecology

## 2019-05-17 ENCOUNTER — Other Ambulatory Visit: Payer: Self-pay

## 2019-05-21 ENCOUNTER — Ambulatory Visit: Payer: Managed Care, Other (non HMO) | Admitting: Obstetrics and Gynecology

## 2019-05-21 ENCOUNTER — Other Ambulatory Visit (HOSPITAL_COMMUNITY)
Admission: RE | Admit: 2019-05-21 | Discharge: 2019-05-21 | Disposition: A | Discharge status: Home / Self Care | Payer: Managed Care, Other (non HMO) | Source: Ambulatory Visit | Attending: Obstetrics and Gynecology | Admitting: Obstetrics and Gynecology

## 2019-05-21 ENCOUNTER — Other Ambulatory Visit: Payer: Self-pay

## 2019-05-21 ENCOUNTER — Encounter: Payer: Self-pay | Admitting: Obstetrics and Gynecology

## 2019-05-21 VITALS — BP 122/74 | HR 90 | Temp 96.8°F | Resp 16 | Ht 65.5 in | Wt 140.4 lb

## 2019-05-21 DIAGNOSIS — Z01419 Encounter for gynecological examination (general) (routine) without abnormal findings: Secondary | ICD-10-CM | POA: Diagnosis present

## 2019-05-21 DIAGNOSIS — Z113 Encounter for screening for infections with a predominantly sexual mode of transmission: Secondary | ICD-10-CM | POA: Diagnosis present

## 2019-05-21 MED ORDER — ESTROGENS CONJUGATED 0.625 MG PO TABS: 0.6250 mg | ORAL_TABLET | Freq: Every day | ORAL | 3 refills | Status: DC

## 2019-05-21 NOTE — Progress Notes (Signed)
53 y.o. G0P0000 Divorced [place] American female here for annual exam.    Having low blood sugar levels.   Working remotely.  Her office is in [place].   PCP: Reynold Bowen, MD    Patient's last menstrual period was 06/28/2000.           Sexually active: No.  The current method of family planning is status post hysterectomy.    Exercising: Yes.    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 02/2013 Ascus with Neg HR HPV MMG:  01/05/19 Left Breast Diagnostic MM/US - BIRADS 3:Probably Benign/density c Colonoscopy:  05-02-13 normal with Dr.Mann;next due 04/2020 BMD:   n/a  Result  n/a TDaP:  03/09/13 Gardasil:   n/a HIV: 2016 NR Hep C: unsure Screening Labs:  Hb today: PCP Flu vaccine:  Completed/    reports that she has never smoked. She has never used smokeless tobacco. She reports that she does not drink alcohol or use drugs.  Past Medical History:  Diagnosis Date  . Breast cyst    left  . 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 Diabetes mellitus   . Dry eyes, bilateral 2018  . Fall at home 05/2018   injured low back/spine/sciatic nerve--had 2 spinal injections  . Ketoacidosis 5188,4166  . Meningitis 06/2001   --hospital  . Sleep apnea 2013  . 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 Stress   . Thyroid disease    hypothyroidism  . VAIN I (vaginal intraepithelial neoplasia grade I) 2013    Past Surgical History:  Procedure Laterality Date  . ABDOMINAL HYSTERECTOMY     2004  . CERVICAL BIOPSY  W/ LOOP ELECTRODE EXCISION  01-24-95   CIN I  . COLPOSCOPY  11-21-02   CIN III  . COLPOSCOPY  05-05-12   no lesions--needs repeat pap 08/2012 per Dr. Joan Flores  . HAND SURGERY  2004   tore tendon  . positive HPV  03/21/15   normal pap  and negative types 16/18  . SOFT TISSUE CYST EXCISION  12/2010   left breast epidermoid inclusion cyst  . SPINE SURGERY  05/21/14  . TONSILLECTOMY AND ADENOIDECTOMY      Current Outpatient Medications  Medication Sig Dispense Refill  . ALPRAZolam (XANAX) 0.5 MG tablet Take 0.25 mg by mouth as needed for anxiety.     Marland Kitchen amphetamine-dextroamphetamine (ADDERALL) 10 MG tablet Take 1 tablet (10 mg total) by mouth 2 (two) times daily. 60 tablet 0  . BAYER CONTOUR NEXT TEST test strip     . buPROPion (WELLBUTRIN XL) 300 MG 24 hr tablet Take 1 tablet (300 mg total) by mouth every morning. 30 tablet 2  . cyclobenzaprine (FLEXERIL) 10 MG tablet Take 10 mg by mouth 3 (three) times daily as needed for muscle spasms.    . DULoxetine (CYMBALTA) 30 MG capsule Take one capsule BID 60 capsule 2  . estrogens, conjugated, (PREMARIN) 0.625 MG tablet Take 1 tablet (0.625 mg total) by mouth daily. 90 tablet 0  . eszopiclone (LUNESTA) 2 MG TABS tablet Take 1 tablet (2 mg total) by mouth at bedtime as needed for sleep. Take immediately before bedtime 30 tablet 0  . Insulin Human (INSULIN PUMP) SOLN Inject  25 each into the skin as directed. Throughout the day (Per BS)    . insulin lispro (HUMALOG) 100 UNIT/ML injection Inject 25 Units into the skin as needed for high blood sugar.     . levothyroxine (SYNTHROID, LEVOTHROID) 137 MCG tablet Take 137 mcg by mouth daily.    . Prucalopride Succinate (MOTEGRITY) 2 MG TABS Take 2 mg by mouth daily.     Marland Kitchen XIIDRA 5 % SOLN Apply to eye 2 (two) times daily.     No current facility-administered medications for this visit.     Family History  Problem Relation Age of Onset  . Other Mother        cysts  . Diabetes Father   . Kidney disease Father   . Stroke Paternal Aunt   . Breast cancer Neg Hx     Review of Systems  All other systems reviewed and are negative.   Exam:   BP 122/74   Pulse 90   Temp (!) 96.8 F (36 C) (Temporal)   Resp 16   Ht 5' 5.5" (1.664 m)    Wt 140 lb 6.4 oz (63.7 kg)   LMP 06/28/2000   BMI 23.01 kg/m     General appearance: alert, cooperative and appears stated age Head: normocephalic, without obvious abnormality, atraumatic Neck: no adenopathy, supple, symmetrical, trachea midline and thyroid normal to inspection and palpation Lungs: clear to auscultation bilaterally Breasts: normal appearance, no masses or tenderness, No nipple retraction or dimpling, No nipple discharge or bleeding, No axillary adenopathy Heart: regular rate and rhythm Abdomen: soft, non-tender; no masses, no organomegaly Extremities: extremities normal, atraumatic, no cyanosis or edema Skin: skin color, texture, turgor normal. No rashes or lesions Lymph nodes: cervical, supraclavicular, and axillary nodes normal. Neurologic: grossly normal  Pelvic: External genitalia:  no lesions              No abnormal inguinal nodes palpated.              Urethra:  normal appearing urethra with no masses, tenderness or lesions              Bartholins and Skenes: normal                 Vagina: normal appearing vagina with normal color and discharge, no lesions              Cervix: absent              Pap taken: Yes.   Bimanual Exam:  Uterus: absent              Adnexa: no mass, fullness, tenderness              Rectal exam: Yes.  .  Confirms.              Anus:  normal sphincter tone, no lesions  Chaperone was present for exam.  Assessment:   Well woman visit with normal exam. Status post TVH.  History of CIN III and VAIN I.  Pap2016normal and positive HR HPV, negative 16/18. ERT patient.   HSV.  DM. Varicose veins. STD screening.   Plan: Mammogram dx bilateral and left breast breast US in Jan. 2021. Self breast awareness reviewed. Pap and HR HPV as above. Guidelines for Calcium, Vitamin D, regular exercise program including cardiovascular and weight bearing exercise. Refill of Premarin tabs.   I discussed potential increased risk of blood  clots.  STD screening.  Follow up annually and  prn.   After visit summary provided.

## 2019-05-21 NOTE — Patient Instructions (Signed)

## 2019-05-22 LAB — CYTOLOGY - PAP
Adequacy: ABSENT
Chlamydia: NEGATIVE
Comment: NEGATIVE
Comment: NEGATIVE
Comment: NEGATIVE
Comment: NORMAL
Diagnosis: NEGATIVE
High risk HPV: NEGATIVE
Neisseria Gonorrhea: NEGATIVE
Trichomonas: NEGATIVE

## 2019-05-22 LAB — HEPATITIS C ANTIBODY: Hep C Virus Ab: <0.1 s/co ratio (ref 0.0–0.9)

## 2019-05-22 LAB — HEP, RPR, HIV PANEL
HIV Screen 4th Generation wRfx: NONREACTIVE
Hepatitis B Surface Ag: NEGATIVE
RPR Ser Ql: NONREACTIVE

## 2019-05-31 ENCOUNTER — Telehealth: Payer: Self-pay

## 2019-05-31 NOTE — Telephone Encounter (Signed)
-----   Message from Nunzio Cobbs, MD sent at 05/23/2019  9:29 AM EST ----- Results to patient through My Chart. Pap recall - 12 months. Hx LEEP, hysterectomy for recurrent dysplasia, and abnormal pap of vagina following hysterectomy.   [place],  I am sharing your test results from your recent visit.   Your pap and high risk HPV testing are normal and negative.  Your testing for infectious disease is all negative for HIV, syphilis, hepatitis B and C, trichomonas, gonorrhea, and chlamydia.   Please contact the office for any questions.   Have a good Thanksgiving!  Josefa Half, MD

## 2019-05-31 NOTE — Telephone Encounter (Signed)
Left message to call Garo Heidelberg, CMA. °

## 2019-06-12 NOTE — Telephone Encounter (Signed)
Results given to patient on 06-06-19--see result note.

## 2019-06-19 ENCOUNTER — Encounter (HOSPITAL_COMMUNITY): Payer: Self-pay | Admitting: Psychiatry

## 2019-06-19 ENCOUNTER — Other Ambulatory Visit: Payer: Self-pay

## 2019-06-19 ENCOUNTER — Ambulatory Visit (INDEPENDENT_AMBULATORY_CARE_PROVIDER_SITE_OTHER): Payer: 59 | Admitting: Psychiatry

## 2019-06-19 DIAGNOSIS — F331 Major depressive disorder, recurrent, moderate: Secondary | ICD-10-CM

## 2019-06-19 DIAGNOSIS — F419 Anxiety disorder, unspecified: Secondary | ICD-10-CM

## 2019-06-19 NOTE — Progress Notes (Signed)
Virtual Visit via Video Note  I connected with Vondell Kowal on 06/19/19 at  1:00 PM EST by a video enabled telemedicine application and verified that I am speaking with the correct person using two identifiers.  Location: Patient: [name] [name] Provider: Lise Auer, LCSW   I discussed the limitations of evaluation and management by telemedicine and the availability of in person appointments. The patient expressed understanding and agreed to proceed.  History of Present Illness: MDD and Anxiety due to medical condition   Observations/Objective: Counselor met with [name] [name] for individual therapy via Webex. Counselor assessed MH symptoms and progress on treatment plan goals. [facility] presents with moderate depression and mild anxiety.  [name] [name] denied suicidal ideation or self-harm behaviors. [facility] shared that she was recently passed over for a job promotion, which sparked an emotional response of anger, frustration, decreased self-esteem/confidence, betrayal and disengagement. Counselor processed through [place]'s thoughts and feelings, identifying health expressions of emotions and communicating appropriately in the context of her profession. Counselor and [employer] engaged in solution-focused interventions to address the need for being valued, increasing self-worth, job satisfaction and better work-life balance. Counselor assessed daily functioning and contact with support system. [facility] noted that she continues to be physically isolated from others, but makes efforts to connect using devices. She is reevaluating a long term relationship, which has impacted her mentally and emotionally, causing uncertainties for her future which increases anxiety. Counselor promoted increasing connect with supports, engaging in hobbies/activities and implementing self-care practices.   Assessment and Plan: Counselor will continue to meet with patient to address treatment plan goals. Patient will continue to follow  recommendations of providers and implement skills learned in session.  Follow Up Instructions: Counselor will send information for next session via Webex.    I discussed the assessment and treatment plan with the patient. The patient was provided an opportunity to ask questions and all were answered. The patient agreed with the plan and demonstrated an understanding of the instructions.   The patient was advised to call back or seek an in-person evaluation if the symptoms worsen or if the condition fails to improve as anticipated.  I provided 55 minutes of non-face-to-face time during this encounter.   Lise Auer, LCSW

## 2019-07-06 ENCOUNTER — Other Ambulatory Visit: Payer: Managed Care, Other (non HMO)

## 2019-07-11 ENCOUNTER — Ambulatory Visit: Payer: Managed Care, Other (non HMO) | Admitting: Obstetrics and Gynecology

## 2019-07-23 ENCOUNTER — Other Ambulatory Visit: Payer: Self-pay

## 2019-07-23 ENCOUNTER — Ambulatory Visit
Admission: RE | Admit: 2019-07-23 | Discharge: 2019-07-23 | Disposition: A | Discharge status: Home / Self Care | Payer: Managed Care, Other (non HMO) | Source: Ambulatory Visit | Attending: Obstetrics and Gynecology | Admitting: Obstetrics and Gynecology

## 2019-07-23 DIAGNOSIS — N6489 Other specified disorders of breast: Secondary | ICD-10-CM

## 2019-07-28 ENCOUNTER — Other Ambulatory Visit (HOSPITAL_COMMUNITY): Payer: Self-pay | Admitting: Psychiatry

## 2019-07-28 DIAGNOSIS — F331 Major depressive disorder, recurrent, moderate: Secondary | ICD-10-CM

## 2019-07-28 DIAGNOSIS — F419 Anxiety disorder, unspecified: Secondary | ICD-10-CM

## 2019-07-30 ENCOUNTER — Other Ambulatory Visit: Payer: Self-pay

## 2019-07-30 ENCOUNTER — Ambulatory Visit (INDEPENDENT_AMBULATORY_CARE_PROVIDER_SITE_OTHER): Payer: 59 | Admitting: Psychiatry

## 2019-07-30 ENCOUNTER — Encounter (HOSPITAL_COMMUNITY): Payer: Self-pay | Admitting: Psychiatry

## 2019-07-30 DIAGNOSIS — F419 Anxiety disorder, unspecified: Secondary | ICD-10-CM

## 2019-07-30 DIAGNOSIS — F331 Major depressive disorder, recurrent, moderate: Secondary | ICD-10-CM

## 2019-07-30 MED ORDER — DULOXETINE HCL 30 MG PO CPEP: ORAL_CAPSULE | ORAL | 1 refills | Status: DC

## 2019-07-30 MED ORDER — BUPROPION HCL ER (XL) 300 MG PO TB24: 300.0000 mg | ORAL_TABLET | ORAL | 1 refills | Status: DC

## 2019-07-30 NOTE — Progress Notes (Signed)
Virtual Visit via Telephone Note  I connected with [name] [name] on 07/30/19 at  4:20 PM EST by telephone and verified that I am speaking with the correct person using two identifiers.   I discussed the limitations, risks, security and privacy concerns of performing an evaluation and management service by telephone and the availability of in person appointments. I also discussed with the patient that there may be a patient responsible charge related to this service. The patient expressed understanding and agreed to proceed.   History of Present Illness: Patient was evaluated through phone session.  She has cut down her Adderall and taking 10 mg only.  She also taking melatonin and she reported her sleep is good.  She only took few times Lunesta and she feels that she does not need it since her sleep is good.  She is working full-time and admitted sometimes a lot of stress.  She works for a [employer] and sometimes she is busy reading and job is very demanding.  She is in therapy with Bethany.  She is compliant with Cymbalta and Wellbutrin.  She has no tremors, shakes or any EPS.  She admitted sometimes crying spells when she think about her job.  She where she had a good support from her coworkers.  Patient has history of TIA and memory impairment but lately there has been no changes in her cognition.  She is getting Xanax which he takes as needed for anxiety which also helps her sleep from her other physician.  Patient denies any mania, psychosis or any suicidal thoughts.  Her energy level is okay.  She lives by herself.   Past psychiatric history; No h/o inpatient treatment or suicidal attempt. No h/o psychosis, mania, or abuse. Took Wellbutrin prescribed by PCP. Dose increased to 450 few years ago.We tried Lamictal but caused rash. Neurologist had prescribed Adderall to help her memory.   Psychiatric Specialty Exam: Physical Exam  Review of Systems  Last menstrual period  06/28/2000.There is no height or weight on file to calculate BMI.  General Appearance: NA  Eye Contact:  NA  Speech:  Clear and Coherent and Slow  Volume:  Normal  Mood:  Anxious and Dysphoric  Affect:  NA  Thought Process:  Goal Directed  Orientation:  Full (Time, Place, and Person)  Thought Content:  Rumination  Suicidal Thoughts:  No  Homicidal Thoughts:  No  Memory:  Immediate;   Good Recent;   Good Remote;   Fair  Judgement:  Good  Insight:  Present  Psychomotor Activity:  NA  Concentration:  Concentration: Fair and Attention Span: Fair  Recall:  Good  Fund of Knowledge:  Good  Language:  Good  Akathisia:  No  Handed:  Right  AIMS (if indicated):     Assets:  Communication Skills Desire for Improvement Housing Talents/Skills Transportation  ADL's:  Intact  Cognition:  WNL  Sleep:   improved     Assessment and Plan: Major depressive disorder, recurrent.  Generalized anxiety disorder.  Since she had cut down her Adderall to 10 mg only she has noticed improvement in her sleep.  She takes melatonin and has not taken Lunesta in a while.  I encourage therapy with Bethany.  I also encourage compliance with CPAP.  She has taken few times Xanax prescribed by other physician.  We discussed stress related to work and she admitted sometimes having crying spells when she think about the job.  I encourage that if she is scared that  she not able to function very well at work then we can consider FMLA and she can focus on therapy.  At this time she feels that she can handle however promised to call us back if needed any help in the future.  I will continue Cymbalta 30 mg twice a day and Wellbutrin XL 300 mg daily.  Discussed medication side effects and benefits.  Recommended to call us back if she has any questions or any concern.  Follow-up in 2 months.  Follow Up Instructions:    I discussed the assessment and treatment plan with the patient. The patient was provided an opportunity  to ask questions and all were answered. The patient agreed with the plan and demonstrated an understanding of the instructions.   The patient was advised to call back or seek an in-person evaluation if the symptoms worsen or if the condition fails to improve as anticipated.  I provided 20 minutes of non-face-to-face time during this encounter.   Kathlee Nations, MD

## 2019-08-01 ENCOUNTER — Ambulatory Visit (INDEPENDENT_AMBULATORY_CARE_PROVIDER_SITE_OTHER): Payer: 59 | Admitting: Psychiatry

## 2019-08-01 ENCOUNTER — Encounter (HOSPITAL_COMMUNITY): Payer: Self-pay | Admitting: Psychiatry

## 2019-08-01 ENCOUNTER — Other Ambulatory Visit: Payer: Self-pay

## 2019-08-01 DIAGNOSIS — F419 Anxiety disorder, unspecified: Secondary | ICD-10-CM

## 2019-08-01 DIAGNOSIS — F331 Major depressive disorder, recurrent, moderate: Secondary | ICD-10-CM | POA: Diagnosis not present

## 2019-08-01 NOTE — Progress Notes (Signed)
Virtual Visit via Video Note  I connected with [name] [name] on 08/01/19 at  1:00 PM EST by a video enabled telemedicine application and verified that I am speaking with the correct person using two identifiers.  Location: Patient: Patient Home Provider: Home Office   I discussed the limitations of evaluation and management by telemedicine and the availability of in person appointments. The patient expressed understanding and agreed to proceed.  History of Present Illness: Depression and Anxiety   Treatment Plan Goals: [name] [name] would like to decrease anxiety related to work and medical stressors to function at her best and advocate for self more assertiveness.   Observations/Objective: Social worker met with [name] [name] for individual therapy via Webex. Counselor assessed MH symptoms and progress on treatment plan goals, with patient reporting efforts to communicate more effectively on the job and resisting emotional responses . [facility] presents with mild depression and moderate anxiety. [name] [name] denied suicidal ideation or self-harm behaviors.   [facility] shared status updates on her work position and how she communicated her concerns with upper management and lateral co-workers. Counselor processed thoughts and feelings about her approach and efforts, with [facility] stating that she has been more direct and assertive with her communications. [facility] gave examples of setting boundaries and limits with what she is willing and not willing to do, as to better manage her stress levels. Counselor assessed impact on daily functioning and health, with [facility] stating that she notices and shares with her providers that her blood pressure and blood sugar levels are elevated during her work hours. Counselor and [facility] identified additional coping strategies she can implement to reduce impact of stress on the job. [facility] plans to try/utilize strategies over the next couple weeks and report back at next session. Counselor  and [employer] worked on discharge and treatment planing in light of Counselors upcoming leave. [facility] would like to be connected with a therapist within [facility] system and will continue to see psychiatrist for medication management.   Assessment and Plan: Counselor will continue to meet with patient to address treatment plan goals. Patient will continue to follow recommendations of providers and implement skills learned in session.  Follow Up Instructions: Counselor will send information for next session via Webex.    The patient was advised to call back or seek an in-person evaluation if the symptoms worsen or if the condition fails to improve as anticipated.  I provided 55 minutes of non-face-to-face time during this encounter.   Lise Auer, LCSW

## 2019-08-15 ENCOUNTER — Encounter (HOSPITAL_COMMUNITY): Payer: Self-pay | Admitting: Psychiatry

## 2019-08-15 ENCOUNTER — Ambulatory Visit (HOSPITAL_COMMUNITY): Payer: 59 | Admitting: Psychiatry

## 2019-08-15 ENCOUNTER — Other Ambulatory Visit: Payer: Self-pay

## 2019-08-15 DIAGNOSIS — F419 Anxiety disorder, unspecified: Secondary | ICD-10-CM

## 2019-08-15 DIAGNOSIS — F331 Major depressive disorder, recurrent, moderate: Secondary | ICD-10-CM

## 2019-08-15 NOTE — Progress Notes (Signed)
Virtual Visit via Video Note  I connected with [name] [name] on 08/15/19 at  1:00 PM EST by a video enabled telemedicine application and verified that I am speaking with the correct person using two identifiers.  Location: Patient: Patient Home Provider: Home Office   I discussed the limitations of evaluation and management by telemedicine and the availability of in person appointments. The patient expressed understanding and agreed to proceed.  History of Present Illness: Depression and Anxiety   Treatment Plan Goals: [name] [name] would like to decrease anxiety related to work and medical stressors to function at her best and advocate for self more assertiveness.  Observations/Objective: Counselor met with [name] [name] for individual therapy via Webex. Counselor assessed MH symptoms and progress on treatment plan goals, with patient reporting that she is making improvements in anxiety reduction on the job through assertive communication. [facility] presents with mild depression and mild anxiety. [name] [name] denied suicidal ideation or self-harm behaviors.   [facility] shared that she has been following up with recommendations from our last session, which she has observed positive impact on her wellness, work life balance and mental health on the job. Counselor and [facility] discussed the concept of "Acceptence" in the role of her healing, empowerment and overall progress. She notes identifying what she can and cannot control has been helpful for her. She is actively trying to communicate differently and has a desire for more validation from others.   Counselor highlighted progress on overall treatment plan goals throughout the course of treatment, praising her for application of skills and concepts. Counselor and [facility] discussed discharge planning, as she will be transferred internally to [place] for continuity of care while Counselor is on temporary leave. Counselor will send additional resources.    Assessment and Plan: New Counselor will continue to meet with patient to address treatment plan goals. Patient will continue to follow recommendations of providers and implement skills learned in session.  Follow Up Instructions: Counselor will send information for next session via Webex.    The patient was advised to call back or seek an in-person evaluation if the symptoms worsen or if the condition fails to improve as anticipated.  I provided 55 minutes of non-face-to-face time during this encounter.   [name] Auer, LCSW

## 2019-08-20 ENCOUNTER — Other Ambulatory Visit: Payer: Self-pay | Admitting: Neurology

## 2019-08-20 DIAGNOSIS — G4711 Idiopathic hypersomnia with long sleep time: Secondary | ICD-10-CM

## 2019-08-20 DIAGNOSIS — G4733 Obstructive sleep apnea (adult) (pediatric): Secondary | ICD-10-CM

## 2019-08-20 MED ORDER — AMPHETAMINE-DEXTROAMPHETAMINE 10 MG PO TABS: 10.0000 mg | ORAL_TABLET | Freq: Two times a day (BID) | ORAL | 0 refills | Status: DC

## 2019-08-20 NOTE — Telephone Encounter (Signed)
[place] drug registry has been verified. Last refill was 05/01/2019 # 60 for a 30 day supply. Pt has been compliant with f/u appointments.

## 2019-08-20 NOTE — Addendum Note (Signed)
Addended by: Ann Maki T on: 08/20/2019 11:38 AM   Modules accepted: Orders

## 2019-08-20 NOTE — Telephone Encounter (Signed)
Pt has called for a refill on her amphetamine-dextroamphetamine (ADDERALL) 10 MG tablet  [facility] [address] - [place], [place] - [address]

## 2019-08-27 ENCOUNTER — Ambulatory Visit: Payer: Managed Care, Other (non HMO) | Admitting: Neurology

## 2019-09-01 ENCOUNTER — Other Ambulatory Visit (HOSPITAL_COMMUNITY): Payer: Self-pay | Admitting: Psychiatry

## 2019-09-01 DIAGNOSIS — F331 Major depressive disorder, recurrent, moderate: Secondary | ICD-10-CM

## 2019-09-03 ENCOUNTER — Ambulatory Visit (INDEPENDENT_AMBULATORY_CARE_PROVIDER_SITE_OTHER): Payer: 59 | Admitting: Psychiatry

## 2019-09-03 ENCOUNTER — Encounter (HOSPITAL_COMMUNITY): Payer: Self-pay | Admitting: Psychiatry

## 2019-09-03 ENCOUNTER — Other Ambulatory Visit: Payer: Self-pay

## 2019-09-03 DIAGNOSIS — F419 Anxiety disorder, unspecified: Secondary | ICD-10-CM

## 2019-09-03 NOTE — Progress Notes (Signed)
Virtual Visit via Video Note  I connected with [name] [name] on 09/03/19 at 11:00 AM EST by a video enabled telemedicine application and verified that I am speaking with the correct person using two identifiers.   I discussed the limitations of evaluation and management by telemedicine and the availability of in person appointments. The patient expressed understanding and agreed to proceed.   I provided 55 minutes of non-face-to-face time during this encounter.   Alonza Smoker, LCSW    Comprehensive Clinical Assessment (CCA) Note  09/03/2019 [name] [name] [id number]  Visit Diagnosis:      ICD-10-CM   1. Anxiety disorder, unspecified type  F41.9       CCA Part One  Part One has been completed on paper by the patient.  (See scanned document in Chart Review)  CCA Part Two A  Intake/Chief Complaint:  CCA Intake With Chief Complaint CCA Part Two Date: 09/03/18 CCA Part Two Time: 1136 Chief Complaint/Presenting Problem: "I need help with stress management mainly with work issues.I have been under a lot of stress and it has had a lot of implications for my diabetes management". Patients Currently Reported Symptoms/Problems: "internalize a lot of things, worry/anxiety about not getting information from others, Collateral Involvement: Armed forces technical officer; Teacher, music, Other Specialists Individual's Strengths: Detail oriented, organized, spreadsheets, into data, reading, knitting drawing, making jewelry, -hard to do many of those things now.  Individual's Preferences: "Individual or Groups are fine" Individual's Abilities: Can drive, communicate needs, coordinate healthcare, reach out to support system Type of Services Patient Feels Are Needed: Individual Therapy and Medication management, may be interested in groups if not back to work.  Initial Clinical Notes/Concerns: Patient is referred for sevices for continuity of care by therapist Lise Auer. She denies any  psychiatric hospitalizations. She has been in outpatient therapy for a year and currently sees psychiatrist Dr. Georgia Dom for medication management  Mental Health Symptoms Depression:  Depression: Change in energy/activity, Difficulty Concentrating, Fatigue, Hopelessness, Irritability, Sleep (too much or little), Tearfulness, Worthlessness  Mania:  Mania: Change in energy/activity, Irritability, Racing thoughts  Anxiety:   Anxiety: Difficulty concentrating, Fatigue, Irritability, Restlessness, Sleep, Worrying, Tension  Psychosis:  Psychosis: N/A  Trauma:  Trauma: N/A  Obsessions:  Obsessions: N/A  Compulsions:  Compulsions: N/A  Inattention:  Inattention: N/A  Hyperactivity/Impulsivity:  Hyperactivity/Impulsivity: N/A  Oppositional/Defiant Behaviors:  Oppositional/Defiant Behaviors: N/A  Borderline Personality:  Emotional Irregularity: Chronic feelings of emptiness, Unstable self-image, N/A  Other Mood/Personality Symptoms:     Mental Status Exam Appearance and self-care  Stature:    Weight:    Clothing:  Clothing: Casual  Grooming:  Grooming: Normal  Cosmetic use:  Cosmetic Use: None  Posture/gait:  Posture/Gait: Normal  Motor activity:  Motor Activity: Not Remarkable  Sensorium  Attention:  Attention: Normal  Concentration:  Concentration: Normal  Orientation:  Orientation: X5  Recall/memory:  Recall/Memory: Normal  Affect and Mood  Affect:  Affect: Anxious, Appropriate, Depressed  Mood:  Mood: Anxious, Depressed  Relating  Eye contact:    Facial expression:  Facial Expression: Anxious, Responsive  Attitude toward examiner:  Attitude Toward Examiner: Cooperative  Thought and Language  Speech flow: Speech Flow: Normal  Thought content:  Thought Content: Appropriate to mood and circumstances  Preoccupation:  Preoccupations: Somatic, Ruminations  Hallucinations:  none  Organization:  logical  Transport planner of Knowledge:  Fund of Knowledge: Average  Intelligence:   Intelligence: Average  Abstraction:  Abstraction: Normal  Judgement:  Judgement: Normal  Reality Testing:  Reality Testing: Realistic  Insight:  Insight: Good  Decision Making:  Decision Making: Normal  Social Functioning  Social Maturity:  Social Maturity: Responsible  Social Judgement:  Social Judgement: Normal  Stress  Stressors:  Stressors: Work, Illness  Coping Ability:  Coping Ability: Overwhelmed, Engineer, agricultural Deficits:    Supports:     Family and Psychosocial History: Family history Marital status: Divorced Divorced, when?: Divorced in 2010 after 7 years Are you sexually active?: No Does patient have children?: No  Childhood History:  Childhood History By whom was/is the patient raised?: Both parents Additional childhood history information: Parents divorced when patient was 66 years old . Patient was born and reared in [place], [place]. Description of patient's relationship with caregiver when they were a child: Grew up with both parents, pretty good, pretty normal, really close with mother, was also daddy's girl, Patient's description of current relationship with people who raised him/her: father is deceased, strained but cordial relationship with mother How were you disciplined when you got in trouble as a child/adolescent?: mother would take things away, spanked when younger Does patient have siblings?: Yes Number of Siblings: 3(Patient is second of 4 siblings) Description of patient's current relationship with siblings: very close, baby sister is in [place]. 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 How long has patient been employed?: 4 years Patient's job has been impacted by current illness: Yes Describe how patient's job has been impacted: had to be hosptialized because of stress in 2019, on medical leave for 2 months in 2019 What is the longest time patient has a held a job?: 16 years Where was the patient employed at that time?: [employer] Are There Guns or Other Weapons in Your Home?: No  Education: Education Last Grade Completed: 12 Did Garment/textile technologist From [employer]?: Yes Did You Attend College?: Yes(attended [employer], a year  away from getting a degree ( Production assistant, radio) attended [employer] for two years) Did You Have Any Special Interests In School?: Math, Special educational needs teacher, cheerleading, drawing, Spanish, booster club Did You Have An Individualized Education Program (IIEP): No Did You Have Any Difficulty At [employer]?: No  Religion: Religion/Spirituality Are You A Religious Person?: Yes What is Your Religious Affiliation?: Christian How Might This Affect Treatment?: "it shouldn't"  Leisure/Recreation: Leisure / Recreation Leisure and Hobbies: " collect first edition books, knit, walking in the park, hang out with friends "  Exercise/Diet: Exercise/Diet Do You Exercise?: Yes What Type of Exercise Do You Do?: Run/Walk How Many Times a Week Do You Exercise?: 4-5 times a week Have You Gained or Lost A Significant Amount of Weight in the Past Six Months?: No Do You Follow a Special Diet?: Yes Type of Diet: Diabetic diet Do You Have Any Trouble Sleeping?: Yes  CCA Part Two C  Alcohol/Drug Use: Alcohol / Drug Use Pain Medications: See Chart Prescriptions: See Chart Over the Counter: See Chart History of alcohol / drug use?: No history of alcohol / drug abuse  CCA Part Three  ASAM's:  Six Dimensions  of Multidimensional Assessment  Dimension 1:  Acute Intoxication and/or Withdrawal Potential:    Dimension 2:  Biomedical Conditions and Complications:    Dimension 3:  Emotional, Behavioral, or Cognitive Conditions and Complications:    Dimension 4:  Readiness to Change:    Dimension 5:  Relapse, Continued use, or Continued Problem Potential:    Dimension 6:  Recovery/Living Environment:     Substance use Disorder (SUD)   Social Function:  Social Functioning Social Maturity: Responsible Social Judgement: Normal  Stress:  Stress Stressors: Work, Illness Coping Ability: Overwhelmed, Resilient Patient Takes Medications The Way The Doctor Instructed?: Yes Priority Risk: Low Acuity  Risk Assessment- Self-Harm Potential: Risk Assessment For Self-Harm Potential Thoughts of Self-Harm: No current thoughts Method: No plan Availability of Means: No access/NA  Risk Assessment -Dangerous to Others Potential: Risk Assessment For Dangerous to Others Potential Method: No Plan Availability of Means: No access or NA Intent: Vague intent or NA Notification Required: No need or identified person  DSM5 Diagnoses: Patient Active Problem List   Diagnosis Date Noted  . Confusion 11/28/2017  . Dizziness 11/28/2017  . Insulin pump in place 09/22/2017  . Cellulitis 09/21/2017  . Diabetic ketoacidosis without coma associated with diabetes mellitus due to underlying condition (HCC) 09/21/2017  . AKI (acute kidney injury) (HCC) 09/20/2017  . Hyperglycemia 09/20/2017  . Hypotension 09/20/2017  . Sepsis (HCC) 09/20/2017  . Intermittent confusion 04/18/2017  . OSA (obstructive sleep apnea) 04/18/2017  . HYPOTHYROIDISM 09/21/2007  . DIABETES MELLITUS, TYPE I 09/21/2007  . ANXIETY DEPRESSION 09/21/2007  . NAUSEA 09/21/2007    Patient Centered Plan: Patient is on the following Treatment Plan(s):  Anxiety and Depression  Recommendations for Services/Supports/Treatments: Recommendations for  Services/Supports/Treatments Recommendations For Services/Supports/Treatments: Individual Therapy, Medication Management  Treatment Plan Summary Patient would like to decrease anxiety related to work and medical stressors to function at her best and advocate for self more assertiveness   Referrals to Alternative Service(s): Referred to Alternative Service(s):   Place:   Date:   Time:    Referred to Alternative Service(s):   Place:   Date:   Time:    Referred to Alternative Service(s):   Place:   Date:   Time:    Referred to Alternative Service(s):   Place:   Date:   Time:     [name]

## 2019-09-17 ENCOUNTER — Other Ambulatory Visit: Payer: Self-pay

## 2019-09-17 ENCOUNTER — Encounter: Payer: Self-pay | Admitting: Neurology

## 2019-09-17 ENCOUNTER — Ambulatory Visit: Payer: Managed Care, Other (non HMO) | Admitting: Neurology

## 2019-09-17 DIAGNOSIS — G4733 Obstructive sleep apnea (adult) (pediatric): Secondary | ICD-10-CM | POA: Diagnosis not present

## 2019-09-17 DIAGNOSIS — G4711 Idiopathic hypersomnia with long sleep time: Secondary | ICD-10-CM

## 2019-09-17 DIAGNOSIS — Z9989 Dependence on other enabling machines and devices: Secondary | ICD-10-CM | POA: Diagnosis not present

## 2019-09-17 MED ORDER — AMPHETAMINE-DEXTROAMPHETAMINE 10 MG PO TABS: 10.0000 mg | ORAL_TABLET | Freq: Two times a day (BID) | ORAL | 0 refills | Status: DC

## 2019-09-17 NOTE — Progress Notes (Signed)
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 FROM.Oropharynx exam reveals: mildmouth dryness, good dental hygiene and mild airway crowding. Tongue protrudes centrally and palate elevates symmetrically. Tonsils are absent.   Chest:Clear to auscultation without wheezing, rhonchi or crackles noted.  Heart:S1+S2+0, no murmur.  Abdomen:Soft, non-tender and non-distended with normal bowel sounds appreciated on auscultation.Insulin pump.  Extremities:There is nochange.   Skin: Warm and dry without trophic changes noted.   Musculoskeletal: exam reveals no abn.  Neurologically:  Mental status: The patient is awake, alert and oriented in all 4 spheres. Her memory, attention, language and knowledge are appropriate. There is no aphasia, agnosia, apraxia or anomia. Speech is clear with normal prosody and enunciation. Thought process is linear. Mood is normal and affect is anxious.  Cranial nerves are as described above under HEENT exam.  Motor exam: Normal bulk, strength and tone is noted. There is no tremor. Sensory exam is intact to light touchin the upper and lower extremities. Fine motor skills are grossly intact. On cerebellar testing: no dysmetria, or ataxia.  Gait, station and balance are unremarkable. No veering to one side is noted. No  leaning to one side is noted. Posture is age-appropriate and stance is narrow based. No problems turning are noted.   Assessment and Plan:   In summary, Ms. Angelo is a very pleasant7 year old femalewith an underlying history of hypothyroidism, DM type I, anxiety and depression,low back pain,and s/p neck surgery, who presents forfollow up of her sleep disorder, Including obstructive sleep apnea and hypersomnolence. She has been compliant with her CPAP. Lately, she had some lapses in treatment because of her mattress situation. She is expecting a new mattress soon. She has increased her average usage and previously she was averaging barely 6 hours of CPAP usage, now her average usage is almost 7-1/2 hours. She is commended for her treatment adherence, she feels well, she is on Adderall immediate release 10 mg twice daily generic. I renewed her prescription. Mood wise she feels stable and is in regular follow-up with her psychiatrist.  In the past, she has seen a neuropsychologist.We did some workup for cognitive concerns and her workup was reassuring in that regard. She had an EEG in December 2018 which was reported as normal. She had neuropsychological evaluation in the past with Dr. Valentina Shaggy on 08/08/2014 with benign results at the time.Shealsohad workup in 2062fr TIA concern.She sawDr.Bailar-Heath. She had sleep study testing in the form of CPAP titration full night followed by a MSLT the next dayin the past. Her CPAP pressure was adequate pressure of 9 cm during the sleep study, but she had an abnormal next day nap test, in keeping  with with idiopathic hypersomnolence.She had a head CT without contrast and MRI brain without contrast on 04/18/2017 with benign results. Of note, she alsohada normal brain MRI in July 2015. Her physical and neurological exam have been nonfocal. At this juncture, she can follow-up in 1 year, she is reminded to be compliant with CPAP therapy. I answered all her  questions today and she was in agreement. I spent 20 minutes in total face-to-face time and in reviewing records during pre-charting, more than 50% of which was spent in counseling and coordination of care, reviewing test results, reviewing medications and treatment regimen and/or in discussing or reviewing the diagnosis of OSA, idiop, hypersomnolence, the prognosis and treatment options. Pertinent laboratory and imaging test results that were available during this visit with the patient were reviewed by me and considered in my medical decision making (see chart for details).

## 2019-09-17 NOTE — Patient Instructions (Signed)
I am glad that you are doing well with just a low dose of the Adderall. I have renewed your prescription. Your CPAP average usage is so much better, please keep up the good work and try to get back on it on a nightly basis once you have your mattress situation worked out. I think we can go a year for your next follow-up, please call us if you need [place] in the interim.

## 2019-09-21 ENCOUNTER — Ambulatory Visit: Payer: Managed Care, Other (non HMO) | Attending: Internal Medicine

## 2019-09-21 DIAGNOSIS — Z23 Encounter for immunization: Secondary | ICD-10-CM

## 2019-09-21 NOTE — Progress Notes (Signed)
   Covid-19 Vaccination Clinic  Name:  Leily Capek    MRN: [id number] DOB: 03/18/1966  09/21/2019  Ms. Meller was observed post Covid-19 immunization for 15 minutes without incident. She was provided with Vaccine Information Sheet and instruction to access the V-Safe system.   Ms. Catarino was instructed to call 911 with any severe reactions post vaccine: Marland Kitchen Difficulty breathing  . Swelling of face and throat  . A fast heartbeat  . A bad rash all over body  . Dizziness and weakness   Immunizations Administered    Name Date Dose VIS Date Route   Pfizer COVID-19 Vaccine 09/21/2019  4:10 PM 0.3 mL 06/08/2019 Intramuscular   Manufacturer: [employer], [employer]   Lot: [id number]   NDC: [address]-3

## 2019-10-02 ENCOUNTER — Ambulatory Visit (INDEPENDENT_AMBULATORY_CARE_PROVIDER_SITE_OTHER): Payer: 59 | Admitting: Psychiatry

## 2019-10-02 ENCOUNTER — Other Ambulatory Visit: Payer: Self-pay

## 2019-10-02 ENCOUNTER — Telehealth (HOSPITAL_COMMUNITY): Payer: Self-pay

## 2019-10-02 DIAGNOSIS — F419 Anxiety disorder, unspecified: Secondary | ICD-10-CM | POA: Diagnosis not present

## 2019-10-02 DIAGNOSIS — F331 Major depressive disorder, recurrent, moderate: Secondary | ICD-10-CM

## 2019-10-02 MED ORDER — DULOXETINE HCL 60 MG PO CPEP: 60.0000 mg | ORAL_CAPSULE | Freq: Every day | ORAL | 2 refills | Status: DC

## 2019-10-02 MED ORDER — ESZOPICLONE 2 MG PO TABS: ORAL_TABLET | ORAL | 0 refills | Status: DC

## 2019-10-02 MED ORDER — BUPROPION HCL ER (XL) 300 MG PO TB24: 300.0000 mg | ORAL_TABLET | ORAL | 2 refills | Status: DC

## 2019-10-02 NOTE — Telephone Encounter (Signed)
60 mg one a day.

## 2019-10-02 NOTE — Progress Notes (Signed)
Virtual Visit via Telephone Note  I connected with [name] [name] on 10/02/19 at  4:20 PM EDT by telephone and verified that I am speaking with the correct person using two identifiers.   I discussed the limitations, risks, security and privacy concerns of performing an evaluation and management service by telephone and the availability of in person appointments. I also discussed with the patient that there may be a patient responsible charge related to this service. The patient expressed understanding and agreed to proceed.   History of Present Illness: Patient was evaluated through phone session.  Since she cut down the Adderall and start therapy she is doing much better.  She takes Lunesta only 2-3 times a week and her sleep is improved.  She is using CPAP that also helping her sleep.  She denies any irritability, anger, mood swings.  She had a good support from her coworker.  She is taking Wellbutrin and Cymbalta as it is helping her depression.  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  She denies any mania, psychosis or any hallucination.  She lives by herself.  She has not taken her Xanax prescribed by PCP.  Her energy level is good.  Past psychiatric history; No h/o inpatient treatment or suicidal attempt. No h/o psychosis, mania, or abuse. Took Wellbutrin prescribed by PCP. Dose increased to 450 few years ago.We tried Lamictal but caused rash. Neurologist had prescribed Adderall to help her memory.   Psychiatric Specialty Exam: Physical Exam  Review of Systems  Last menstrual period 06/28/2000.There is no height or weight on file to calculate BMI.  General Appearance: NA  Eye Contact:  NA  Speech:  Clear and Coherent  Volume:  Decreased  Mood:  Dysphoric  Affect:  NA  Thought Process:  Goal Directed  Orientation:  Full (Time, Place, and Person)  Thought Content:  WDL  Suicidal Thoughts:  No   Homicidal Thoughts:  No  Memory:  Immediate;   Good Recent;   Good Remote;   Good  Judgement:  Good  Insight:  Present  Psychomotor Activity:  NA  Concentration:  Concentration: Fair and Attention Span: Good  Recall:  Good  Fund of Knowledge:  Good  Language:  Good  Akathisia:  No  Handed:  Right  AIMS (if indicated):     Assets:  Communication Skills Desire for Improvement Housing Social Support  ADL's:  Intact  Cognition:  WNL  Sleep:   good      Assessment and Plan: Major depressive disorder, recurrent.  Generalized anxiety disorder.  She is doing better on her medication.  Continue Cymbalta 60 mg daily, Wellbutrin XL 300 mg daily and Lunesta as needed for insomnia.  She has not taken Xanax which is prescribed by PCP.  Encouraged to continue CPAP and therapy with Florencia Reasons.  Recommended to call us back if she has any question or any concern.  Follow-up in 3 months.  Follow Up Instructions:    I discussed the assessment and treatment plan with the patient. The patient was provided an opportunity to ask questions and all were answered. The patient agreed with the plan and demonstrated an understanding of the instructions.   The patient was advised to call back or seek an in-person evaluation if the symptoms worsen or if the condition fails to improve as anticipated.  I provided 20 minutes of non-face-to-face time during this encounter.  Kathlee Nations, MD

## 2019-10-17 ENCOUNTER — Ambulatory Visit: Payer: Managed Care, Other (non HMO) | Attending: Internal Medicine

## 2019-10-17 DIAGNOSIS — Z23 Encounter for immunization: Secondary | ICD-10-CM

## 2019-10-17 NOTE — Progress Notes (Signed)
   Covid-19 Vaccination Clinic  Name:  [name] [name]    MRN: [id number] DOB: 1965-11-01  10/17/2019  [name] was observed post Covid-19 immunization for 15 minutes without incident. She was provided with Vaccine Information Sheet and instruction to access the V-Safe system.   [name] was instructed to call 911 with any severe reactions post vaccine: Marland Kitchen Difficulty breathing  . Swelling of face and throat  . A fast heartbeat  . A bad rash all over body  . Dizziness and weakness   Immunizations Administered    Name Date Dose VIS Date Route   Pfizer COVID-19 Vaccine 10/17/2019  4:40 PM 0.3 mL 08/22/2018 Intramuscular   Manufacturer: [employer], [employer]   Lot: [id number]   NDC: [address]-4

## 2019-10-22 ENCOUNTER — Other Ambulatory Visit: Payer: Self-pay

## 2019-10-22 ENCOUNTER — Telehealth (INDEPENDENT_AMBULATORY_CARE_PROVIDER_SITE_OTHER): Payer: 59 | Admitting: Psychiatry

## 2019-10-22 DIAGNOSIS — F419 Anxiety disorder, unspecified: Secondary | ICD-10-CM | POA: Diagnosis not present

## 2019-10-22 DIAGNOSIS — F331 Major depressive disorder, recurrent, moderate: Secondary | ICD-10-CM | POA: Diagnosis not present

## 2019-10-22 NOTE — Progress Notes (Signed)
Virtual Visit via Video Note  I connected with [name] [name] on 10/22/19 at 11:10 AM EDT  by a video enabled telemedicine application and verified that I am speaking with the correct person using two identifiers.   I discussed the limitations of evaluation and management by telemedicine and the availability of in person appointments. The patient expressed understanding and agreed to proceed.  I provided 45 minutes of non-face-to-face time during this encounter.   Alonza Smoker, LCSW    THERAPIST PROGRESS NOTE  Session Time:   Monday 10/22/2019  11:10 AM - 11:55 AM   Participation Level: Active  Behavioral Response: CasualAlertAnxious and Depressed/tearful at times  Type of Therapy: Individual Therapy  Treatment Goals addressed:  Patient would like to decrease anxiety related to work and medical stressors to function at her best and advocate for self more assertiveness  Interventions: CBT and Supportive  Summary: [name] [name] is a 54 y.o. female who is referred for sevices for continuity of care by therapist Lise Auer while therapist is on temporary leave. She denies any psychiatric hospitalizations. She has been in outpatient therapy for a year and currently sees psychiatrist Dr. [name] for medication management.  She reports needing help with stress management mainly with work issues.  Patient reports being under a lot of stress due to work demands and says it has implications for her diabetes management.  She reports tendency to internalize thoughts and feelings. She reports frequent worry about not getting information timely from coworkers to perform her job.  Current symptoms include difficulty concentrating, fatigue, thoughts/feelings of hopelessness, irritability, sleep difficulty, tearfulness, fatigue, and excessive worry.      Patient last was seen via virtual visit about a month ago.  She continues to experience mild depression and anxiety.  She is pleased with her  increased self-care efforts regarding exercise.  She has been exercising on the weekends and after work to try to deal with job stress.  She reports walking with friends on a trail 2-3 times a week.  She also has been using an exercise DVD.  Patient reports also taking her taking her 2 fifteen minutes at work.  She continues to experience frustration and sadness regarding her job due to excess job demands.  However, she has made efforts to set boundaries as well as achieve/maintain balance between work and self-care.  She reports trying to find another job but feeling trapped as some potential employers are requiring a degree in her field despite her years of experience.  She reports ruminating thoughts about past choices regarding her last career move and expresses regret/guilt about this along with helplessness/hopelessness about the future for her career.  She continues to worry about possible increased work demands and reports this has affected her physically.   Suicidal/Homicidal: Nowithout intent/plan  Therapist Response: Established rapport, reviewed symptoms, praised and reinforced patient's improved self-care/exercise/contact with friends, praised and reinforced patient's efforts to achieve balance between work and self-care as well as her efforts to set maintain boundaries, discussed stressors, facilitated expression of thoughts and feelings, validated feelings, assisted patient began to examine her thought patterns regarding her current job situation and possibilities for the future, assisted patient identify/challenge/and replace thoughts evoking inappropriate guilt with more helpful thoughts, assisted patient identify strength she has used successfully to manage previous adversity particularly her spirituality, assisted patient identify ways to use spirituality to develop coping statements, developed plan with patient to read coping statements 3 x daily, to continue self-care efforts, to continue  efforts  to set/maintain boundaries   Plan: Return again in 2  weeks.  Diagnosis: Axis I: MDD    Anxiety       [name], LCSW 10/22/2019

## 2019-10-23 ENCOUNTER — Other Ambulatory Visit: Payer: Self-pay | Admitting: Neurology

## 2019-10-23 DIAGNOSIS — G4733 Obstructive sleep apnea (adult) (pediatric): Secondary | ICD-10-CM

## 2019-10-23 DIAGNOSIS — G4711 Idiopathic hypersomnia with long sleep time: Secondary | ICD-10-CM

## 2019-10-23 MED ORDER — AMPHETAMINE-DEXTROAMPHETAMINE 10 MG PO TABS: 10.0000 mg | ORAL_TABLET | Freq: Two times a day (BID) | ORAL | 0 refills | Status: DC

## 2019-10-23 NOTE — Telephone Encounter (Signed)
[place] drug registry has been verified. Last refill was 08/20/2019 # 60 for a 30 day supply. Pt has been compliant with her f/u's Last visit was 09/17/19.

## 2019-10-23 NOTE — Telephone Encounter (Signed)
Pt called stating she is needing a refill on her amphetamine-dextroamphetamine (ADDERALL) 10 MG tablet sent in to the [facility] on [address]

## 2019-11-06 ENCOUNTER — Other Ambulatory Visit: Payer: Self-pay

## 2019-11-06 ENCOUNTER — Ambulatory Visit (INDEPENDENT_AMBULATORY_CARE_PROVIDER_SITE_OTHER): Payer: 59 | Admitting: Psychiatry

## 2019-11-06 DIAGNOSIS — F331 Major depressive disorder, recurrent, moderate: Secondary | ICD-10-CM

## 2019-11-06 DIAGNOSIS — F419 Anxiety disorder, unspecified: Secondary | ICD-10-CM

## 2019-11-06 NOTE — Progress Notes (Signed)
Virtual Visit via Video Note  I connected with [name] [name] on 11/06/19 at  2:00 PM EDT by a video enabled telemedicine application and verified that I am speaking with the correct person using two identifiers.   I discussed the limitations of evaluation and management by telemedicine and the availability of in person appointments. The patient expressed understanding and agreed to proceed.  I provided 43 minutes of non-face-to-face time during this encounter.   [name], LCSW   THERAPIST PROGRESS NOTE  Session Time:   Tuesday 11/06/2019 2:17 PM -  3:00 PM    Participation Level: Active  Behavioral Response: CasualAlertAnxious and Depressed/tearful at times  Type of Therapy: Individual Therapy  Treatment Goals addressed:  Patient would like to decrease anxiety related to work and medical stressors to function at her best and advocate for self more assertiveness  Interventions: CBT and Supportive  Summary: [name] [name] is a 54 y.o. female who is referred for sevices for continuity of care by therapist Hilbert Odor while therapist is on temporary leave. She denies any psychiatric hospitalizations. She has been in outpatient therapy for a year and currently sees psychiatrist [name] for medication management.  She reports needing help with stress management mainly with work issues.  Patient reports being under a lot of stress due to work demands and says it has implications for her diabetes management.  She reports tendency to internalize thoughts and feelings. She reports frequent worry about not getting information timely from coworkers to perform her job.  Current symptoms include difficulty concentrating, fatigue, thoughts/feelings of hopelessness, irritability, sleep difficulty, tearfulness, fatigue, and excessive worry.      Patient last was seen via virtual visit about two weeks ago.  She continues to experience mild depression and anxiety.  She reports coping better  with work stress.  She has been using coping statements she developed using her spirituality and reports this has been helpful.  She reports trying to think less negatively and more positively.  She continues to express frustration management and coworkers do not seem to respect her or treat her in the manner in which she thinks they should.  She has made efforts to continue to use assertiveness skills and set maintain boundaries.   Suicidal/Homicidal: Nowithout intent/plan  Therapist Response: t, reviewed symptoms, praised and reinforced patient's use of coping statements/use of assertiveness skills and setting boundaries, discussed effects, oriented patient to CBT, discussed the connection between thoughts/mood/behavior using examples from patient's life, assisted patient examine her thought patterns, assisted patient identify/challenge/and replace unhelpful thoughts with helpful thoughts regarding interaction with others on her job, assisted patient identify realistic expectations regarding interaction with others and her options regarding her response, will send patient handout in preparation for next session   Plan: Return again in 2  weeks.  Diagnosis: Axis I: MDD    Anxiety       [name], LCSW 11/06/2019

## 2019-11-20 ENCOUNTER — Telehealth (INDEPENDENT_AMBULATORY_CARE_PROVIDER_SITE_OTHER): Payer: 59 | Admitting: Psychiatry

## 2019-11-20 ENCOUNTER — Other Ambulatory Visit: Payer: Self-pay

## 2019-11-20 DIAGNOSIS — F331 Major depressive disorder, recurrent, moderate: Secondary | ICD-10-CM

## 2019-11-20 DIAGNOSIS — F419 Anxiety disorder, unspecified: Secondary | ICD-10-CM

## 2019-11-20 NOTE — Progress Notes (Addendum)
Virtual Visit via Video Note  I connected with [name] [name] on 11/20/19 at 1:05 PM  by a video enabled telemedicine application and verified that I am speaking with the correct person using two identifiers.   I discussed the limitations of evaluation and management by telemedicine and the availability of in person appointments. The patient expressed understanding and agreed to proceed.  I provided 50  minutes of non-face-to-face time during this encounter.   [name], LCSW    THERAPIST PROGRESS NOTE  Location:  Patient  - home  [facility] [place] office  Session Time:   Tuesday 11/20/2019 1:05 PM - 1:55 PM   Participation Level: Active  Behavioral Response: CasualAlertAnxious and Depressed/tearful at times  Type of Therapy: Individual Therapy  Treatment Goals addressed:  Patient would like to decrease anxiety related to work and medical stressors to function at her best and advocate for self more assertiveness  Interventions: CBT and Supportive  Summary: Drake Landing is a 54 y.o. female who is referred for sevices for continuity of care by therapist Hilbert Odor while therapist is on temporary leave. She denies any psychiatric hospitalizations. She has been in outpatient therapy for a year and currently sees psychiatrist [name] [name] for medication management.  She reports needing help with stress management mainly with work issues.  Patient reports being under a lot of stress due to work demands and says it has implications for her diabetes management.  She reports tendency to internalize thoughts and feelings. She reports frequent worry about not getting information timely from coworkers to perform her job.  Current symptoms include difficulty concentrating, fatigue, thoughts/feelings of hopelessness, irritability, sleep difficulty, tearfulness, fatigue, and excessive worry.      Patient last was seen via virtual visit about two weeks ago.  She  experiences  mild depression and anxiety.  She reports becoming more aware of her thinking patterns regarding interaction on her job and has begun to challenge and replace unhelpful thoughts with helpful thoughts.  She also reports having more realistic expectations regarding the interaction. She does not become as angry and does not internalize her feelings like she once did. She also reports feeling less nauseated.  She has become more aware of her options regarding her workday and has used problem solving skills to prioritize her tasks as well as set more realistic expectations of self. She reports decreased rumination and now is sleeping more soundly at night. She states being able to relax more easily. She also reports increased interest in other activities outside of work.    Suicidal/Homicidal: Nowithout intent/plan  Therapist Response: reviewed symptoms, praised and reinforced patient's increased awareness of thought patterns and her efforts to challenge and replace unhelpful thoughts with helpful thoughts, discussed effects, reviewed connection between thoughts/mood/behavior, discussed the role of balance in coping with stress, assisted patient identify relationships and activities that add energy to her life and those that take away energy, discussed ways to find balance, will send patient handouts in preparation for next session (CBT model, unhelpful thinking styles, Socratic questioning)  Plan: Return again in 2  weeks.  Diagnosis: Axis I: MDD    Anxiety       [name], LCSW 11/20/2019

## 2019-12-04 ENCOUNTER — Ambulatory Visit (INDEPENDENT_AMBULATORY_CARE_PROVIDER_SITE_OTHER): Payer: 59 | Admitting: Psychiatry

## 2019-12-04 ENCOUNTER — Other Ambulatory Visit: Payer: Self-pay

## 2019-12-04 DIAGNOSIS — F331 Major depressive disorder, recurrent, moderate: Secondary | ICD-10-CM | POA: Diagnosis not present

## 2019-12-04 NOTE — Progress Notes (Addendum)
Virtual Visit via Video Note  I connected with Gabi Bogle on 12/04/19 at 1:05 PM EDT  by a video enabled telemedicine application and verified that I am speaking with the correct person using two identifiers.   I discussed the limitations of evaluation and management by telemedicine and the availability of in person appointments. The patient expressed understanding and agreed to proceed.  I provided 50  minutes of non-face-to-face time during this encounter.   [name], LCSW     THERAPIST PROGRESS NOTE   Location:  Patient - Home/ Provider - [facility] [place] office  Session Time:   Tuesday 12/04/2019 1:05 PM - 1:55 PM   Participation Level: Active  Behavioral Response: CasualAlert/ euthymic  Type of Therapy: Individual Therapy  Treatment Goals addressed:  Patient would like to decrease anxiety related to work and medical stressors to function at her best and advocate for self more assertiveness  Interventions: CBT and Supportive  Summary: Aniqa Hare is a 54 y.o. female who is referred for sevices for continuity of care by therapist Hilbert Odor while therapist is on temporary leave. She denies any psychiatric hospitalizations. She has been in outpatient therapy for a year and currently sees psychiatrist [name] for medication management.  She reports needing help with stress management mainly with work issues.  Patient reports being under a lot of stress due to work demands and says it has implications for her diabetes management.  She reports tendency to internalize thoughts and feelings. She reports frequent worry about not getting information timely from coworkers to perform her job.  Current symptoms include difficulty concentrating, fatigue, thoughts/feelings of hopelessness, irritability, sleep difficulty, tearfulness, fatigue, and excessive worry.      Patient last was seen via virtual visit about two weeks ago.  She experiences  mild depression and  anxiety.  She reports continued increased awareness of her thinking patterns and continued efforts to challenge and replace unhelpful thoughts with helpful thoughts.  She reports decreased frustration, decreased worry, and decreased anxiety.  She expresses increased acceptance and reports more realistic expectations of her current work environment and coworkers.  She is more hopeful about her future and reports applying for 2 different jobs this past Sunday.  She also enjoyed a recent trip to the mountains and is pleased she was so relaxed and her concentration has improved to the point where she was able to read a novel during her trip.  She continues to maintain positive self-care regarding eating patterns, sleep, and exercise.  She also maintains social involvement with family and friends.   Suicidal/Homicidal: Nowithout intent/plan  Therapist Response: reviewed symptoms, praised and reinforced patient's increased awareness of thought patterns and her efforts to challenge and replace unhelpful thoughts with helpful thoughts, discussed effects, reviewed connection between thoughts/mood/behavior, reviewed CBT model, assisted patient identify her most prevalent unhelpful thinking styles and the effects on her functioning, used Socratic questioning handout to assist patient identify/challenge/and replace unhelpful thoughts with more helpful thoughts, assigned patient to continue using handout to challenge unhelpful thoughts, will send patient thought log in preparation for next session   Plan: Return again in 2  weeks.  Diagnosis: Axis I: MDD    Anxiety       [name], LCSW 12/04/2019

## 2019-12-05 ENCOUNTER — Other Ambulatory Visit: Payer: Self-pay | Admitting: Neurology

## 2019-12-05 DIAGNOSIS — G4711 Idiopathic hypersomnia with long sleep time: Secondary | ICD-10-CM

## 2019-12-05 DIAGNOSIS — G4733 Obstructive sleep apnea (adult) (pediatric): Secondary | ICD-10-CM

## 2019-12-05 MED ORDER — AMPHETAMINE-DEXTROAMPHETAMINE 10 MG PO TABS: 10.0000 mg | ORAL_TABLET | Freq: Two times a day (BID) | ORAL | 0 refills | Status: DC

## 2019-12-05 NOTE — Telephone Encounter (Signed)
[place] drug registry has been verified. Last refill was 10/23/2019 # 60 for a 30 day supply.  Will fwd refill to work in doctor since Dr. Frances Furbish is out until 12/17/2019.

## 2019-12-05 NOTE — Addendum Note (Signed)
Addended by: Ann Maki on: 12/05/2019 01:10 PM   Modules accepted: Orders

## 2019-12-05 NOTE — Telephone Encounter (Signed)
Pt has called for a refill on amphetamine-dextroamphetamine (ADDERALL) 10 MG tablet to [facility] [id number]

## 2019-12-17 ENCOUNTER — Ambulatory Visit (INDEPENDENT_AMBULATORY_CARE_PROVIDER_SITE_OTHER): Payer: 59 | Admitting: Psychiatry

## 2019-12-17 ENCOUNTER — Other Ambulatory Visit: Payer: Self-pay

## 2019-12-17 DIAGNOSIS — F331 Major depressive disorder, recurrent, moderate: Secondary | ICD-10-CM | POA: Diagnosis not present

## 2019-12-17 NOTE — Progress Notes (Signed)
Virtual Visit via Video Note  I connected with [name] [name] on 12/17/19 at 2:05 PM EDT  by a video enabled telemedicine application and verified that I am speaking with the correct person using two identifiers.   I discussed the limitations of evaluation and management by telemedicine and the availability of in person appointments. The patient expressed understanding and agreed to proceed.  I provided 45 minutes of non-face-to-face time during this encounter.   [name], LCSW   THERAPIST PROGRESS NOTE   Location:  Patient - Home/ Provider - [facility] Outpatient  office  Session Time:   Wednesday 12/17/2019 2:05 PM - 2:50 PM  Participation Level: Active  Behavioral Response: CasualAlert/ euthymic  Type of Therapy: Individual Therapy  Treatment Goals addressed:  Patient would like to decrease anxiety related to work and medical stressors to function at her best and advocate for self more assertiveness  Interventions: CBT and Supportive  Summary: [name] [name] is a 54 y.o. female who is referred for sevices for continuity of care by therapist Hilbert Odor while therapist is on temporary leave. She denies any psychiatric hospitalizations. She has been in outpatient therapy for a year and currently sees psychiatrist [name] for medication management.  She reports needing help with stress management mainly with work issues.  Patient reports being under a lot of stress due to work demands and says it has implications for her diabetes management.  She reports tendency to internalize thoughts and feelings. She reports frequent worry about not getting information timely from coworkers to perform her job.  Current symptoms include difficulty concentrating, fatigue, thoughts/feelings of hopelessness, irritability, sleep difficulty, tearfulness, fatigue, and excessive worry.      Patient last was seen via virtual visit about two weeks ago.  She experiences  mild depression and  anxiety.  She reports continued increased awareness of her thinking patterns and continued efforts to challenge and replace unhelpful thoughts with helpful thoughts.  She continues to report decreased frustration, decreased worry, and decreased anxiety.  She reports improved interaction with coworkers as well as increased/improved use of assertiveness skills.  She reports recently enjoying attending a lunch meeting at her job and states feeling more connected.   She continues to maintain positive self-care regarding eating patterns, sleep, and exercise.  She also maintains social involvement with family and friends.   Suicidal/Homicidal: Nowithout intent/plan  Therapist Response: reviewed symptoms, praised and reinforced patient's increased awareness of thought patterns and her efforts to challenge and replace unhelpful thoughts with helpful thoughts, discussed effects, reviewed connection between thoughts/mood/behavior, discussed rationale for and assisted patient practice using thought log, developed plan with patient to continue using thought log and Socratic questioning to identify/challenge/and replace negative thoughts, began to provide psychoeducation on core beliefs/intermediate thoughts-beliefs-assumptions, will send patient handouts in preparation for next session  Plan: Return again in 2  weeks.  Diagnosis: Axis I: MDD    Anxiety       [name], LCSW 12/17/2019

## 2020-01-01 ENCOUNTER — Other Ambulatory Visit: Payer: Self-pay

## 2020-01-01 ENCOUNTER — Encounter (HOSPITAL_COMMUNITY): Payer: Self-pay | Admitting: Psychiatry

## 2020-01-01 ENCOUNTER — Telehealth (INDEPENDENT_AMBULATORY_CARE_PROVIDER_SITE_OTHER): Payer: 59 | Admitting: Psychiatry

## 2020-01-01 VITALS — Wt 141.0 lb

## 2020-01-01 DIAGNOSIS — F331 Major depressive disorder, recurrent, moderate: Secondary | ICD-10-CM

## 2020-01-01 DIAGNOSIS — F5101 Primary insomnia: Secondary | ICD-10-CM

## 2020-01-01 DIAGNOSIS — F419 Anxiety disorder, unspecified: Secondary | ICD-10-CM | POA: Diagnosis not present

## 2020-01-01 MED ORDER — DULOXETINE HCL 60 MG PO CPEP: 60.0000 mg | ORAL_CAPSULE | Freq: Every day | ORAL | 2 refills | Status: DC

## 2020-01-01 MED ORDER — BUPROPION HCL ER (XL) 300 MG PO TB24: 300.0000 mg | ORAL_TABLET | ORAL | 2 refills | Status: DC

## 2020-01-01 NOTE — Progress Notes (Signed)
Virtual Visit via Telephone Note  I connected with [name] [name] on 01/01/20 at  4:20 PM EDT by telephone and verified that I am speaking with the correct person using two identifiers.  Location: Patient: home Provider: Home office   I discussed the limitations, risks, security and privacy concerns of performing an evaluation and management service by telephone and the availability of in person appointments. I also discussed with the patient that there may be a patient responsible charge related to this service. The patient expressed understanding and agreed to proceed.   History of Present Illness: Patient is evaluated by phone session.  She started therapy with Peggy and she is feeling much better.  Other than she is feeling tired some days she described her mood is improved.  She is less depressed and less anxious.  She did go to cousin's house and she had a good time with time.  She is using CPAP every night.  She denies any irritability, anger, mood swings or any anger.  She has no tremors or shakes.  She takes Xanax every day which helps her anxiety and sleep.  She has not taken Lunesta in a while.  She is still struggling with attention and concentration and sometimes forgetful but it is stable.  Her energy level is good.  Her appetite is okay.  She has no tremors shakes.   Past psychiatric history; No h/oinpatient treatment or suicidal attempt. Noh/opsychosis,mania, orabuse. TookWellbutrin prescribed by PCP. Dose increased to 450 few years ago.We tried Lamictal but caused rash. Neurologist had prescribed Adderall to help her memory   Psychiatric Specialty Exam: Physical Exam  Review of Systems  Weight 141 lb (64 kg), last menstrual period 06/28/2000.There is no height or weight on file to calculate BMI.  General Appearance: NA  Eye Contact:  NA  Speech:  Normal Rate  Volume:  Normal  Mood:  tired  Affect:  NA  Thought Process:  Goal Directed  Orientation:  Full  (Time, Place, and Person)  Thought Content:  WDL  Suicidal Thoughts:  No  Homicidal Thoughts:  No  Memory:  Immediate;   Good Recent;   Good Remote;   Good  Judgement:  Intact  Insight:  Good  Psychomotor Activity:  NA  Concentration:  Concentration: Fair and Attention Span: Fair  Recall:  Good  Fund of Knowledge:  Good  Language:  Good  Akathisia:  No  Handed:  Right  AIMS (if indicated):     Assets:  Communication Skills Desire for Improvement Housing Resilience Social Support  ADL's:  Intact  Cognition:  WNL  Sleep:   better      Assessment and Plan: Major depressive disorder, recurrent.  Generalized anxiety disorder.  Patient doing better on medication.  She is not taking Lunesta but taking Xanax prescribed by PCP.  I encouraged therapy with Florencia Reasons which is helping her.  Continue Cymbalta 60 mg daily, Wellbutrin XL 300 mg daily.  Recommended to call us back if she is any question or any concern.  Follow-up in 3 months.  Encouraged to continue to use CPAP to help insomnia.  Follow Up Instructions:    I discussed the assessment and treatment plan with the patient. The patient was provided an opportunity to ask questions and all were answered. The patient agreed with the plan and demonstrated an understanding of the instructions.   The patient was advised to call back or seek an in-person evaluation if the symptoms worsen or if the condition fails  to improve as anticipated.  I provided 15 minutes of non-face-to-face time during this encounter.   Cleotis Nipper, MD

## 2020-01-02 ENCOUNTER — Other Ambulatory Visit (HOSPITAL_COMMUNITY): Payer: Self-pay | Admitting: *Deleted

## 2020-01-02 DIAGNOSIS — F331 Major depressive disorder, recurrent, moderate: Secondary | ICD-10-CM

## 2020-01-02 MED ORDER — DULOXETINE HCL 60 MG PO CPEP: ORAL_CAPSULE | ORAL | 2 refills | Status: DC

## 2020-01-03 ENCOUNTER — Other Ambulatory Visit: Payer: Self-pay

## 2020-01-03 ENCOUNTER — Ambulatory Visit (INDEPENDENT_AMBULATORY_CARE_PROVIDER_SITE_OTHER): Payer: 59 | Admitting: Psychiatry

## 2020-01-03 DIAGNOSIS — F331 Major depressive disorder, recurrent, moderate: Secondary | ICD-10-CM

## 2020-01-03 NOTE — Progress Notes (Signed)
Virtual Visit via Video Note  I connected with [name] on 01/03/20 at 1:07 PM EDT  by a video enabled telemedicine application and verified that I am speaking with the correct person using two identifiers.   I discussed the limitations of evaluation and management by telemedicine and the availability of in person appointments. The patient expressed understanding and agreed to proceed.   I provided 48 minutes of non-face-to-face time during this encounter.   [name], LCSW THERAPIST PROGRESS NOTE   Location:  Patient - Home/ Provider - [facility] Outpatient [place] office  Session Time:   Thursday 01/03/2020 1:07 PM -  1:55 PM  Participation Level: Active  Behavioral Response: CasualAlert/ euthymic  Type of Therapy: Individual Therapy  Treatment Goals addressed:  Patient would like to decrease anxiety related to work and medical stressors to function at her best and advocate for self more assertiveness  Interventions: CBT and Supportive  Summary: [name] is a 54 y.o. female who is referred for sevices for continuity of care by therapist Hilbert Odor while therapist is on temporary leave. She denies any psychiatric hospitalizations. She has been in outpatient therapy for a year and currently sees psychiatrist [name] for medication management.  She reports needing help with stress management mainly with work issues.  Patient reports being under a lot of stress due to work demands and says it has implications for her diabetes management.  She reports tendency to internalize thoughts and feelings. She reports frequent worry about not getting information timely from coworkers to perform her job.  Current symptoms include difficulty concentrating, fatigue, thoughts/feelings of hopelessness, irritability, sleep difficulty, tearfulness, fatigue, and excessive worry.      Patient last was seen via virtual visit about two weeks ago.  She experiences  mild depression and  anxiety.  She reports continued increased awareness of her thinking patterns and continued efforts to challenge and replace unhelpful thoughts with helpful thoughts regarding her job and relationship with co-workers.  She continues to report decreased frustration, decreased worry, and decreased anxiety.  She continues to maintain positive self-care regarding eating patterns, and exercise.  She has experienced increased sleep difficulty but attributes this to low blood sugar and alert on insulin pump beeping at night. She plans to follow up with her doctor regarding this.  She  maintains social involvement with family and friends. She reports enjoying recent dinner with cousin and his family but being very anxious prior to going.  Patient reports she did not receive previously mailed handouts on core beliefs and assumptions.  Suicidal/Homicidal: Nowithout intent/plan  Therapist Response: reviewed symptoms, praised and reinforced patient's increased awareness of thought patterns and her efforts to challenge and replace unhelpful thoughts with helpful thoughts, discussed effects, reviewed connection between thoughts/mood/behavior, used thought log to help patient identify/challenge/and replace anxiety provoking thoughts about dinner with cousin and his family, reviewed rationale for and developed plan for patient to use thought log between sessions  Plan: Return again in 2  weeks.  Diagnosis: Axis I: MDD    Anxiety       [name], LCSW 01/03/2020

## 2020-01-14 ENCOUNTER — Telehealth: Payer: Self-pay | Admitting: Neurology

## 2020-01-14 DIAGNOSIS — G4711 Idiopathic hypersomnia with long sleep time: Secondary | ICD-10-CM

## 2020-01-14 DIAGNOSIS — G4733 Obstructive sleep apnea (adult) (pediatric): Secondary | ICD-10-CM

## 2020-01-14 MED ORDER — AMPHETAMINE-DEXTROAMPHETAMINE 10 MG PO TABS: 10.0000 mg | ORAL_TABLET | Freq: Two times a day (BID) | ORAL | 0 refills | Status: DC

## 2020-01-14 NOTE — Telephone Encounter (Signed)
Pt called needing a refill on her amphetamine-dextroamphetamine (ADDERALL) 10 MG tablet sent in to the [facility] on [address]

## 2020-01-14 NOTE — Telephone Encounter (Signed)
Rx refilled electronically °

## 2020-01-17 ENCOUNTER — Ambulatory Visit (INDEPENDENT_AMBULATORY_CARE_PROVIDER_SITE_OTHER): Payer: 59 | Admitting: Psychiatry

## 2020-01-17 ENCOUNTER — Other Ambulatory Visit: Payer: Self-pay

## 2020-01-17 DIAGNOSIS — F331 Major depressive disorder, recurrent, moderate: Secondary | ICD-10-CM | POA: Diagnosis not present

## 2020-01-17 NOTE — Progress Notes (Signed)
Virtual Visit via Telephone Note  I connected with [name] on 01/17/20 at 3:10 PM EDT by telephone and verified that I am speaking with the correct person using two identifiers.   I discussed the limitations, risks, security and privacy concerns of performing an evaluation and management service by telephone and the availability of in person appointments. I also discussed with the patient that there may be a patient responsible charge related to this service. The patient expressed understanding and agreed to proceed.  I provided 45 minutes of non-face-to-face time during this encounter.   [name], LCSW  THERAPIST PROGRESS NOTE   Location:  Patient - Home/ Provider - [facility] Outpatient [place] office  Session Time:   Thursday 01/17/2020 3:10 PM-  3:55 PM  Participation Level: Active  Behavioral Response: CasualAlert/ euthymic  Type of Therapy: Individual Therapy  Treatment Goals addressed:  Patient would like to decrease anxiety related to work and medical stressors to function at her best and advocate for self more assertiveness  Interventions: CBT and Supportive  Summary: [name] [name] is a 53 y.o. female who is referred for sevices for continuity of care by therapist Hilbert Odor while therapist is on temporary leave. She denies any psychiatric hospitalizations. She has been in outpatient therapy for a year and currently sees psychiatrist [name] for medication management.  She reports needing help with stress management mainly with work issues.  Patient reports being under a lot of stress due to work demands and says it has implications for her diabetes management.  She reports tendency to internalize thoughts and feelings. She reports frequent worry about not getting information timely from coworkers to perform her job.  Current symptoms include difficulty concentrating, fatigue, thoughts/feelings of hopelessness, irritability, sleep difficulty, tearfulness, fatigue,  and excessive worry.      Patient last was seen via virtual visit about two weeks ago.  She experiences  mild depression and anxiety.  She reports continued increased awareness of her thinking patterns and continued efforts to challenge and replace unhelpful thoughts with helpful thoughts regarding her job and relationship with co-workers. She has been using thought log.  She continues to report decreased frustration, decreased worry, and decreased anxiety. She reports being more engaged in life and currently is out of town on vacation.  Suicidal/Homicidal: Nowithout intent/plan  Therapist Response: reviewed symptoms, praised and reinforced patient's increased awareness of thought patterns and her efforts to challenge and replace unhelpful thoughts with helpful thoughts, discussed effects, reviewed connection between thoughts/mood/behavior, processed thought log to help patient identify/challenge/and replace unhelpful thoughts, assisted patient identify/address problems using thought log, discussed connection between automatic thoughts/rules and assumptions/core beliefs, develop plan with patient to continue using thought log for automatic thoughts between sessions, will continue to discuss and rules/assumptions and core beliefs next session, will send patient core beliefs handout in preparation for next session  Plan: Return again in 2  weeks.  Diagnosis: Axis I: MDD    Anxiety       [name], LCSW 01/17/2020

## 2020-01-27 DIAGNOSIS — L811 Chloasma: Secondary | ICD-10-CM

## 2020-01-27 HISTORY — DX: Chloasma: L81.1

## 2020-02-08 ENCOUNTER — Other Ambulatory Visit: Payer: Self-pay

## 2020-02-08 ENCOUNTER — Ambulatory Visit (INDEPENDENT_AMBULATORY_CARE_PROVIDER_SITE_OTHER): Payer: 59 | Admitting: Psychiatry

## 2020-02-08 DIAGNOSIS — F331 Major depressive disorder, recurrent, moderate: Secondary | ICD-10-CM

## 2020-02-08 NOTE — Progress Notes (Signed)
Virtual Visit via Video Note  I connected with [name] [name] on 02/08/20 at 9:07 AM EDT by a video enabled telemedicine application and verified that I am speaking with the correct person using two identifiers.   I discussed the limitations of evaluation and management by telemedicine and the availability of in person appointments. The patient expressed understanding and agreed to proceed.  I provided 53 minutes of non-face-to-face time during this encounter.   [name], LCSW THERAPIST PROGRESS NOTE   Location:  Patient - Home/ Provider - [facility] Outpatient [place] office  Session Time:   Friday 02/08/2020 9:07 AM - 10:00 AM    Participation Level: Active  Behavioral Response: CasualAlert/ euthymic  Type of Therapy: Individual Therapy  Treatment Goals addressed:  Patient would like to decrease anxiety related to work and medical stressors to function at her best and advocate for self more assertiveness  Interventions: CBT and Supportive  Summary: [name] [name] is a 54 y.o. female who is referred for sevices for continuity of care by therapist Hilbert Odor while therapist is on temporary leave. She denies any psychiatric hospitalizations. She has been in outpatient therapy for a year and currently sees psychiatrist [name] for medication management.  She reports needing help with stress management mainly with work issues.  Patient reports being under a lot of stress due to work demands and says it has implications for her diabetes management.  She reports tendency to internalize thoughts and feelings. She reports frequent worry about not getting information timely from coworkers to perform her job.  Current symptoms include difficulty concentrating, fatigue, thoughts/feelings of hopelessness, irritability, sleep difficulty, tearfulness, fatigue, and excessive worry.      Patient last was seen via virtual visit about 2-3 weeks ago.  She experiences  mild depression and anxiety.   She reports continued increased awareness of her thinking patterns and continued efforts to challenge and replace unhelpful thoughts with helpful thoughts regarding her job and relationship with co-workers. She has been using thought log regularly and reports noticing a pattern of mistrust.  She also reports noticing her fear and anxiety about being in social situations as she fears others will be judgmental and critical.  She recognizes pattern of mind reading.  She is very relieved about her job as she recently was informed she will be able to continue to work remotely until sometime next year.  Patient reports continued behavioral activation and is engaging in hobbies such as reading and knitting.  She also is maintaining social involvement and has plans to meet with a friend and his family this weekend.  She Suicidal/Homicidal: Nowithout intent/plan  Therapist Response: reviewed symptoms, praised and reinforced patient's increased awareness of thought patterns and her efforts to challenge and replace unhelpful thoughts with helpful thoughts, discussed effects, began to discuss behavioral targets and thoughts about being assertive, also began to discuss core beliefs and effects on functioning, reviewed treatment plan, obtained patient's permission to initial plan as this was a virtual visit, developed plan with patient to continue using thought logs in preparation for next session   Plan: Return again in 2  weeks.  Diagnosis: Axis I: MDD    Anxiety       [name], LCSW 02/08/2020

## 2020-02-11 ENCOUNTER — Telehealth: Payer: Self-pay | Admitting: Obstetrics and Gynecology

## 2020-02-11 NOTE — Telephone Encounter (Signed)
Please reach out to patient to confirm medical history information received from her insurance company.   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   If this diagnosis is correct, please add it to her medical record, and get a copy of her visit that day.   If she has had a TIA, I recommend she stop her Premarin.  If she has hot flashes, we can treat them with other options.

## 2020-02-11 NOTE — Telephone Encounter (Signed)
Spoke with patient. Patient states she is not aware of any OV or encounter where she was diagnosed with TIA. Patient states she was last seen at [facility] in 05/2018 for hypoglycemic episode, stroke was ruled out. States she was also seen at [facility] in 2018 or 2019 and TIA was also ruled out (04/18/17 ED visit in Care Everywhere). Patient reports she is still taking Premarin 0.625 mg tab daily. Patient denies any hx of TIA.   Patient states she was seen by dermatologist 8/2, treated for melasma with hydroquinone 4% topical cream, was unsure if this could be related to hormones?   Advised I will provide update to Dr. Edward Jolly and return call with any additional recommendations. Patient agreeable.

## 2020-02-11 NOTE — Telephone Encounter (Signed)
Call returned to patient, left detailed message, ok per dpr. Advised per Dr. Edward Jolly. Advised if she would like an OV to further discuss with Dr. Edward Jolly, return call to office at [phone] to schedule.   Encounter closed.

## 2020-02-11 NOTE — Telephone Encounter (Signed)
When I review her history in Care Everywhere, I do not see a TIA.   Hormones to contribute to melasma.

## 2020-02-18 ENCOUNTER — Other Ambulatory Visit: Payer: Self-pay

## 2020-02-18 DIAGNOSIS — G4711 Idiopathic hypersomnia with long sleep time: Secondary | ICD-10-CM

## 2020-02-18 DIAGNOSIS — G4733 Obstructive sleep apnea (adult) (pediatric): Secondary | ICD-10-CM

## 2020-02-18 MED ORDER — AMPHETAMINE-DEXTROAMPHETAMINE 10 MG PO TABS: 10.0000 mg | ORAL_TABLET | Freq: Two times a day (BID) | ORAL | 0 refills | Status: DC

## 2020-02-18 NOTE — Telephone Encounter (Signed)
[place] drug registry has been verified. Last refill was 01/14/2020 # 60 for a 30 day supply.

## 2020-02-18 NOTE — Telephone Encounter (Signed)
Pt left a VM asking for a refill on adderall 10mg  to on [address].

## 2020-02-18 NOTE — Addendum Note (Signed)
Addended by: Ann Maki on: 02/18/2020 01:56 PM   Modules accepted: Orders

## 2020-02-22 ENCOUNTER — Ambulatory Visit (INDEPENDENT_AMBULATORY_CARE_PROVIDER_SITE_OTHER): Payer: 59 | Admitting: Psychiatry

## 2020-02-22 ENCOUNTER — Other Ambulatory Visit: Payer: Self-pay

## 2020-02-22 DIAGNOSIS — F331 Major depressive disorder, recurrent, moderate: Secondary | ICD-10-CM | POA: Diagnosis not present

## 2020-02-22 NOTE — Progress Notes (Signed)
Virtual Visit via Video Note  I connected with [name] [name] on 02/22/20 at 9:10 AM EDT  by a video enabled telemedicine application and verified that I am speaking with the correct person using two identifiers.   I discussed the limitations of evaluation and management by telemedicine and the availability of in person appointments. The patient expressed understanding and agreed to proceed.  I provided 50 minutes of non-face-to-face time during this encounter.   [name], LCSW THERAPIST PROGRESS NOTE   Location:  Patient - Home/ Provider - [facility] Outpatient [place] office  Session Time:   Friday 02/22/2020 9:10  AM - 10:00 AM    Participation Level: Active  Behavioral Response: CasualAlert/ euthymic  Type of Therapy: Individual Therapy  Treatment Goals addressed:  Patient would like to decrease anxiety related to work and medical stressors to function at her best and advocate for self more assertiveness  Interventions: CBT and Supportive  Summary: [name] [name] is a 54 y.o. female who is referred for sevices for continuity of care by therapist Hilbert Odor while therapist is on temporary leave. She denies any psychiatric hospitalizations. She has been in outpatient therapy for a year and currently sees psychiatrist [name] for medication management.  She reports needing help with stress management mainly with work issues.  Patient reports being under a lot of stress due to work demands and says it has implications for her diabetes management.  She reports tendency to internalize thoughts and feelings. She reports frequent worry about not getting information timely from coworkers to perform her job.  Current symptoms include difficulty concentrating, fatigue, thoughts/feelings of hopelessness, irritability, sleep difficulty, tearfulness, fatigue, and excessive worry.      Patient last was seen via virtual visit about 2-3 weeks ago.  She experiences  mild depression and  anxiety.  She reports positive mood, continued involvement in activities, socialization, and positive self-care.  She has initiated more contact with family and friends.  She reports decreased use of Xanax.  She reports increased frustration regarding her job due to recent incident on her job.  She reports trying to use thought log to process her feelings and thoughts.  Suicidal/Homicidal: Nowithout intent/plan  Therapist Response: reviewed symptoms, praised and reinforced patient's continued behavioral activation/socialization/and positive self-care, discussed stressors, facilitated expression of thoughts and feelings, validated feelings, processed patient's homework and assisted patient identify/challenge/and replace cognitive distortions about recent incident at work with healthy alternative, reviewed the effects of should, ought, must statements on mood and behavior, discussed problem solving and assertiveness skills to address incident at work, developed plan with patient to continue using thought logs in preparation for next session, also developed plan with patient to review handout on unhelpful thinking styles, will send patient handout via mail.  Plan: Return again in 2  weeks.  Diagnosis: Axis I: MDD    Anxiety       [name], LCSW 02/22/2020

## 2020-03-07 ENCOUNTER — Ambulatory Visit (INDEPENDENT_AMBULATORY_CARE_PROVIDER_SITE_OTHER): Payer: 59 | Admitting: Psychiatry

## 2020-03-07 ENCOUNTER — Other Ambulatory Visit: Payer: Self-pay

## 2020-03-07 DIAGNOSIS — F331 Major depressive disorder, recurrent, moderate: Secondary | ICD-10-CM

## 2020-03-07 NOTE — Progress Notes (Signed)
Virtual Visit via Video Note  I connected with [name] [name] on 03/07/20 at 10:15 AM EDT  by a video enabled telemedicine application and verified that I am speaking with the correct person using two identifiers.   I discussed the limitations of evaluation and management by telemedicine and the availability of in person appointments. The patient expressed understanding and agreed to proceed.  I provided 45 minutes of non-face-to-face time during this encounter.   [name], LCSW THERAPIST PROGRESS NOTE   Location:  Patient - Home/ Provider - [facility] Out[facility] [place] office  Session Time:   Friday 03/07/2020 10:15 AM - 11:00 AM    Participation Level: Active  Behavioral Response: CasualAlert/ euthymic  Type of Therapy: Individual Therapy  Treatment Goals addressed:  Patient would like to decrease anxiety related to work and medical stressors to function at her best and advocate for self more assertiveness  Interventions: CBT and Supportive  Summary: [name] is a 54 y.o. female who is referred for sevices for continuity of care by therapist Hilbert Odor while therapist is on temporary leave. She denies any psychiatric hospitalizations. She has been in outpatient therapy for a year and currently sees psychiatrist [name] for medication management.  She reports needing help with stress management mainly with work issues.  Patient reports being under a lot of stress due to work demands and says it has implications for her diabetes management.  She reports tendency to internalize thoughts and feelings. She reports frequent worry about not getting information timely from coworkers to perform her job.  Current symptoms include difficulty concentrating, fatigue, thoughts/feelings of hopelessness, irritability, sleep difficulty, tearfulness, fatigue, and excessive worry.      Patient last was seen via virtual visit about 2-3 weeks ago.  She experiences  mild depression and  anxiety.  She reports continued positive mood, involvement in activities, socialization, and positive self-care.  She has been socializing with family and friends, walking, and going on weekend trips.  She reports stress regarding her job but states trying to concentrate more on the positive aspects of her job.  She expresses continued frustration regarding one of her coworkers and cites a recent incident.  However, she is pleased regarding using assertiveness skills and addressing an incident with a another department at her job after doing problem solving and last therapy session.  Patient continues to use thought log.   Suicidal/Homicidal: Nowithout intent/plan  Therapist Response: reviewed symptoms, praised and reinforced patient's continued behavioral activation/socialization/and positive self-care, praised and reinforced patient's use of assertiveness skills and problem solving skills, discussed effects, discussed stressors, facilitated expression of thoughts and feelings, validated feelings, processed patient's homework and assisted patient identify/challenge/and replace cognitive distortions about recent incident with a coworker  with healthy alternative, discussed unhelpful thinking styles and assisted patient identify her patterns, developed plan with patient to continue using thought logs in preparation for next session  Plan: Return again in 2  weeks.  Diagnosis: Axis I: MDD    Anxiety       [name], LCSW 03/07/2020

## 2020-03-21 ENCOUNTER — Other Ambulatory Visit: Payer: Self-pay | Admitting: Neurology

## 2020-03-21 ENCOUNTER — Other Ambulatory Visit: Payer: Self-pay

## 2020-03-21 ENCOUNTER — Ambulatory Visit (INDEPENDENT_AMBULATORY_CARE_PROVIDER_SITE_OTHER): Payer: 59 | Admitting: Psychiatry

## 2020-03-21 DIAGNOSIS — G4711 Idiopathic hypersomnia with long sleep time: Secondary | ICD-10-CM

## 2020-03-21 DIAGNOSIS — F331 Major depressive disorder, recurrent, moderate: Secondary | ICD-10-CM

## 2020-03-21 DIAGNOSIS — G4733 Obstructive sleep apnea (adult) (pediatric): Secondary | ICD-10-CM

## 2020-03-21 NOTE — Progress Notes (Signed)
Virtual Visit via Video Note  I connected with [name] [name] on 03/21/20 at 11:06 AM EDT  by a video enabled telemedicine application and verified that I am speaking with the correct person using two identifiers.   I discussed the limitations of evaluation and management by telemedicine and the availability of in person appointments. The patient expressed understanding and agreed to proceed.  I provided 49  minutes of non-face-to-face time during this encounter.   [name], LCSW HERAPIST PROGRESS NOTE   Location:  Patient - Home/ Provider - [facility] Out[facility] [place] office  Session Time:   Friday 03/21/2020 11:06 AM - 11:55 AM    Participation Level: Active  Behavioral Response: CasualAlert/ euthymic  Type of Therapy: Individual Therapy  Treatment Goals addressed:  Patient would like to decrease anxiety related to work and medical stressors to function at her best and advocate for self more assertiveness  Interventions: CBT and Supportive  Summary: [name] [name] is a 54 y.o. female who is referred for sevices for continuity of care by therapist Hilbert Odor while therapist is on temporary leave. She denies any psychiatric hospitalizations. She has been in outpatient therapy for a year and currently sees psychiatrist [name] for medication management.  She reports needing help with stress management mainly with work issues.  Patient reports being under a lot of stress due to work demands and says it has implications for her diabetes management.  She reports tendency to internalize thoughts and feelings. She reports frequent worry about not getting information timely from coworkers to perform her job.  Current symptoms include difficulty concentrating, fatigue, thoughts/feelings of hopelessness, irritability, sleep difficulty, tearfulness, fatigue, and excessive worry.      Patient last was seen via virtual visit about 2-3 weeks ago.  She reports experiencing increased  symptoms of depression in the past couple of days. She is tearful in session.  She reports increased negative thoughts about self along with feelings of worthlessness.  Trigger appears to be recent incident on job as patient's request was denied.  She reports she has been trying to use thought record between sessions.    Suicidal/Homicidal: Nowithout intent/plan  Therapist Response: reviewed symptoms, praised and reinforced patient's continued efforts to use thought record, assisted patient identify increased symptoms of depression, assisted patient process thought log regarding recent incident at work, began to provide psychoeducation on core beliefs  using core magnet, assisted patient began to identify her core beliefs, assisted patient identify the effects of her core beliefs on her current functioning, developed plan with patient to continue using thought log, will continue to discuss core beliefs at next session, will send patient handouts in preparation for next session, also developed plan with patient to increase behavioral activation and engage in pleasurable activities   Plan: Return again in 2  weeks.  Diagnosis: Axis I: MDD    Anxiety       [name], LCSW 03/21/2020

## 2020-03-21 NOTE — Telephone Encounter (Signed)
Pt is needing a refill on her amphetamine-dextroamphetamine (ADDERALL) 10 MG tablet sent in to the [facility] on [address] 

## 2020-03-24 MED ORDER — AMPHETAMINE-DEXTROAMPHETAMINE 10 MG PO TABS: 10.0000 mg | ORAL_TABLET | Freq: Two times a day (BID) | ORAL | 0 refills | Status: DC

## 2020-03-24 NOTE — Telephone Encounter (Signed)
[place] drug registry has been verified. Last refill was 02/18/2020 # 60 for a 30 day supply.

## 2020-04-01 ENCOUNTER — Telehealth (INDEPENDENT_AMBULATORY_CARE_PROVIDER_SITE_OTHER): Payer: 59 | Admitting: Psychiatry

## 2020-04-01 ENCOUNTER — Encounter (HOSPITAL_COMMUNITY): Payer: Self-pay | Admitting: Psychiatry

## 2020-04-01 ENCOUNTER — Other Ambulatory Visit: Payer: Self-pay

## 2020-04-01 DIAGNOSIS — F331 Major depressive disorder, recurrent, moderate: Secondary | ICD-10-CM | POA: Diagnosis not present

## 2020-04-01 MED ORDER — DULOXETINE HCL 60 MG PO CPEP: ORAL_CAPSULE | ORAL | 2 refills | Status: DC

## 2020-04-01 MED ORDER — BUPROPION HCL ER (XL) 300 MG PO TB24: 300.0000 mg | ORAL_TABLET | ORAL | 2 refills | Status: DC

## 2020-04-01 NOTE — Progress Notes (Signed)
Virtual Visit via Telephone Note  I connected with [name] [name] on 04/01/20 at 10:20 AM EDT by telephone and verified that I am speaking with the correct person using two identifiers.  Location: Patient: Home Provider: Home Office   I discussed the limitations, risks, security and privacy concerns of performing an evaluation and management service by telephone and the availability of in person appointments. I also discussed with the patient that there may be a patient responsible charge related to this service. The patient expressed understanding and agreed to proceed.   History of Present Illness: Patient is evaluated by phone session.  She is doing better with the therapy and like [facility].  She endorsed lately not using CPAP machine every night because she had melasma and taking cream back some time messes up with the mask.  She endorsed there are nights when she has to take [facility] for sleep.  Otherwise she feels her mood anxiety is much better with Wellbutrin and Cymbalta.  She is also prescribed Adderall by neurologist.  Sometimes she struggles with attention but there has been no major issue.  She is working from home.  She is working for [employer] but virtually.  She enjoys reading book and when she has time she buy books.  Overall she feels her anxiety and depression is better and she like to keep her current medication.  She is looking for a new PCP but currently her endocrinologist is prescribing the medication.  Her last hemoglobin A1c is 6.6.   Past psychiatric history; No h/oinpatient treatment or suicidal attempt. Noh/opsychosis,mania, orabuse. TookWellbutrin prescribed by PCP. Dose increased to 450 few years ago.We tried Lamictal but caused rash. Neurologist had prescribed Adderall to help her memory   Psychiatric Specialty Exam: Physical Exam  Review of Systems  Weight 144 lb (65.3 kg), last menstrual period 06/28/2000.There is no  height or weight on file to calculate BMI.  General Appearance: NA  Eye Contact:  NA  Speech:  Clear and Coherent  Volume:  Normal  Mood:  Anxious  Affect:  NA  Thought Process:  Goal Directed  Orientation:  Full (Time, Place, and Person)  Thought Content:  WDL  Suicidal Thoughts:  No  Homicidal Thoughts:  No  Memory:  Immediate;   Good Recent;   Good Remote;   Good  Judgement:  Good  Insight:  Present  Psychomotor Activity:  NA  Concentration:  Concentration: Good and Attention Span: Good  Recall:  Good  Fund of Knowledge:  Good  Language:  Good  Akathisia:  No  Handed:  Right  AIMS (if indicated):     Assets:  Communication Skills Desire for Improvement Housing Resilience  ADL's:  Intact  Cognition:  WNL  Sleep:   ok      Assessment and Plan: Major depressive disorder, recurrent.  Generalized anxiety disorder.  Patient is a stable on medication and therapy.  Continue Cymbalta 60 mg daily, Wellbutrin XL 300 mg daily and encouraged to continue therapy with Peggy Bynum.  Recommend to use CPAP every night since that helps her sleep and she may not need Lunesta.  Recommended to call us back if she has any question or any concern.  Follow-up in 3 months.  Follow Up Instructions:    I discussed the assessment and treatment plan with the patient. The patient was provided an opportunity to ask questions and all were answered. The patient agreed with the plan and demonstrated an understanding of the instructions.  The patient was advised to call back or seek an in-person evaluation if the symptoms worsen or if the condition fails to improve as anticipated.  I provided 21 minutes of non-face-to-face time during this encounter.   Cleotis Nipper, MD

## 2020-04-04 ENCOUNTER — Other Ambulatory Visit: Payer: Self-pay

## 2020-04-04 ENCOUNTER — Ambulatory Visit (INDEPENDENT_AMBULATORY_CARE_PROVIDER_SITE_OTHER): Payer: 59 | Admitting: Psychiatry

## 2020-04-04 DIAGNOSIS — F331 Major depressive disorder, recurrent, moderate: Secondary | ICD-10-CM

## 2020-04-04 NOTE — Progress Notes (Signed)
Virtual Visit via Video Note  I connected with [name] [name] on 04/04/20 at 9:10 AM EDT  by a video enabled telemedicine application and verified that I am speaking with the correct person using two identifiers.   I discussed the limitations of evaluation and management by telemedicine and the availability of in person appointments. The patient expressed understanding and agreed to proceed.  I provided 49 minutes of non-face-to-face time during this encounter.   [name], LCSW  HERAPIST PROGRESS NOTE   Location:  Patient - Home/ Provider - [facility] Outpatient [place] office  Session Time:   Friday 04/04/2020 11:06 AM - 11:55 AM    Participation Level: Active  Behavioral Response: CasualAlert/depressed  Type of Therapy: Individual Therapy  Treatment Goals addressed:  Patient would like to decrease anxiety related to work and medical stressors to function at her best and advocate for self more assertiveness  Interventions: CBT and Supportive  Summary: [name] [name] is a 54 y.o. female who is referred for sevices for continuity of care by therapist Hilbert Odor while therapist is on temporary leave. She denies any psychiatric hospitalizations. She has been in outpatient therapy for a year and currently sees psychiatrist [name] for medication management.  She reports needing help with stress management mainly with work issues.  Patient reports being under a lot of stress due to work demands and says it has implications for her diabetes management.  She reports tendency to internalize thoughts and feelings. She reports frequent worry about not getting information timely from coworkers to perform her job.  Current symptoms include difficulty concentrating, fatigue, thoughts/feelings of hopelessness, irritability, sleep difficulty, tearfulness, fatigue, and excessive worry.      Patient last was seen via virtual visit about 2-3 weeks ago.  She continues to experience symptoms of  depression including diminished interest/pleasure in activities, decreased socialization, sleep difficulty, and ruminating thoughts.  She continues to work and does read.  However, she reports leaving her home only once since last session.  She continues to ruminate about situational her job and reports negative thoughts about self.    Suicidal/Homicidal: Nowithout intent/plan  Therapist Response: reviewed symptoms, discussed stressors, facilitated expression of thoughts and feelings, validated feelings, discussed lapse versus relapse of depression,  discussed early warning signs and ways to intervene, reviewed the connection between thoughts/mood/behavior, discussed the role of behavioral activation in overcoming depression, developed plan with patient to increase behavioral activation through activities related to self-care, social involvement, and relaxation techniques, will send patient handout on early warning signs of depression  Plan: Return again in 2  weeks.  Diagnosis: Axis I: MDD    Anxiety       [name], LCSW 04/04/2020

## 2020-04-18 ENCOUNTER — Other Ambulatory Visit: Payer: Self-pay

## 2020-04-18 ENCOUNTER — Ambulatory Visit (INDEPENDENT_AMBULATORY_CARE_PROVIDER_SITE_OTHER): Payer: 59 | Admitting: Psychiatry

## 2020-04-18 DIAGNOSIS — F331 Major depressive disorder, recurrent, moderate: Secondary | ICD-10-CM

## 2020-04-18 NOTE — Progress Notes (Signed)
Virtual Visit via Video Note  I connected with [name] [name] on 04/18/20 at 9:06 AM EDT  by a video enabled telemedicine application and verified that I am speaking with the correct person using two identifiers.  Location: Patient: Home Provider: [facility] Out[facility] [place] office    I discussed the limitations of evaluation and management by telemedicine and the availability of in person appointments. The patient expressed understanding and agreed to proceed.   I discussed the assessment and treatment plan with the patient. The patient was provided an opportunity to ask questions and all were answered. The patient agreed with the plan and demonstrated an understanding of the instructions.   I provided of non-face-to-face time during this encounter.   [name] Salvage, LCSW    THERAPIST PROGRESS NOTE   Location:  Patient - Home/ Provider - [facility] [place] office  Session Time:   Friday 04/18/2020 9:06 AM - 9:53 AM    Participation Level: Active  Behavioral Response: CasualAlert/ less depressed  Type of Therapy: Individual Therapy  Treatment Goals addressed:  Patient would like to decrease anxiety related to work and medical stressors to function at her best and advocate for self more assertiveness  Interventions: CBT and Supportive  Summary: [name] [name] is a 54 y.o. female who is referred for sevices for continuity of care by therapist [name] [name] while therapist is on temporary leave. She denies any psychiatric hospitalizations. She has been in outpatient therapy for a year and currently sees psychiatrist [name] [name] for medication management.  She reports needing help with stress management mainly with work issues.  Patient reports being under a lot of stress due to work demands and says it has implications for her diabetes management.  She reports tendency to internalize thoughts and feelings. She reports frequent worry about not getting information timely  from coworkers to perform her job.  Current symptoms include difficulty concentrating, fatigue, thoughts/feelings of hopelessness, irritability, sleep difficulty, tearfulness, fatigue, and excessive worry.      Patient last was seen via virtual visit about 2-3 weeks ago.  She reports decreased intensity of symptoms of depression and increased involvement in activity as well as socialization.  She reports walking for exercise and completing various household projects such as cleaning out her closets.  She also reports trying to compartmentalize issues with work and focusing on other activities after her work day.  She also is pleased with support from friends and her efforts in asking them for help.     Suicidal/Homicidal: Nowithout intent/plan  Therapist Response: reviewed symptoms, praised and reinforced patient's increased involvement in activity/socialization, discussed effects, reviewed lapse versus relapse of depression and assisted patient identify triggers of recent lapse, provided psychoeducation on the vicious cycle of depression, discussed the role of behavioral activation/fun and achievement in overcoming depression, assisted patient identify early warning signs of depression and ways to intervene to avoid relapse, will send patient handouts in mail, assisted patient identify ways to maintain consistent behavioral activation, develop plan with patient to use daily planning   Plan: Return again in 2  weeks.  Diagnosis: Axis I: MDD    Anxiety       [name] Salvage, LCSW 04/18/2020

## 2020-05-02 ENCOUNTER — Ambulatory Visit (HOSPITAL_COMMUNITY): Payer: 59 | Admitting: Psychiatry

## 2020-05-02 ENCOUNTER — Other Ambulatory Visit: Payer: Self-pay

## 2020-05-02 ENCOUNTER — Telehealth (HOSPITAL_COMMUNITY): Payer: Self-pay | Admitting: Psychiatry

## 2020-05-02 NOTE — Telephone Encounter (Signed)
Called to reschedule patient due to provider being out, left voicemail

## 2020-05-14 ENCOUNTER — Ambulatory Visit (INDEPENDENT_AMBULATORY_CARE_PROVIDER_SITE_OTHER): Payer: 59 | Admitting: Psychiatry

## 2020-05-14 ENCOUNTER — Other Ambulatory Visit: Payer: Self-pay | Admitting: Neurology

## 2020-05-14 ENCOUNTER — Other Ambulatory Visit: Payer: Self-pay

## 2020-05-14 DIAGNOSIS — F331 Major depressive disorder, recurrent, moderate: Secondary | ICD-10-CM | POA: Diagnosis not present

## 2020-05-14 DIAGNOSIS — G4733 Obstructive sleep apnea (adult) (pediatric): Secondary | ICD-10-CM

## 2020-05-14 DIAGNOSIS — Z9989 Dependence on other enabling machines and devices: Secondary | ICD-10-CM

## 2020-05-14 DIAGNOSIS — G4711 Idiopathic hypersomnia with long sleep time: Secondary | ICD-10-CM

## 2020-05-14 MED ORDER — AMPHETAMINE-DEXTROAMPHETAMINE 10 MG PO TABS: 10.0000 mg | ORAL_TABLET | Freq: Two times a day (BID) | ORAL | 0 refills | Status: DC

## 2020-05-14 NOTE — Telephone Encounter (Signed)
Pt request refill amphetamine-dextroamphetamine (ADDERALL) 10 MG tablet at [facility] [address] 

## 2020-05-14 NOTE — Telephone Encounter (Signed)
Pt is up to date on her appts. Pt is due for a refill on her adderall. [place] Controlled Substance Registry checked and is appropriate. 

## 2020-05-14 NOTE — Progress Notes (Signed)
Virtual Visit via Video Note  I connected with [name] [name] on 05/14/20 at 11:10 AM EST by a video enabled telemedicine application and verified that I am speaking with the correct person using two identifiers.  Location: Patient: Home Provider: [facility] Outpatient [place] office    I discussed the limitations of evaluation and management by telemedicine and the availability of in person appointments. The patient expressed understanding and agreed to proceed.   I provided 45 minutes of non-face-to-face time during this encounter.   [name], LCSW  THERAPIST PROGRESS NOTE   Location:  Patient - Home/ Provider - [facility] Outpatient [place] office  Session Time:   Wednesday 05/14/2020 11:10 AM - 11:55 AM   Participation Level: Active  Behavioral Response: CasualAlert/ less depressed  Type of Therapy: Individual Therapy  Treatment Goals addressed:  Patient would like to decrease anxiety related to work and medical stressors to function at her best and advocate for self more assertiveness  Interventions: CBT and Supportive  Summary: [name] [name] is a 54 y.o. female who is referred for sevices for continuity of care by therapist Hilbert Odor while therapist is on temporary leave. She denies any psychiatric hospitalizations. She has been in outpatient therapy for a year and currently sees psychiatrist [name] for medication management.  She reports needing help with stress management mainly with work issues.  Patient reports being under a lot of stress due to work demands and says it has implications for her diabetes management.  She reports tendency to internalize thoughts and feelings. She reports frequent worry about not getting information timely from coworkers to perform her job.  Current symptoms include difficulty concentrating, fatigue, thoughts/feelings of hopelessness, irritability, sleep difficulty, tearfulness, fatigue, and excessive worry.      Patient last was  seen via virtual visit about 3-4  weeks ago.  She reports increased stress and symptoms of depression since last session.  Per her report, one of her coworkers went out on short-term disability about a week ago.  This has resulted in patient having more work demands to cover coworkers workload, is using assertiveness skills and trying to set and maintain limits regarding work demands.  She also reports having more realistic expectations of self.  She reports being very drained and tired at the end of the day.  She reports depressed mood, decreased involvement and activity, excessive sleeping, avoiding phone calls from friends, and ruminating thoughts about her job.  She reports trying to "numb"  her feelings to try to be able to cope.  She expresses hurt, anger, and resentment regarding the way she is treated on her job.     Suicidal/Homicidal: Nowithout intent/plan  Therapist Response: reviewed symptoms, discussed stressors, facilitated expression of thoughts and feelings, validated feelings, praised and reinforced patient's efforts to use assertiveness skills/set maintain limits, reviewed the role of behavioral activation in overcoming and coping with depression, assisted patient identify activities consistent with her values to increase behavioral activation, developed plan with patient to do yoga 30 minutes 2 times per week, also developed plan with patient to use daily planning and pursue 1 pleasurable activity each day such as contacting friends/engaging in self nurture activities, used cognitive defusion to assist patient cope with thoughts that may inhibit implementation of plan  Plan: Return again in 2  weeks.  Diagnosis: Axis I: MDD    Anxiety       [name], LCSW 05/14/2020

## 2020-05-16 ENCOUNTER — Ambulatory Visit (HOSPITAL_COMMUNITY): Payer: 59 | Admitting: Psychiatry

## 2020-05-19 NOTE — Progress Notes (Signed)
54 y.o. G0P0000 Divorced [place] American female here for annual exam.    Doing well with reducing her estrogen.  No hot flashes.  Feels more cold.   A1C is 6.4.  Loving working remotely.   Not sexually active in the last year.  Neg STD testing last year.   Will do her booster for Covid vaccine tonight.  Received her flu vaccine.  ZOX:[id number] [place], [place]  Patient's last menstrual period was 06/28/2000.           Sexually active: No.  The current method of family planning is status post hysterectomy.    Exercising: No.  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 02/2013 Ascus with Neg HR HPV MMG: 07-23-19 Diag.Lt..w/Lt.US--Small mass Lt.Br.unchanged/density B/BiRads3/Diag.Bil.w/Lt.Br.US in 1 yr Colonoscopy: 05-02-13 normal;next 04/2020--pt. Will call to schedule with Dr. Loreta Ave. BMD:   n/a  Result  n/a TDaP: 03-09-13 Gardasil:   no HIV: 05-21-19 NR Hep C: 05-21-19 Neg Screening Labs:  PCP.   reports that she has never smoked. She has never used smokeless tobacco. She reports that she does not drink alcohol and does not use drugs.  Past Medical History:  Diagnosis Date  . Breast cyst    left  . 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 Diabetes mellitus   . Dry eyes, bilateral 2018  . Fall at home 05/2018   injured low back/spine/sciatic nerve--had 2 spinal injections  . Ketoacidosis 0981,1914  . Melasma 01/2020  . Meningitis 06/2001   --hospital  . Rheumatoid arthritis (HCC) 2014  . Sleep apnea 2013  . Sleep apnea 2011  . 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 Stress   . Thyroid disease    hypothyroidism  . VAIN I (vaginal  intraepithelial neoplasia grade I) 2013    Past Surgical History:  Procedure Laterality Date  . ABDOMINAL HYSTERECTOMY     2004  . CERVICAL BIOPSY  W/ LOOP ELECTRODE EXCISION  01-24-95   CIN I  . COLPOSCOPY  11-21-02   CIN III  . COLPOSCOPY  05-05-12   no lesions--needs repeat pap 08/2012 per Dr. Tresa Res  . HAND SURGERY  2004   tore tendon  . positive HPV  03/21/15   normal pap and negative types 16/18  . SOFT TISSUE CYST EXCISION  12/2010   left breast epidermoid inclusion cyst  . SPINE SURGERY  05/21/14  . TONSILLECTOMY AND ADENOIDECTOMY      Current Outpatient Medications  Medication Sig Dispense Refill  . ALPRAZolam (XANAX) 0.5 MG tablet Take 0.25 mg by mouth as needed for anxiety.     Marland Kitchen amphetamine-dextroamphetamine (ADDERALL) 10 MG tablet Take 1 tablet (10 mg total) by mouth 2 (two) times daily. 60 tablet 0  . Ascorbic Acid (VITAMIN C) 1000 MG tablet Take 1,000 mg by mouth daily.    . Biotin 10 MG CAPS Take 1 capsule by mouth daily.    Marland Kitchen buPROPion (WELLBUTRIN XL) 300 MG 24 hr tablet Take  1 tablet (300 mg total) by mouth every morning. 30 tablet 2  . Continuous Blood Gluc Transmit (DEXCOM G6 TRANSMITTER) MISC CHANGE EVERY 3 MONTHS TO MONITOR BLOOD GLUCOSE CONTINUOUSLY    . DULoxetine (CYMBALTA) 60 MG capsule Take one capsule (60mg  total) by mouth daily. 30 capsule 2  . estrogens, conjugated, (PREMARIN) 0.625 MG tablet Take 1 tablet (0.625 mg total) by mouth daily. 90 tablet 3  . eszopiclone (LUNESTA) 2 MG TABS tablet TAKE ONE TABLET BY MOUTH IMMEDIATELY BEFORE BEDTIME AS NEEDED FOR SLEEP 30 tablet 0  . hydroquinone 4 % cream SMARTSIG:1 Topical Every Night    . Insulin Human (INSULIN PUMP) SOLN Inject 25 each into the skin as directed. Throughout the day (Per BS)    . insulin lispro (HUMALOG) 100 UNIT/ML injection Inject 25 Units into the skin as needed for high blood sugar.     . levothyroxine (SYNTHROID, LEVOTHROID) 137 MCG tablet Take 137 mcg by mouth daily.    . Vitamin D,  Ergocalciferol, (DRISDOL) 1.25 MG (50000 UNIT) CAPS capsule Take 50,000 Units by mouth once a week.    VITAMIN E PO Take 1 capsule by mouth daily.    Marland Kitchen XIIDRA 5 % SOLN Apply to eye 2 (two) times daily.     No current facility-administered medications for this visit.    Family History  Problem Relation Age of Onset  . Other Mother        cysts  . Diabetes Father   . Kidney disease Father   . Stroke Paternal Aunt   . Breast cancer Neg Hx     Review of Systems  All other systems reviewed and are negative.   Exam:   BP 128/64   Pulse 95   Ht 5\' 6"  (1.676 m)   Wt 147 lb (66.7 kg)   LMP 06/28/2000   SpO2 100%   BMI 23.73 kg/m     General appearance: alert, cooperative and appears stated age Head: normocephalic, without obvious abnormality, atraumatic Neck: no adenopathy, supple, symmetrical, trachea midline and thyroid normal to inspection and palpation Lungs: clear to auscultation bilaterally Breasts: normal appearance, no masses or tenderness, No nipple retraction or dimpling, No nipple discharge or bleeding, No axillary adenopathy Heart: regular rate and rhythm Abdomen: soft, non-tender; no masses, no organomegaly Extremities: extremities normal, atraumatic, no cyanosis or edema Skin: skin color, texture, turgor normal. No rashes or lesions Lymph nodes: cervical, supraclavicular, and axillary nodes normal. Neurologic: grossly normal  Pelvic: External genitalia:  Increased pigmentation of the perineum              No abnormal inguinal nodes palpated.              Urethra:  normal appearing urethra with no masses, tenderness or lesions              Bartholins and Skenes: normal                 Vagina: normal appearing vagina with normal color and discharge, no lesions              Cervix: absent              Pap taken: Yes.   Bimanual Exam:  Uterus:  absent              Adnexa: no mass, fullness, tenderness              Rectal exam: Yes.  .  Confirms.  Anus:  normal sphincter tone, no lesions  Chaperone was present for exam.  Assessment:   Well woman visit with normal exam. Status post TVH.  History of CIN III and VAIN I.  Pap2016normal and positive HR HPV, negative 16/18. ERT patient.   HSV. Not taking antiviral medication. DM. Varicose veins. Hyperpigmentation of the vulva.  May be a new change.  Plan: Mammogram screening discussed. Self breast awareness reviewed. Pap and HR HPV as above. Guidelines for Calcium, Vitamin D, regular exercise program including cardiovascular and weight bearing exercise. Reduce Premarin to 0.45 mg daily.  I discussed potential risk of stroke, DVT, and PE with taking postmenopausal estrogen. Compression hose discussed.  She will let me know if she needs an Rx.  Return for vulvar biopsy. Follow up annually and prn.

## 2020-05-29 ENCOUNTER — Ambulatory Visit: Payer: Managed Care, Other (non HMO) | Admitting: Obstetrics and Gynecology

## 2020-05-29 ENCOUNTER — Encounter: Payer: Self-pay | Admitting: Obstetrics and Gynecology

## 2020-05-29 ENCOUNTER — Other Ambulatory Visit: Payer: Self-pay

## 2020-05-29 ENCOUNTER — Other Ambulatory Visit (HOSPITAL_COMMUNITY)
Admission: RE | Admit: 2020-05-29 | Discharge: 2020-05-29 | Disposition: A | Discharge status: Home / Self Care | Payer: Managed Care, Other (non HMO) | Source: Ambulatory Visit | Attending: Obstetrics and Gynecology | Admitting: Obstetrics and Gynecology

## 2020-05-29 VITALS — BP 128/64 | HR 95 | Ht 66.0 in | Wt 147.0 lb

## 2020-05-29 DIAGNOSIS — Z01419 Encounter for gynecological examination (general) (routine) without abnormal findings: Secondary | ICD-10-CM

## 2020-05-29 DIAGNOSIS — L819 Disorder of pigmentation, unspecified: Secondary | ICD-10-CM | POA: Diagnosis not present

## 2020-05-29 MED ORDER — ESTROGENS CONJUGATED 0.45 MG PO TABS
0.4500 mg | ORAL_TABLET | Freq: Every day | ORAL | 3 refills | Status: DC
Start: 2020-05-29 — End: 2020-06-18

## 2020-05-29 NOTE — Patient Instructions (Signed)

## 2020-06-03 LAB — CYTOLOGY - PAP
Comment: NEGATIVE
Diagnosis: UNDETERMINED — AB
High risk HPV: NEGATIVE

## 2020-06-05 ENCOUNTER — Telehealth: Payer: Self-pay

## 2020-06-05 NOTE — Telephone Encounter (Signed)
Call placed to convey benefits for vulva biopsy. Spoke with the patient and conveyed the benefits. Patient understands/agreeable with the benefits. Patient is aware of the cancellation policy. Appointment scheduled 06/06/20.

## 2020-06-06 ENCOUNTER — Telehealth: Payer: Self-pay

## 2020-06-06 ENCOUNTER — Ambulatory Visit (INDEPENDENT_AMBULATORY_CARE_PROVIDER_SITE_OTHER): Payer: Managed Care, Other (non HMO) | Admitting: Obstetrics and Gynecology

## 2020-06-06 ENCOUNTER — Other Ambulatory Visit (HOSPITAL_COMMUNITY)
Admission: RE | Admit: 2020-06-06 | Discharge: 2020-06-06 | Disposition: A | Discharge status: Home / Self Care | Payer: Managed Care, Other (non HMO) | Source: Ambulatory Visit | Attending: Obstetrics and Gynecology | Admitting: Obstetrics and Gynecology

## 2020-06-06 ENCOUNTER — Encounter: Payer: Self-pay | Admitting: Obstetrics and Gynecology

## 2020-06-06 ENCOUNTER — Other Ambulatory Visit: Payer: Self-pay

## 2020-06-06 DIAGNOSIS — L819 Disorder of pigmentation, unspecified: Secondary | ICD-10-CM | POA: Insufficient documentation

## 2020-06-06 NOTE — Progress Notes (Signed)
GYNECOLOGY  VISIT   HPI: 54 y.o.   Divorced  [place]  female   G0P0000 with Patient's last menstrual period was 06/28/2000.   here for vulvar biopsy    She was noted to have increased pigmentation of her perineum at the time of her annual exam on 05/29/20.  This had not been noted on previous annual exams.  She has melasma of her face and sees dermatology.  She also notes some new red areas of the skin of her abdomen.   GYNECOLOGIC HISTORY: Patient's last menstrual period was 06/28/2000. Contraception:  hysterectomy Menopausal hormone therapy:  Premarin Last mammogram:  07-23-19 Diag.Lt..w/Lt.US--Small mass Lt.Br.unchanged/density B/BiRads3/Diag.Bil.w/Lt.Br.US in 1 yr Last pap smear:   05/29/20 ASCUS:Neg HR HPV        OB History    Gravida  0   Para  0   Term  0   Preterm  0   AB  0   Living  0     SAB  0   IAB  0   Ectopic  0   Multiple  0   Live Births                 Patient Active Problem List   Diagnosis Date Noted  . Confusion 11/28/2017  . Dizziness 11/28/2017  . Insulin pump in place 09/22/2017  . Cellulitis 09/21/2017  . Diabetic ketoacidosis without coma associated with diabetes mellitus due to underlying condition (HCC) 09/21/2017  . AKI (acute kidney injury) (HCC) 09/20/2017  . Hyperglycemia 09/20/2017  . Hypotension 09/20/2017  . Sepsis (HCC) 09/20/2017  . Intermittent confusion 04/18/2017  . OSA (obstructive sleep apnea) 04/18/2017  . HYPOTHYROIDISM 09/21/2007  . DIABETES MELLITUS, TYPE I 09/21/2007  . ANXIETY DEPRESSION 09/21/2007  . NAUSEA 09/21/2007    Past Medical History:  Diagnosis Date  . Breast cyst    left  . 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 Diabetes mellitus   . Dry eyes, bilateral 2018  . Fall at home 05/2018   injured low back/spine/sciatic nerve--had 2 spinal injections  . Ketoacidosis [id number]  . Melasma 01/2020  . Meningitis 06/2001   --hospital  . Rheumatoid arthritis (HCC)  2014  . Sleep apnea 2013  . Sleep apnea 2011  . 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 Stress   . Thyroid disease    hypothyroidism  . VAIN I (vaginal intraepithelial neoplasia grade I) 2013    Past Surgical History:  Procedure Laterality Date  . ABDOMINAL HYSTERECTOMY     2004  . CERVICAL BIOPSY  W/ LOOP ELECTRODE EXCISION  01-24-95   CIN I  . COLPOSCOPY  11-21-02   CIN III  . COLPOSCOPY  05-05-12   no lesions--needs repeat pap 08/2012 per Dr. Tresa Res  . HAND SURGERY  2004   tore tendon  . positive HPV  03/21/15   normal pap and negative types 16/18  . SOFT TISSUE CYST EXCISION  12/2010   left breast epidermoid inclusion cyst  . SPINE SURGERY  05/21/14  . TONSILLECTOMY AND ADENOIDECTOMY      Current Outpatient Medications  Medication Sig Dispense Refill  . ALPRAZolam (XANAX) 0.5 MG tablet Take 0.25 mg by mouth as needed for anxiety.    Marland Kitchen amphetamine-dextroamphetamine (ADDERALL) 10 MG tablet Take 1 tablet (10 mg total) by mouth 2 (two) times daily. 60 tablet 0  . Ascorbic Acid (VITAMIN C) 1000 MG tablet Take 1,000 mg by mouth daily.    Marland Kitchen  Biotin 10 MG CAPS Take 1 capsule by mouth daily.    Marland Kitchen buPROPion (WELLBUTRIN XL) 300 MG 24 hr tablet Take 1 tablet (300 mg total) by mouth every morning. 30 tablet 2  . Continuous Blood Gluc Transmit (DEXCOM G6 TRANSMITTER) MISC CHANGE EVERY 3 MONTHS TO MONITOR BLOOD GLUCOSE CONTINUOUSLY    . DULoxetine (CYMBALTA) 60 MG capsule Take one capsule (60mg  total) by mouth daily. 30 capsule 2  . estrogens, conjugated, (PREMARIN) 0.45 MG tablet Take 1 tablet (0.45 mg total) by mouth daily. 90 tablet 3  . eszopiclone (LUNESTA) 2 MG TABS tablet TAKE ONE TABLET BY MOUTH IMMEDIATELY BEFORE BEDTIME AS NEEDED FOR SLEEP 30 tablet 0  . hydroquinone 4 % cream SMARTSIG:1 Topical Every Night    . Insulin Human (INSULIN PUMP) SOLN Inject 25 each into the skin as directed. Throughout the day (Per BS)    . insulin lispro (HUMALOG) 100 UNIT/ML injection  Inject 25 Units into the skin as needed for high blood sugar.     . levothyroxine (SYNTHROID, LEVOTHROID) 137 MCG tablet Take 137 mcg by mouth daily.    . Vitamin D, Ergocalciferol, (DRISDOL) 1.25 MG (50000 UNIT) CAPS capsule Take 50,000 Units by mouth once a week.    . Vitamin E 400 units TABS Take 1 capsule by mouth daily.    XIIDRA 5 % SOLN Apply to eye 2 (two) times daily.     No current facility-administered medications for this visit.     ALLERGIES: Bee venom, Penicillins, Sulfa antibiotics, and Lamictal [lamotrigine]  Family History  Problem Relation Age of Onset  . Other Mother        cysts  . Diabetes Father   . Kidney disease Father   . Stroke Paternal Aunt   . Breast cancer Neg Hx     Social History   Socioeconomic History  . Marital status: Divorced    Spouse name: Not on file  . Number of children: 0  . Years of education: undergrad  . Highest education level: Not on file  Occupational History  . Occupation: SAP PDM ANALYST    Employer: [employer]    Employer: [employer]  Tobacco Use  . Smoking status: Never Smoker  . Smokeless tobacco: Never Used  Vaping Use  . Vaping Use: Never used  Substance and Sexual Activity  . Alcohol use: No    Alcohol/week: 0.0 standard drinks  . Drug use: No  . Sexual activity: Not Currently    Partners: Male    Birth control/protection: Surgical, Condom    Comment: TVH  Other Topics Concern  . Not on file  Social History Narrative   Consumes 120mg  caffeine daily,left handed   Social Determinants of Health   Financial Resource Strain: Not on file  Food Insecurity: Not on file  Transportation Needs: Not on file  Physical Activity: Not on file  Stress: Not on file  Social Connections: Not on file  Intimate Partner Violence: Not on file    Review of Systems  Constitutional: Negative.   HENT: Negative.   Eyes: Negative.   Respiratory: Negative.   Cardiovascular: Negative.   Gastrointestinal:  Negative.   Endocrine: Negative.   Genitourinary: Negative.   Musculoskeletal: Negative.   Skin: Negative.   Allergic/Immunologic: Negative.   Neurological: Negative.   Hematological: Negative.   Psychiatric/Behavioral: Negative.     PHYSICAL EXAMINATION:    BP 112/62 (BP Location: Right Arm, Patient Position: Sitting, Cuff Size: Normal)   Pulse 88  Resp 16   Ht 5\' 6"  (1.676 m)   Wt 145 lb 12.8 oz (66.1 kg)   LMP 06/28/2000   BMI 23.53 kg/m     General appearance: alert, cooperative and appears stated age   Skin: abdominal wall skin with raise red vascular looking nevi most consistent with cherry hemangiomas.  Pelvic: External genitalia:  Dark pigmentation in a stripe infiltrating pattern of the perineal body.             Vulvar biopsy Consent for procedure.  Sterile prep with betadine,  1% lidocaine, lot 08/26/2000, exp May 2024.  3 mm punch of the perineal body.  Tissue to pathology.  Silver nitrate used.  Minimal EBL.  No complications.   Chaperone was present for exam.  ASSESSMENT  Perineal hyperpigmentation.  ASCUS pap, negative HR HPV. Hx cervical dyplasia, status post hysterectomy.   PLAN  Fu biopsy result.  Post biopsy instructions to patient.  Pap results reviewed.  Next pap and HR HPV in one year.

## 2020-06-06 NOTE — Telephone Encounter (Signed)
Spoke with pt. Pt states went [facility] and had measurements for compression socks and was advised to have 20-39mm. Pt states will try out and if too tight will go down per [facility]. Pt calling to give update to Dr Edward Jolly and make sure compression socks she bought are ok to use.   Routing to Dr Edward Jolly for update

## 2020-06-06 NOTE — Telephone Encounter (Signed)
Patient was told to get compression stockings and she need to know the level.

## 2020-06-06 NOTE — Patient Instructions (Signed)
Vulva Biopsy, Care After This sheet gives you information about how to care for yourself after your procedure. Your doctor may also give you more specific instructions. If you have problems or questions, contact your doctor. What can I expect after the procedure? After the procedure, it is common to have:  Slight bleeding from the biopsy site.  Soreness at the biopsy site. Follow these instructions at home: Biopsy site care   Follow instructions from your doctor about how to take care of your biopsy site. Make sure you: ? Clean the area using water and mild soap two times a day or as told by your doctor. Gently pat the area dry. ? If you were prescribed an antibiotic ointment, apply it as told by your doctor. Do not stop using the antibiotic even if your condition gets better. ? Take a warm water bath (sitz bath) as needed to help with pain. A sitz bath is taken while you are sitting down. The water should only come up to your hips and cover your butt. ? Leave stitches (sutures), skin glue, or skin tape (adhesive) strips in place. They may need to stay in place for 2 weeks or longer. If tape strips get loose and curl up, you may trim the loose edges. Do not remove tape strips completely unless your doctor says it is okay. ? Check your biopsy area every day for signs of infection. Check for:  More redness, swelling, or pain.  More fluid or blood.  Warmth.  Pus or a bad smell. ? Do not rub the biopsy area after peeing (urinating).  Gently pat the area dry, or use a bottle filled with warm water (peri-bottle) to clean the area.  Gently wipe from front to back. Lifestyle  Wear loose, cotton underwear.  Do not wear tight pants.  Do not use a tampon, douche, or put anything in your vagina for at least 1 week or until your doctor says it is okay.  Do not have sex for at least 1 week or until your doctor says it is okay.  Do not exercise until your doctor says it is okay.  Do not  swim or use a hot tub until your doctor says it is okay. You may shower or take a sitz bath. General instructions  Take over-the-counter and prescription medicines only as told by your doctor.  Use a sanitary pad until the bleeding stops.  Keep all follow-up visits as told by your doctor. This is important. Contact a doctor if:  You have more redness, swelling, or pain around your biopsy site.  You have more fluid or blood coming from your biopsy site.  Your biopsy site feels warm when you touch it.  Medicines do not help with your pain. Get help right away if:  You have a lot of bleeding from the vulva.  You have pus or a bad smell coming from your biopsy site.  You have a fever.  You have pain in the lower belly (abdomen). Summary  After the procedure, it is common to have slight bleeding and soreness at the biopsy site.  Follow all instructions as told by your doctor. Clean the area with water and mild soap. Do not rub. Pat the area dry.  Take sitz baths as needed. Leave any stitches in place.  Check your biopsy site for infection. Signs include more redness, swelling, pain, fluid, or blood, or feeling warm when you touch it.  Get help right away if you have a   lot of bleeding, a fever, pus or a bad smell, or pain in your lower belly. This information is not intended to replace advice given to you by your health care provider. Make sure you discuss any questions you have with your health care provider. Document Revised: 12/15/2017 Document Reviewed: 12/15/2017 Elsevier Patient Education  2020 Elsevier Inc.  

## 2020-06-08 NOTE — Telephone Encounter (Signed)
These compression hose should be appropriate.

## 2020-06-11 ENCOUNTER — Other Ambulatory Visit: Payer: Self-pay | Admitting: Physician Assistant

## 2020-06-12 LAB — SURGICAL PATHOLOGY

## 2020-06-13 ENCOUNTER — Ambulatory Visit (INDEPENDENT_AMBULATORY_CARE_PROVIDER_SITE_OTHER): Payer: 59 | Admitting: Psychiatry

## 2020-06-13 ENCOUNTER — Other Ambulatory Visit: Payer: Self-pay

## 2020-06-13 DIAGNOSIS — F331 Major depressive disorder, recurrent, moderate: Secondary | ICD-10-CM | POA: Diagnosis not present
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
## 2020-06-18 ENCOUNTER — Telehealth: Payer: Self-pay

## 2020-06-18 ENCOUNTER — Other Ambulatory Visit: Payer: Self-pay | Admitting: Neurology

## 2020-06-18 DIAGNOSIS — G4711 Idiopathic hypersomnia with long sleep time: Secondary | ICD-10-CM

## 2020-06-18 DIAGNOSIS — G4733 Obstructive sleep apnea (adult) (pediatric): Secondary | ICD-10-CM

## 2020-06-18 MED ORDER — ESTROGENS CONJUGATED 0.625 MG PO TABS: 0.6250 mg | ORAL_TABLET | Freq: Every day | ORAL | 3 refills | Status: DC

## 2020-06-18 MED ORDER — AMPHETAMINE-DEXTROAMPHETAMINE 10 MG PO TABS: 10.0000 mg | ORAL_TABLET | Freq: Two times a day (BID) | ORAL | 0 refills | Status: DC

## 2020-06-18 NOTE — Telephone Encounter (Signed)
Ok to increase Premarin to .625 mg daily.  Rx for one year.

## 2020-06-18 NOTE — Telephone Encounter (Signed)
Per [facility], last filled on 05/14/2020 Dextroamp-Amphetamin 10 Mg Tab #60.00/30 prescribed by Dr Frances Furbish. Pending appt for 09/16/20. MD is out of office. Will send request to work-in, Dr Anne Hahn.

## 2020-06-18 NOTE — Telephone Encounter (Signed)
Left detailed message per DPR. Pt advised of new Rx sent to pharmacy. Pt to return call to office with any questions or concerns.  Encounter closed

## 2020-06-18 NOTE — Telephone Encounter (Signed)
Patient would like to switch back to original dosage on the premarin. Hotflashes are worsen on lower dosage.

## 2020-06-18 NOTE — Telephone Encounter (Signed)
The Adderall was refilled. 

## 2020-06-18 NOTE — Telephone Encounter (Signed)
Pt is needing a refill on her amphetamine-dextroamphetamine (ADDERALL) 10 MG tablet sent in to the [facility] on [address] 

## 2020-06-18 NOTE — Telephone Encounter (Signed)
[facility] 05/29/2020 H/o DM   Spoke with pt. Pt states having increased hot flashes and night sweats for 1 week now since lowering dose of Premarin 05/29/2020 at [facility]. Pt asking if ok to go back to Premarin 0.625 mg? Pt states has been thinking blood sugar was low with having being hot or sweating at night. Pt states has checked blood sugar and all normal levels.   Pt advised will review with Dr Edward Jolly. Pharmacy verified.   Routing to Dr Edward Jolly, please advise

## 2020-07-01 ENCOUNTER — Other Ambulatory Visit: Payer: Self-pay

## 2020-07-01 ENCOUNTER — Encounter (HOSPITAL_COMMUNITY): Payer: Self-pay | Admitting: Psychiatry

## 2020-07-01 ENCOUNTER — Telehealth (INDEPENDENT_AMBULATORY_CARE_PROVIDER_SITE_OTHER): Payer: 59 | Admitting: Psychiatry

## 2020-07-01 VITALS — Wt 144.0 lb

## 2020-07-01 DIAGNOSIS — F331 Major depressive disorder, recurrent, moderate: Secondary | ICD-10-CM

## 2020-07-01 DIAGNOSIS — F419 Anxiety disorder, unspecified: Secondary | ICD-10-CM

## 2020-07-01 MED ORDER — BUPROPION HCL ER (XL) 300 MG PO TB24
300.0000 mg | ORAL_TABLET | ORAL | 2 refills | Status: DC
Start: 2020-07-01 — End: 2020-10-01

## 2020-07-01 MED ORDER — DULOXETINE HCL 60 MG PO CPEP: ORAL_CAPSULE | ORAL | 2 refills | Status: DC

## 2020-07-01 NOTE — Progress Notes (Signed)
Virtual Visit via Telephone Note  I connected with [name] [name] on 07/01/20 at  2:00 PM EST by telephone and verified that I am speaking with the correct person using two identifiers.  Location: Patient: Home Provider: Home Office   I discussed the limitations, risks, security and privacy concerns of performing an evaluation and management service by telephone and the availability of in person appointments. I also discussed with the patient that there may be a patient responsible charge related to this service. The patient expressed understanding and agreed to proceed.   History of Present Illness: Patient is evaluated by phone social.  She had a quite Christmas.  She recently find out that 3 of uncle diagnosed with dementia in their 70s.  She is somewhat concerned Dr. Margart Sickles given has some time it could be diagnosed with age-related cognitive impairment.  Patient used to have a lot of attention and focus issues but now she feels things are going well.  She is not as depressed and anxious.  She is using mostly CPAP every day since she ordered a bigger mask.  She does take Xanax 0.25 mg as needed prescribed by her endocrinologist Dr. Evlyn Kanner.  She is no longer taking Lunesta.  She feels the Wellbutrin and Cymbalta together working very well.  She is also prescribed by Adderall from neurologist which helped her attention and focus.  She supposed to work in person starting today but now it is extended further as new cases of COVID.  She is continue to work from home.  She is working for a [employer].  She feels therapy with Florencia Reasons is working very well.  She does not want to change the medication.  Her appetite is okay.  She has no EPS or any shakes.  She denies any recent panic attack.  Past psychiatric history; No h/oinpatient treatment or suicidal attempt. Noh/opsychosis,mania, orabuse. TookWellbutrin prescribed by PCP. Dose increased to 450 few years ago.We tried  Lamictal but caused rash. Neurologist had prescribed Adderall to help her memory.  Psychiatric Specialty Exam: Physical Exam  Review of Systems  Weight 144 lb (65.3 kg), last menstrual period 06/28/2000.There is no height or weight on file to calculate BMI.  General Appearance: NA  Eye Contact:  NA  Speech:  Clear and Coherent  Volume:  Normal  Mood:  Anxious  Affect:  NA  Thought Process:  Goal Directed  Orientation:  Full (Time, Place, and Person)  Thought Content:  WDL  Suicidal Thoughts:  No  Homicidal Thoughts:  No  Memory:  Immediate;   Good Recent;   Good Remote;   Good  Judgement:  Intact  Insight:  Present  Psychomotor Activity:  NA  Concentration:  Concentration: Good and Attention Span: Good  Recall:  Good  Fund of Knowledge:  Good  Language:  Good  Akathisia:  No  Handed:  Right  AIMS (if indicated):     Assets:  Communication Skills Desire for Improvement Housing Resilience Social Support  ADL's:  Intact  Cognition:  WNL  Sleep:   6 hrs      Assessment and Plan: Major depressive disorder, recurrent.  Anxiety.  Discussed etiology of cognitive impairment.  Reassurance given.  Recommended not to take Xanax unless needed due to their side effects.  Patient has not taken the last however I recommend to take melatonin if needed for insomnia.  Recommended to use CPAP every day which is helping her sleep.  Recommended therapy with Florencia Reasons.  Discussed medication  side effects and benefits.  Continue Wellbutrin XL 300 mg daily and Cymbalta 60 mg daily.  Recommended to call us back if is any question or any concern.  Follow-up in 3 months.  Follow Up Instructions:    I discussed the assessment and treatment plan with the patient. The patient was provided an opportunity to ask questions and all were answered. The patient agreed with the plan and demonstrated an understanding of the instructions.   The patient was advised to call back or seek an in-person  evaluation if the symptoms worsen or if the condition fails to improve as anticipated.  I provided 15 minutes of non-face-to-face time during this encounter.   Cleotis Nipper, MD

## 2020-07-04 ENCOUNTER — Other Ambulatory Visit: Payer: Self-pay

## 2020-07-04 ENCOUNTER — Ambulatory Visit (INDEPENDENT_AMBULATORY_CARE_PROVIDER_SITE_OTHER): Payer: 59 | Admitting: Psychiatry

## 2020-07-04 DIAGNOSIS — F331 Major depressive disorder, recurrent, moderate: Secondary | ICD-10-CM | POA: Diagnosis not present

## 2020-07-04 NOTE — Progress Notes (Signed)
Virtual Visit via Video Note  I connected with [name] [name] on 07/04/20 at 9:03 AM EST by a video enabled telemedicine application and verified that I am speaking with the correct person using two identifiers.  Location: Patient: Home Provider: [facility] [place] office    I discussed the limitations of evaluation and management by telemedicine and the availability of in person appointments. The patient expressed understanding and agreed to proceed.   I provided 44 minutes of non-face-to-face time during this encounter.   [name], LCSW THERAPIST PROGRESS NOTE     Session Time:  Friday 07/04/2020 9:03 AM  - 9:47 AM   Participation Level: Active  Behavioral Response: CasualAlert/ less depressed   Type of Therapy: Individual Therapy  Treatment Goals addressed:  Patient would like to decrease anxiety related to work and medical stressors to function at her best and advocate for self more assertiveness  Interventions: CBT and Supportive  Summary: [name] [name] is a 55 y.o. female who is referred for sevices for continuity of care by therapist Hilbert Odor while therapist is on temporary leave. She denies any psychiatric hospitalizations. She has been in outpatient therapy for a year and currently sees psychiatrist [name] for medication management.  She reports needing help with stress management mainly with work issues.  Patient reports being under a lot of stress due to work demands and says it has implications for her diabetes management.  She reports tendency to internalize thoughts and feelings. She reports frequent worry about not getting information timely from coworkers to perform her job.  Current symptoms include difficulty concentrating, fatigue, thoughts/feelings of hopelessness, irritability, sleep difficulty, tearfulness, fatigue, and excessive worry.      Patient last was seen via virtual visit about 2-3 weeks ago.  She reports continued improved mood  and increased behavioral activation since last session.  She continues to walk and have contact with friends and family.  She has increased her involvement in her hobbies of reading and knitting.  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  She is very pleased with the way she presented and respond during interview.  She reports being more mindful and using information to project self more positively.  She has increased her awareness of core beliefs and has been able to challenge and replace.  She cites example regarding her response to a critical comment during one of her interviews.    Suicidal/Homicidal: Nowithout intent/plan  Therapist Response: reviewed symptoms, praised and reinforced patient's increased behavioral activation/socialization, praised and reinforced patient's job search efforts/interviews, reviewed connection between thoughts/mood/behavior using examples from patient's life, encouraged patient to maintain consistency regarding efforts for improved self-care and behavioral activation  Plan: Return again in 2  weeks.  Diagnosis: Axis I: MDD    Anxiety       [name], LCSW 07/04/2020

## 2020-07-17 ENCOUNTER — Other Ambulatory Visit: Payer: Self-pay

## 2020-07-17 ENCOUNTER — Ambulatory Visit (INDEPENDENT_AMBULATORY_CARE_PROVIDER_SITE_OTHER): Payer: 59 | Admitting: Psychiatry

## 2020-07-17 DIAGNOSIS — F331 Major depressive disorder, recurrent, moderate: Secondary | ICD-10-CM | POA: Diagnosis not present

## 2020-07-17 NOTE — Progress Notes (Signed)
Virtual Visit via Video Note  I connected with [name] [name] on 07/17/20 at 10:00 AM EST by a video enabled telemedicine application and verified that I am speaking with the correct person using two identifiers.  Location: Patient: Home Provider: [facility] Out[facility] [place] office   I discussed the limitations of evaluation and management by telemedicine and the availability of in person appointments. The patient expressed understanding and agreed to proceed.  I provided 40  minutes of non-face-to-face time during this encounter.   [name], LCSW  Virtual Visit via Video Note  THERAPIST PROGRESS NOTE     Session Time:  Thursday 07/17/2020 10:15 AM - 10:55 AM   Participation Level: Active  Behavioral Response: CasualAlert/ less depressed   Type of Therapy: Individual Therapy  Treatment Goals addressed:  Patient would like to decrease anxiety related to work and medical stressors to function at her best and advocate for self more assertiveness  Interventions: CBT and Supportive  Summary: [name] [name] is a 55 y.o. female who is referred for sevices for continuity of care by therapist Hilbert Odor while therapist is on temporary leave. She denies any psychiatric hospitalizations. She has been in outpatient therapy for a year and currently sees psychiatrist [name] for medication management.  She reports needing help with stress management mainly with work issues.  Patient reports being under a lot of stress due to work demands and says it has implications for her diabetes management.  She reports tendency to internalize thoughts and feelings. She reports frequent worry about not getting information timely from coworkers to perform her job.  Current symptoms include difficulty concentrating, fatigue, thoughts/feelings of hopelessness, irritability, sleep difficulty, tearfulness, fatigue, and excessive worry.      Patient last was seen via virtual visit about 2-3 weeks ago.   She reports continued improved mood and increased behavioral activation since last session.  She continues positive self-care. She is initiating more contact with family, friends, and neighbors.  She is looking forward to warmer weather and is making plans for the future.  She reports increased enjoyment of her hobbies of reading and knitting.  She still reports disappointment but increased acceptance regarding her current job.  She reports being less stressed by her job as she recognizes she still has options.  She continues to look for another job and reports considering a possible career change.  She is pleased with her progress in treatment.  Patient continues to sometimes have difficulty challenging and replacing core beliefs.   Suicidal/Homicidal: Nowithout intent/plan  Therapist Response: reviewed symptoms, praised and reinforced patient's increased behavioral activation/socialization, praised and reinforced patient's job search efforts, discussed patient's progress in treatment, discussed neck steps for treatment to include more work regarding core beliefs, developed plan with patient to use thought log and to complete core beliefs worksheet in preparation for next session Plan: Return again in 2  weeks.  Diagnosis: Axis I: MDD    Anxiety       [name], LCSW 07/17/2020

## 2020-07-18 ENCOUNTER — Ambulatory Visit (HOSPITAL_COMMUNITY): Payer: 59 | Admitting: Psychiatry

## 2020-07-25 ENCOUNTER — Other Ambulatory Visit: Payer: Self-pay | Admitting: Neurology

## 2020-07-25 DIAGNOSIS — G4711 Idiopathic hypersomnia with long sleep time: Secondary | ICD-10-CM

## 2020-07-25 DIAGNOSIS — G4733 Obstructive sleep apnea (adult) (pediatric): Secondary | ICD-10-CM

## 2020-07-25 NOTE — Telephone Encounter (Signed)
Pt request refill amphetamine-dextroamphetamine (ADDERALL) 10 MG tablet at [facility]

## 2020-07-28 MED ORDER — AMPHETAMINE-DEXTROAMPHETAMINE 10 MG PO TABS: 10.0000 mg | ORAL_TABLET | Freq: Two times a day (BID) | ORAL | 0 refills | Status: DC

## 2020-07-28 NOTE — Telephone Encounter (Signed)
[place] drug registry has been verified. Last refill was 06/18/2020, # 60 for a 30 day supply.

## 2020-07-31 ENCOUNTER — Ambulatory Visit (HOSPITAL_COMMUNITY): Payer: 59 | Admitting: Psychiatry

## 2020-08-14 ENCOUNTER — Other Ambulatory Visit: Payer: Self-pay

## 2020-08-14 ENCOUNTER — Ambulatory Visit (INDEPENDENT_AMBULATORY_CARE_PROVIDER_SITE_OTHER): Payer: 59 | Admitting: Psychiatry

## 2020-08-14 DIAGNOSIS — F331 Major depressive disorder, recurrent, moderate: Secondary | ICD-10-CM | POA: Diagnosis not present

## 2020-08-14 NOTE — Progress Notes (Signed)
Virtual Visit via Video Note  I connected with [name] [name] on 08/14/20 at 2:08 PM EST  by a video enabled telemedicine application and verified that I am speaking with the correct person using two identifiers.  Location: Patient: Home Provider: [facility] [place] office    I discussed the limitations of evaluation and management by telemedicine and the availability of in person appointments. The patient expressed understanding and agreed to proceed.  I provided 47  minutes of non-face-to-face time during this encounter.   [name], LCSW   THERAPIST PROGRESS NOTE     Session Time:  Thursday 08/14/2020 2:08 PM - 2:55 PM  Participation Level: Active  Behavioral Response: CasualAlert/ less depressed   Type of Therapy: Individual Therapy  Treatment Goals addressed:  Patient would like to decrease anxiety related to work and medical stressors to function at her best and advocate for self more assertiveness  Interventions: CBT and Supportive  Summary: [name] [name] is a 55 y.o. female who is referred for sevices for continuity of care by therapist Hilbert Odor while therapist is on temporary leave. She denies any psychiatric hospitalizations. She has been in outpatient therapy for a year and currently sees psychiatrist [name] for medication management.  She reports needing help with stress management mainly with work issues.  Patient reports being under a lot of stress due to work demands and says it has implications for her diabetes management.  She reports tendency to internalize thoughts and feelings. She reports frequent worry about not getting information timely from coworkers to perform her job.  Current symptoms include difficulty concentrating, fatigue, thoughts/feelings of hopelessness, irritability, sleep difficulty, tearfulness, fatigue, and excessive worry.      Patient last was seen via virtual visit about 2-3 weeks ago.  She reports continued improved  mood and increased behavioral activation since last session.  She continues positive self-care. She continues to  Initiate contact with family, friends, and neighbors.  She is looking forward to warmer weather and is making plans for the future.  She reports increased enjoyment of her hobbies of reading and knitting.  She also has begun making jewelry.  She reports feeling better now that she is more involved in pleasurable activities outside of work.  Continues to express some frustration regarding her job but has been more assertive and expressing her concerns she continues to look for another job and has gone on several interviews.  She is pleased she has not gone in a downward spiral when she is not offered a job.  She reports trying to look at the interview process as an opportunity regardless of the outcome.  Patient completed homework using thought log and challenging one of her core beliefs.    Suicidal/Homicidal: Nowithout intent/plan  Therapist Response: reviewed symptoms, praised and reinforced patient's increased behavioral activation/socialization, praised and reinforced patient's improved use of assertiveness skills, praised and reinforced patient's job search efforts, discussed patient's progress in treatment, processed thought log, reviewed connection between core beliefs/beliefs and assumptions/automatic thoughts, assisted patient identify/challenge/and replace unhelpful thoughts with more helpful thoughts, developed plan with patient to complete thought log daily, also developed plan with patient to read evidence contrary to one of her negative core beliefs ( I am not good enough) daily, schedule stepdown plan for termination to include 3-4 more sessions  Plan: Return again in 2  weeks.  Diagnosis: Axis I: MDD    Anxiety       [name], LCSW 08/14/2020

## 2020-08-28 ENCOUNTER — Ambulatory Visit (INDEPENDENT_AMBULATORY_CARE_PROVIDER_SITE_OTHER): Payer: 59 | Admitting: Psychiatry

## 2020-08-28 ENCOUNTER — Other Ambulatory Visit: Payer: Self-pay

## 2020-08-28 DIAGNOSIS — F331 Major depressive disorder, recurrent, moderate: Secondary | ICD-10-CM

## 2020-08-28 NOTE — Progress Notes (Signed)
Virtual Visit via Video Note  I connected with [name] [name] on 08/28/20 at 4:10 PM EST  by a video enabled telemedicine application and verified that I am speaking with the correct person using two identifiers.  Location: Patient: Home Provider: [facility] Out[facility] [place] office    I discussed the limitations of evaluation and management by telemedicine and the availability of in person appointments. The patient expressed understanding and agreed to proceed.  I provided 50 minutes of non-face-to-face time during this encounter.   [name], LCSW  THERAPIST PROGRESS NOTE     Session Time:  Thursday 3/32022  4:10 PM - 5:00 PM   Participation Level: Active  Behavioral Response: CasualAlert/ less depressed   Type of Therapy: Individual Therapy  Treatment Goals addressed:  Patient would like to decrease anxiety related to work and medical stressors to function at her best and advocate for self more assertiveness  Interventions: CBT and Supportive  Summary: [name] [name] is a 55 y.o. female who is referred for sevices for continuity of care by therapist Hilbert Odor while therapist is on temporary leave. She denies any psychiatric hospitalizations. She has been in outpatient therapy for a year and currently sees psychiatrist [name] for medication management.  She reports needing help with stress management mainly with work issues.  Patient reports being under a lot of stress due to work demands and says it has implications for her diabetes management.  She reports tendency to internalize thoughts and feelings. She reports frequent worry about not getting information timely from coworkers to perform her job.  Current symptoms include difficulty concentrating, fatigue, thoughts/feelings of hopelessness, irritability, sleep difficulty, tearfulness, fatigue, and excessive worry.      Patient last was seen via virtual visit about 2-3 weeks ago.  She reports continued improved  mood and increased behavioral activation since last session.  She continues positive self-care. She has been socializing with family and friends and has gone out to dinner.  She continues to exercise. She also continues to enjoy her hobbies including reading and knitting.  She has been using thought log and has been able to identify/challenge/replace unhelpful negative automatic thoughts with more helpful thoughts.  She cites recent incidents on her job and is pleased with the way she responded to the situation.  She was assertive and expressed her opinions.  She reports reading evidence contrary to her negative core belief regularly and reports this has been very helpful regarding the way she views and responds to situations.     Suicidal/Homicidal: Nowithout intent/plan  Therapist Response: reviewed symptoms, praised and reinforced patient's continued behavioral activation/socialization, praised and reinforced patient's improved use of assertiveness skills, praised and reinforced patient's use of thought log, processed thought log, assisted patient identify triggers that activate her core belief, encourage patient to continue to read evidence contrary to her core beliefs when activated, developed plan with patient to continue using thought log, discussed lapse versus relapse of depression, discussed next steps for treatment to focus on learning and implementing relapse prevention strategies   Plan: Return again in 2  weeks.  Diagnosis: Axis I: MDD    Anxiety       [name], LCSW 08/28/2020

## 2020-09-06 ENCOUNTER — Emergency Department (HOSPITAL_COMMUNITY)
Admission: EM | Admit: 2020-09-06 | Discharge: 2020-09-07 | Disposition: A | Discharge status: Home / Self Care | Payer: Managed Care, Other (non HMO) | Attending: Emergency Medicine | Admitting: Emergency Medicine

## 2020-09-06 ENCOUNTER — Encounter (HOSPITAL_COMMUNITY): Payer: Self-pay | Admitting: Emergency Medicine

## 2020-09-06 ENCOUNTER — Other Ambulatory Visit: Payer: Self-pay

## 2020-09-06 DIAGNOSIS — E1010 Type 1 diabetes mellitus with ketoacidosis without coma: Secondary | ICD-10-CM | POA: Diagnosis not present

## 2020-09-06 DIAGNOSIS — S0003XA Contusion of scalp, initial encounter: Secondary | ICD-10-CM | POA: Diagnosis not present

## 2020-09-06 DIAGNOSIS — W19XXXA Unspecified fall, initial encounter: Secondary | ICD-10-CM

## 2020-09-06 DIAGNOSIS — W01198A Fall on same level from slipping, tripping and stumbling with subsequent striking against other object, initial encounter: Secondary | ICD-10-CM | POA: Diagnosis not present

## 2020-09-06 DIAGNOSIS — S0990XA Unspecified injury of head, initial encounter: Secondary | ICD-10-CM | POA: Diagnosis present

## 2020-09-06 DIAGNOSIS — Z79899 Other long term (current) drug therapy: Secondary | ICD-10-CM | POA: Insufficient documentation

## 2020-09-06 DIAGNOSIS — E039 Hypothyroidism, unspecified: Secondary | ICD-10-CM | POA: Insufficient documentation

## 2020-09-06 DIAGNOSIS — Z794 Long term (current) use of insulin: Secondary | ICD-10-CM | POA: Diagnosis not present

## 2020-09-06 DIAGNOSIS — M542 Cervicalgia: Secondary | ICD-10-CM | POA: Diagnosis not present

## 2020-09-06 LAB — CBC
HCT: 42.2 % (ref 36.0–46.0)
Hemoglobin: 13.4 g/dL (ref 12.0–15.0)
MCH: 32.4 pg (ref 26.0–34.0)
MCHC: 31.8 g/dL (ref 30.0–36.0)
MCV: 102.2 fL — ABNORMAL HIGH (ref 80.0–100.0)
Platelets: 258 10*3/uL (ref 150–400)
RBC: 4.13 MIL/uL (ref 3.87–5.11)
RDW: 12.3 % (ref 11.5–15.5)
WBC: 6 10*3/uL (ref 4.0–10.5)
nRBC: 0 % (ref 0.0–0.2)

## 2020-09-06 NOTE — ED Provider Notes (Signed)
[facility]-EMERGENCY DEPT Provider Note   CSN: [id number] Arrival date & time: 09/06/20  2224     History Chief Complaint  Patient presents with   Fall    [name] [name] is a 55 y.o. female.  Patient presents to the emergency department with a chief complaint of head injury.  She states that she slipped on wet tile earlier this morning.  She states that her feet went out from underneath her and she landed on the back of her head.  She did not lose consciousness, but reports that she did "see stars."  She denies having any vomiting, seizure activity, numbness, weakness, or tingling.  She states that she has had increased headache and neck pain throughout the day.  Additionally, she states that she has been having persistent diarrhea today and that her blood sugars have been running low.  The history is provided by the patient. No language interpreter was used.       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   Diabetes mellitus    Dry eyes, bilateral 2018   Fall at home 05/2018   injured low back/spine/sciatic nerve--had 2 spinal injections   Ketoacidosis 9892,1194   Melasma 01/2020   Meningitis 06/2001   --hospital   Rheumatoid arthritis (HCC) 2014   Sleep apnea 2013   Sleep apnea 2011   STD (sexually transmitted disease) 6/09   HSV II   Stress    Thyroid disease    hypothyroidism   VAIN I (vaginal intraepithelial neoplasia grade I) 2013    Patient Active Problem List   Diagnosis Date Noted   Confusion 11/28/2017   Dizziness 11/28/2017   Insulin pump in place 09/22/2017   Cellulitis 09/21/2017   Diabetic ketoacidosis without coma associated with diabetes mellitus due to underlying condition (HCC) 09/21/2017   AKI (acute kidney injury) (HCC) 09/20/2017   Hyperglycemia 09/20/2017   Hypotension 09/20/2017   Sepsis (HCC) 09/20/2017    Intermittent confusion 04/18/2017   OSA (obstructive sleep apnea) 04/18/2017   HYPOTHYROIDISM 09/21/2007   DIABETES MELLITUS, TYPE I 09/21/2007   ANXIETY DEPRESSION 09/21/2007   NAUSEA 09/21/2007    Past Surgical History:  Procedure Laterality Date   ABDOMINAL HYSTERECTOMY     2004   CERVICAL BIOPSY  W/ LOOP ELECTRODE EXCISION  01-24-95   CIN I   COLPOSCOPY  11-21-02   CIN III   COLPOSCOPY  05-05-12   no lesions--needs repeat pap 08/2012 per Dr. Tresa Res   HAND SURGERY  2004   tore tendon   positive HPV  03/21/15   normal pap and negative types 16/18   SOFT TISSUE CYST EXCISION  12/2010   left breast epidermoid inclusion cyst   SPINE SURGERY  05/21/14   TONSILLECTOMY AND ADENOIDECTOMY       OB History    Gravida  0   Para  0   Term  0   Preterm  0   AB  0   Living  0     SAB  0   IAB  0   Ectopic  0   Multiple  0   Live Births              Family History  Problem Relation Age of Onset   Other Mother        cysts   Diabetes Father    Kidney disease Father  Stroke Paternal Aunt    Breast cancer Neg Hx     Social History   Tobacco Use   Smoking status: Never Smoker   Smokeless tobacco: Never Used  Vaping Use   Vaping Use: Never used  Substance Use Topics   Alcohol use: No    Alcohol/week: 0.0 standard drinks   Drug use: No    Home Medications Prior to Admission medications   Medication Sig Start Date End Date Taking? Authorizing Provider  ALPRAZolam Prudy Feeler) 0.5 MG tablet Take 0.25 mg by mouth as needed for anxiety.    [provider]  amphetamine-dextroamphetamine (ADDERALL) 10 MG tablet Take 1 tablet (10 mg total) by mouth 2 (two) times daily. 07/28/20   Huston Foley, MD  Ascorbic Acid (VITAMIN C) 1000 MG tablet Take 1,000 mg by mouth daily.    [provider]  Biotin 10 MG CAPS Take 1 capsule by mouth daily.    [provider]  buPROPion (WELLBUTRIN XL) 300 MG 24 hr tablet Take 1 tablet  (300 mg total) by mouth every morning. 07/01/20 07/01/21  Arfeen, Phillips Grout, MD  Continuous Blood Gluc Transmit (DEXCOM G6 TRANSMITTER) MISC CHANGE EVERY 3 MONTHS TO MONITOR BLOOD GLUCOSE CONTINUOUSLY 02/21/20   [provider]  DULoxetine (CYMBALTA) 60 MG capsule Take one capsule (60mg  total) by mouth daily. 07/01/20   Arfeen, 08/29/20, MD  estrogens, conjugated, (PREMARIN) 0.625 MG tablet Take 1 tablet (0.625 mg total) by mouth daily. 06/18/20   06/20/20, Ardell Isaacs, MD  eszopiclone (LUNESTA) 2 MG TABS tablet TAKE ONE TABLET BY MOUTH IMMEDIATELY BEFORE BEDTIME AS NEEDED FOR SLEEP Patient not taking: Reported on 07/01/2020 10/02/19   12/02/19, MD  hydroquinone 4 % cream SMARTSIG:1 Topical Every Night 03/06/20   05/06/20, MD  Insulin Human (INSULIN PUMP) SOLN Inject 25 each into the skin as directed. Throughout the day (Per BS)    [provider]  insulin lispro (HUMALOG) 100 UNIT/ML injection Inject 25 Units into the skin as needed for high blood sugar.     [provider]  levothyroxine (SYNTHROID, LEVOTHROID) 137 MCG tablet Take 137 mcg by mouth daily. 10/03/17   [provider]  Vitamin D, Ergocalciferol, (DRISDOL) 1.25 MG (50000 UNIT) CAPS capsule Take 50,000 Units by mouth once a week. 12/18/19   [provider]  Vitamin E 400 units TABS Take 1 capsule by mouth daily.    [provider]  XIIDRA 5 % SOLN Apply to eye 2 (two) times daily. 03/17/16   [provider]    Allergies    Bee venom, Penicillins, Sulfa antibiotics, and Lamictal [lamotrigine]  Review of Systems   Review of Systems  All other systems reviewed and are negative.   Physical Exam Updated Vital Signs BP (!) 149/67 (BP Location: Left Arm)    Pulse 95    Temp 98.2 F (36.8 C) (Oral)    Resp 16    Ht 5\' 4"  (1.626 m)    Wt 66.7 kg    LMP 06/28/2000    SpO2 100%    BMI 25.23 kg/m   Physical Exam Vitals and nursing note reviewed.  Constitutional:      General: She  is not in acute distress.    Appearance: She is well-developed.  HENT:     Head: Normocephalic.     Comments: Posterior scalp contusion/hematoma No laceration Eyes:     Conjunctiva/sclera: Conjunctivae normal.  Neck:     Comments: Mild central  cervical spine tenderness Cardiovascular:     Rate and Rhythm: Normal rate and regular rhythm.     Heart sounds: No murmur heard.   Pulmonary:     Effort: Pulmonary effort is normal. No respiratory distress.     Breath sounds: Normal breath sounds.  Abdominal:     Palpations: Abdomen is soft.     Tenderness: There is no abdominal tenderness.  Musculoskeletal:        General: Normal range of motion.     Cervical back: Neck supple.  Skin:    General: Skin is warm and dry.  Neurological:     Mental Status: She is alert and oriented to person, place, and time.  Psychiatric:        Mood and Affect: Mood normal.        Behavior: Behavior normal.     ED Results / Procedures / Treatments   Labs (all labs ordered are listed, but only abnormal results are displayed) Labs Reviewed  CBC  BASIC METABOLIC PANEL    EKG None  Radiology No results found.  Procedures Procedures   Medications Ordered in ED Medications - No data to display  ED Course  I have reviewed the triage vital signs and the nursing notes.  Pertinent labs & imaging results that were available during my care of the patient were reviewed by me and considered in my medical decision making (see chart for details).    MDM Rules/Calculators/A&P                          Patient presents to the emergency department with a chief complaint of fall and head injury.  She states that she slipped and fell on wet tile earlier today.  She did not pass out.  She states that she has had gradually worsening headache and some mild neck pain.  She states that she wanted to come in to be evaluated to make certain that everything was okay.  CT head and cervical spine showed no acute  traumatic findings.  I reviewed these results with the patient.  She was noted to have low glucose in triage, but patient states that this is normal for her.  She has a Dexcom glucometer, and monitors her blood sugar closely.  It is trending up.  BMP is unremarkable here.  Feel that patient is stable for discharge.     Final Clinical Impression(s) / ED Diagnoses Final diagnoses:  Fall, initial encounter  Injury of head, initial encounter    Rx / DC Orders ED Discharge Orders    None       Roxy Horseman, PA-C 09/07/20 0111    Dione Booze, MD 09/07/20 2240

## 2020-09-06 NOTE — ED Notes (Signed)
Unable to obtain CBG due to pt having raynauds and not being able to get enough blood from finger stick. Pt has dexcom CBG as well as a separate glucometer w/ lancet. Pt states dexcom reading 56. Pt stuck herself with her own lancet and using personal glucometer obtained a reading of 44. Pt states she does not feel when her sugar is low until it is critically low. Pt drinking apple juice at this time.  Triage RN aware. Will continue to monitor.

## 2020-09-06 NOTE — ED Triage Notes (Signed)
Patient complaining of falling and hitting her head on her fall around 1930. Patient states head is throbbing.

## 2020-09-07 ENCOUNTER — Emergency Department (HOSPITAL_COMMUNITY): Payer: Managed Care, Other (non HMO)

## 2020-09-07 LAB — BASIC METABOLIC PANEL
Anion gap: 10 (ref 5–15)
BUN: 17 mg/dL (ref 6–20)
CO2: 27 mmol/L (ref 22–32)
Calcium: 9.5 mg/dL (ref 8.9–10.3)
Chloride: 103 mmol/L (ref 98–111)
Creatinine, Ser: 0.82 mg/dL (ref 0.44–1.00)
GFR, Estimated: >60 mL/min (ref >60)
Glucose, Bld: 90 mg/dL (ref 70–99)
Potassium: 4 mmol/L (ref 3.5–5.1)
Sodium: 140 mmol/L (ref 135–145)

## 2020-09-11 ENCOUNTER — Ambulatory Visit (HOSPITAL_COMMUNITY): Payer: 59 | Admitting: Psychiatry

## 2020-09-12 ENCOUNTER — Other Ambulatory Visit: Payer: Self-pay | Admitting: Neurology

## 2020-09-12 DIAGNOSIS — Z9989 Dependence on other enabling machines and devices: Secondary | ICD-10-CM

## 2020-09-12 DIAGNOSIS — G4711 Idiopathic hypersomnia with long sleep time: Secondary | ICD-10-CM

## 2020-09-12 DIAGNOSIS — G4733 Obstructive sleep apnea (adult) (pediatric): Secondary | ICD-10-CM

## 2020-09-12 NOTE — Telephone Encounter (Signed)
Pt. is requesting a refill for amphetamine-dextroamphetamine (ADDERALL) 10 MG tablet.  Pharmacy: [facility] [phone]

## 2020-09-15 MED ORDER — AMPHETAMINE-DEXTROAMPHETAMINE 10 MG PO TABS: 10.0000 mg | ORAL_TABLET | Freq: Two times a day (BID) | ORAL | 0 refills | Status: DC

## 2020-09-15 NOTE — Telephone Encounter (Signed)
[place] drug registry has been verified. Last refill was 07/28/20 # 60 for a 30 day supply.  Pt has been complaint with f/u appts.

## 2020-09-15 NOTE — Addendum Note (Signed)
Addended by: Ann Maki on: 09/15/2020 10:48 AM   Modules accepted: Orders

## 2020-09-16 ENCOUNTER — Encounter: Payer: Self-pay | Admitting: Neurology

## 2020-09-16 ENCOUNTER — Ambulatory Visit: Payer: Managed Care, Other (non HMO) | Admitting: Neurology

## 2020-09-16 VITALS — BP 132/64 | HR 94 | Ht 64.0 in | Wt 145.0 lb

## 2020-09-16 DIAGNOSIS — G4711 Idiopathic hypersomnia with long sleep time: Secondary | ICD-10-CM | POA: Diagnosis not present

## 2020-09-16 DIAGNOSIS — G4733 Obstructive sleep apnea (adult) (pediatric): Secondary | ICD-10-CM

## 2020-09-16 DIAGNOSIS — Z9989 Dependence on other enabling machines and devices: Secondary | ICD-10-CM

## 2020-09-16 NOTE — Progress Notes (Signed)
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 Review of Systems  Neurological:       Here for year f/u. Reports overall she is doing well. Still having issues with soreness with the nose pillows.     Objective:  Neurological Exam  Physical Exam Physical Examination:   Vitals:   09/16/20 1436  BP: 132/64  Pulse: 94  SpO2: 100%    General Examination: The patient is a very pleasant 55 y.o. female in no acute distress. She appears well-developed and well-nourished and well groomed.   HEENT:Normocephalic, atraumatic, pupils are equal, round and reactive to light, extraocular tracking is good without limitation to gaze excursion or nystagmus noted.Corrective eyeglasses in place.Normal smooth pursuit is noted. Hearing is grossly intact. Face is symmetric with normal facial animation and normal facial sensation. Speech is clear with no dysarthria noted.Neck with FROM.Oropharynx exam reveals: No significant mouth dryness, good dental hygiene and mild airway crowding. Tongue protrudes centrally and palate elevates symmetrically. Tonsils are absent.   Chest:Clear to auscultation without wheezing, rhonchi or crackles noted.  Heart:S1+S2+0,nomurmur.  Abdomen:Soft, non-tender and non-distended.  Extremities:There is nochange.   Skin: Warm and dry without trophic changes noted.   Musculoskeletal: exam reveals no  abn.  Neurologically:  Mental status: The patient is awake, alert and oriented in all 4 spheres. Her memory, attention, language and knowledge are appropriate. There is no aphasia, agnosia, apraxia or anomia. Speech is clear with normal prosody and enunciation. Thought process is linear. Mood is normal and affect is anxious.  Cranial nerves are as described above under HEENT exam.  Motor exam: Normal bulk, strength and tone is noted. There is no tremor. Sensory exam is intact to light touchin the upper and lower extremities. Fine motor skills are grossly intact. On cerebellar testing: no dysmetria, or ataxia.  Gait, station and balance are unremarkable. No veering to one side is noted. No leaning to one side is noted. Posture is age-appropriate and stance is narrow based. No problems turning are noted.   Assessment and Plan:   In summary, Ms. Dowse is a very pleasant48 year old femalewith an underlying history of hypothyroidism, DM type I, anxiety and depression,low back pain,and s/p neck surgery,who presents forfollow up of her sleep disorder, Including obstructive sleep apnea and hypersomnolence. She has been compliant with her CPAP, generally speaking but has had some lapses in treatment because of nasal soreness and inability to tolerate the nasal pillows.  She is advised to try the nasal cushion interface.  I prescribed a mask change.  She is interested in potentially getting a secondary machine for travel.  She is advised to look into the ResMed mini.  There are some  ways to add moisture into the air, and may need an adapter.  I would be happy to prescribe a travel CPAP machine for her.  She is doing well with her Adderall, currently 10 mg twice daily, immediate release.  Her prescription was recently renewed.  Mood wise she feels stable and is in regular follow-up with her psychiatrist.  In the past, shehas seen a neuropsychologist.We did some workup for cognitive concerns and her  workup was reassuring in that regard. She had an EEG in December 2018 which was reported as normal. She had neuropsychological evaluation in the past with Dr. Valentina Shaggy on 08/08/2014 with benign results at the time.Shealsohad workup in 2032fr TIA concern.She sawDr.Bailar-Heath. She had sleep study testing in the form of CPAP titration full night followed by a MSLT the next dayin the past. Her CPAP pressure was adequate pressure of 9 cm during the sleep study, but she had an abnormal next day nap test, in keeping with with idiopathic hypersomnolence.She had a head CT without contrast and MRI brain without contrast on 04/18/2017 with benign results. Of note, she alsohada normal brain MRI in July 2015. Her physical and neurological exam have been nonfocal.   She is stable and doing well.  She is encouraged to get back on her CPAP consistently and advised to routinely follow-up in 1 year.  I answered all her questions today and she was in agreement.   I spent 20 minutes in total face-to-face time and in reviewing records during pre-charting, more than 50% of which was spent in counseling and coordination of care, reviewing test results, reviewing medications and treatment regimen and/or in discussing or reviewing the diagnosis of OSA, hypersomnolence, the prognosis and treatment options. Pertinent laboratory and imaging test results that were available during this visit with the patient were reviewed by me and considered in my medical decision making (see chart for details).

## 2020-09-16 NOTE — Patient Instructions (Signed)
It was good to see you again today.  Please continue with your CPAP, I have requested a mask change to a nasal cushion interface through your DME company.  You can also talk to them about getting a travel style CPAP machine, I have prescribed the ResMed mini before.  There are some mask options that would allow you to add some humidity to the travel CPAP machine which typically does not come with the humidifier chamber.  You can also talk to them about the antimicrobial CPAP machine tubing.  I can prescribe this if you like.  Your Adderall prescription is up-to-date.  Please follow-up routinely in 1 year, sooner if needed.

## 2020-09-25 ENCOUNTER — Ambulatory Visit (HOSPITAL_COMMUNITY): Payer: 59 | Admitting: Psychiatry

## 2020-09-25 ENCOUNTER — Other Ambulatory Visit: Payer: Self-pay

## 2020-10-01 ENCOUNTER — Encounter (HOSPITAL_COMMUNITY): Payer: Self-pay | Admitting: Psychiatry

## 2020-10-01 ENCOUNTER — Other Ambulatory Visit: Payer: Self-pay

## 2020-10-01 ENCOUNTER — Telehealth (INDEPENDENT_AMBULATORY_CARE_PROVIDER_SITE_OTHER): Payer: 59 | Admitting: Psychiatry

## 2020-10-01 DIAGNOSIS — F331 Major depressive disorder, recurrent, moderate: Secondary | ICD-10-CM

## 2020-10-01 DIAGNOSIS — F419 Anxiety disorder, unspecified: Secondary | ICD-10-CM | POA: Diagnosis not present

## 2020-10-01 MED ORDER — DULOXETINE HCL 60 MG PO CPEP: ORAL_CAPSULE | ORAL | 2 refills | Status: DC

## 2020-10-01 MED ORDER — BUPROPION HCL ER (XL) 300 MG PO TB24: 300.0000 mg | ORAL_TABLET | ORAL | 2 refills | Status: DC

## 2020-10-01 NOTE — Progress Notes (Signed)
Virtual Visit via Telephone Note  I connected with [name] [name] on 10/01/20 at  2:00 PM EDT by telephone and verified that I am speaking with the correct person using two identifiers.  Location: Patient: Home Provider: Home Office   I discussed the limitations, risks, security and privacy concerns of performing an evaluation and management service by telephone and the availability of in person appointments. I also discussed with the patient that there may be a patient responsible charge related to this service. The patient expressed understanding and agreed to proceed.   History of Present Illness: Patient is evaluated by phone call.  She is no longer taking Xanax and she is sleeping better.  She is taking melatonin that is helping her sleep.  She also uses CPAP.  She noticed since stopping Xanax she is feeling improved attention and concentration.  She also denies less anxiety and depression.  She is now working 2 days in person and rest of the day she is working from home.  She feels therapy with Gigi Gin is helping her a lot.  She is also taking Adderall from neurologist.  Patient works for a [employer].  She denies any crying spells, feeling of hopelessness or worthlessness.  She has no tremors, shakes or any EPS.  Recently she had a fall when she slipped on wet tile and seen in the emergency room.  Patient was pleased that she was discharged and told everything is normal.  Recently she is seeing neurologist.  She denies any panic attack or any nervousness.  She like to continue Wellbutrin and Cymbalta as it is helping her anxiety and depression.  Her appetite is okay.  Her weight is unchanged from the past.  Past psychiatric history; No h/oinpatient treatment or suicidal attempt. Noh/opsychosis,mania, orabuse. TookWellbutrin prescribed by PCP. Dose increased to 450 few years ago.We tried Lamictal but caused rash. Neurologist had prescribed Adderall to help her  memory.  Recent Results (from the past 2160 hour(s))  CBC     Status: Abnormal   Collection Time: 09/06/20 11:16 PM  Result Value Ref Range   WBC 6.0 4.0 - 10.5 K/uL   RBC 4.13 3.87 - 5.11 MIL/uL   Hemoglobin 13.4 12.0 - 15.0 g/dL   HCT 53.9 76.7 - 34.1 %   MCV 102.2 (H) 80.0 - 100.0 fL   MCH 32.4 26.0 - 34.0 pg   MCHC 31.8 30.0 - 36.0 g/dL   RDW 93.7 90.2 - 40.9 %   Platelets 258 150 - 400 K/uL   nRBC 0.0 0.0 - 0.2 %    Comment: Performed at [facility], [address], [place], [place] [address]  Basic metabolic panel     Status: None   Collection Time: 09/06/20 11:16 PM  Result Value Ref Range   Sodium 140 135 - 145 mmol/L   Potassium 4.0 3.5 - 5.1 mmol/L   Chloride 103 98 - 111 mmol/L   CO2 27 22 - 32 mmol/L   Glucose, Bld 90 70 - 99 mg/dL    Comment: Glucose reference range applies only to samples taken after fasting for at least 8 hours.   BUN 17 6 - 20 mg/dL   Creatinine, Ser 9.92 0.44 - 1.00 mg/dL   Calcium 9.5 8.9 - 42.6 mg/dL   GFR, Estimated >83 >41 mL/min    Comment: (NOTE) Calculated using the CKD-EPI Creatinine Equation (2021)    Anion gap 10 5 - 15    Comment: Performed at [facility], [address], [place], [place] [address]    Psychiatric Specialty Exam: Physical Exam  Review of Systems  Weight 145 lb (65.8 kg), last menstrual period 06/28/2000.There is no height or weight on file to calculate BMI.  General Appearance: NA  Eye Contact:  NA  Speech:  Normal Rate  Volume:  Normal  Mood:  Euthymic  Affect:  NA  Thought Process:  Goal Directed  Orientation:  Full (Time, Place, and Person)  Thought Content:  WDL  Suicidal Thoughts:  No  Homicidal Thoughts:  No  Memory:  Immediate;   Good Recent;   Good Remote;   Good  Judgement:  Intact  Insight:  Present  Psychomotor Activity:  NA  Concentration:  Concentration: Good and Attention Span: Good  Recall:  Good  Fund of Knowledge:  Good  Language:  Good   Akathisia:  No  Handed:  Right  AIMS (if indicated):     Assets:  Communication Skills Desire for Improvement Housing Resilience Talents/Skills Transportation  ADL's:  Intact  Cognition:  WNL  Sleep:   better      Assessment and Plan: Major depressive disorder, recurrent.  Anxiety.  I reviewed notes from neurology and recent ER visit and blood work results.  Patient doing better on current medication.  Continue Cymbalta 60 mg daily and Wellbutrin XL 300 mg daily.  Encouraged to keep appointment with Florencia Reasons.  She is taking Adderall from the neurologist.  Recommended to call us back if is any question or any concern.  Follow-up in 3 months.  Follow Up Instructions:    I discussed the assessment and treatment plan with the patient. The patient was provided an opportunity to ask questions and all were answered. The patient agreed with the plan and demonstrated an understanding of the instructions.   The patient was advised to call back or seek an in-person evaluation if the symptoms worsen or if the condition fails to improve as anticipated.  I provided 18 minutes of non-face-to-face time during this encounter.   Cleotis Nipper, MD

## 2020-10-09 ENCOUNTER — Other Ambulatory Visit: Payer: Self-pay

## 2020-10-09 ENCOUNTER — Encounter (HOSPITAL_COMMUNITY): Payer: Self-pay

## 2020-10-09 ENCOUNTER — Ambulatory Visit (INDEPENDENT_AMBULATORY_CARE_PROVIDER_SITE_OTHER): Payer: 59 | Admitting: Psychiatry

## 2020-10-09 DIAGNOSIS — F331 Major depressive disorder, recurrent, moderate: Secondary | ICD-10-CM

## 2020-10-09 NOTE — Progress Notes (Signed)
Virtual Visit via Video Note  I connected with [name] [name] on 10/09/20 at 4:12 PM EDT  by a video enabled telemedicine application and verified that I am speaking with the correct person using two identifiers.  Location: Patient: Home Provider: [facility] [place] office    I discussed the limitations of evaluation and management by telemedicine and the availability of in person appointments. The patient expressed understanding and agreed to proceed.  I provided 48  minutes of non-face-to-face time during this encounter.   [name] Salvage, LCSW  THERAPIST PROGRESS NOTE     Session Time:  Thursday 10/09/2020 4:12 PM - 5:00 PM Participation Level: Active  Behavioral Response: CasualAlert/ euthymic  Type of Therapy: Individual Therapy  Treatment Goals addressed:  Patient would like to decrease anxiety related to work and medical stressors to function at her best and advocate for self more assertiveness  Interventions: CBT and Supportive  Summary: [name] [name] is a 55 y.o. female who is referred for sevices for continuity of care by therapist Hilbert Odor while therapist is on temporary leave. She denies any psychiatric hospitalizations. She has been in outpatient therapy for a year and currently sees psychiatrist [name] for medication management.  She reports needing help with stress management mainly with work issues.  Patient reports being under a lot of stress due to work demands and says it has implications for her diabetes management.  She reports tendency to internalize thoughts and feelings. She reports frequent worry about not getting information timely from coworkers to perform her job.  Current symptoms include difficulty concentrating, fatigue, thoughts/feelings of hopelessness, irritability, sleep difficulty, tearfulness, fatigue, and excessive worry.      Patient last was seen via virtual visit about 3-4 weeks ago.  She states mood has been very upbeat since  last session.  She continues positive self-care. She has been socializing with family and friends. She states enjoying the recent weather and reports she has been trying to get out and do more things.  She reports using the thought logs have been very helpful and successfully challenging and replacing negative thoughts.  She also reports decreased frequency of negative thoughts.  She has been managing recent incidents on her job very well and cites examples.  She also is pleased with a recent positive performance evaluation on her job.  She also is pleased with her decreased use of Xanax and melatonin.  She reports decreased anxiety and positive sleep pattern.  Patient is very pleased with her progress in treatment and expresses confidence in her ability to use helpful coping strategies     Suicidal/Homicidal: Nowithout intent/plan  Therapist Response: reviewed symptoms, praised and reinforced patient's continued behavioral activation/socialization, praised and reinforced patient's use of thought log, discussed effects on mood/thought/behavior, processed log and assisted patient identify and address problematic areas regarding completing log, discussed lapse versus relapse of depression, assisted patient identify early warning signs of depression and ways to intervene, processed patient's feelings about termination, developed stepdown plan for termination to include 2 more sessions, will send  mindfulness and the window of tolerance handout in preparation for next session  Plan: Return again in 2  weeks.  Diagnosis: Axis I: MDD    Anxiety       [name] Salvage, LCSW 10/09/2020

## 2020-10-16 ENCOUNTER — Other Ambulatory Visit: Payer: Self-pay | Admitting: Neurology

## 2020-10-16 DIAGNOSIS — G4711 Idiopathic hypersomnia with long sleep time: Secondary | ICD-10-CM

## 2020-10-16 DIAGNOSIS — Z9989 Dependence on other enabling machines and devices: Secondary | ICD-10-CM

## 2020-10-16 DIAGNOSIS — G4733 Obstructive sleep apnea (adult) (pediatric): Secondary | ICD-10-CM

## 2020-10-16 MED ORDER — AMPHETAMINE-DEXTROAMPHETAMINE 10 MG PO TABS: 10.0000 mg | ORAL_TABLET | Freq: Two times a day (BID) | ORAL | 0 refills | Status: DC

## 2020-10-16 NOTE — Telephone Encounter (Signed)
Pt has called requesting a refill on Adderall 10 Per [place] drug registry last refill was 09/15/20 # 30 for a 60 day supply. Pt has been compliant with her f/us

## 2020-10-16 NOTE — Telephone Encounter (Signed)
Pt is needing a refill on her amphetamine-dextroamphetamine (ADDERALL) 10 MG tablet sent in to the [facility] on [address]

## 2020-10-23 ENCOUNTER — Ambulatory Visit (INDEPENDENT_AMBULATORY_CARE_PROVIDER_SITE_OTHER): Payer: 59 | Admitting: Psychiatry

## 2020-10-23 ENCOUNTER — Other Ambulatory Visit: Payer: Self-pay

## 2020-10-23 DIAGNOSIS — F331 Major depressive disorder, recurrent, moderate: Secondary | ICD-10-CM

## 2020-10-23 NOTE — Progress Notes (Signed)
Virtual Visit via Video Note  I connected with [name] [name] on 10/23/20 at 4:07 PM EDT  by a video enabled telemedicine application and verified that I am speaking with the correct person using two identifiers.  Location: Patient: Home Provider: [facility] [place] office    I discussed the limitations of evaluation and management by telemedicine and the availability of in person appointments. The patient expressed understanding and agreed to proceed.   I provided 53 minutes of non-face-to-face time during this encounter.   [name], LCSW  THERAPIST PROGRESS NOTE     Session Time:  Thursday 10/23/2020 4:07 PM - 5:00 PM  Participation Level: Active  Behavioral Response: CasualAlert/ euthymic  Type of Therapy: Individual Therapy  Treatment Goals addressed:  Patient would like to decrease anxiety related to work and medical stressors to function at her best and advocate for self more assertiveness  Interventions: CBT and Supportive  Summary: [name] [name] is a 55 y.o. female who is referred for sevices for continuity of care by therapist Hilbert Odor while therapist is on temporary leave. She denies any psychiatric hospitalizations. She has been in outpatient therapy for a year and currently sees psychiatrist [name] for medication management.  She reports needing help with stress management mainly with work issues.  Patient reports being under a lot of stress due to work demands and says it has implications for her diabetes management.  She reports tendency to internalize thoughts and feelings. She reports frequent worry about not getting information timely from coworkers to perform her job.  Current symptoms include difficulty concentrating, fatigue, thoughts/feelings of hopelessness, irritability, sleep difficulty, tearfulness, fatigue, and excessive worry.      Patient last was seen via virtual visit about 2 weeks ago.  She states doing well since last session.   She maintains positive self-care and involvement in activities.  She has continued using thought log and reports decreased negative thought patterns.  She reports being more assertive and cites recent example at work.  She is pleased with her response to her recent situation at work     Suicidal/Homicidal: Nature conservation officer Response: reviewed symptoms, praised and reinforced patient's continued behavioral activation/socialization, praised and reinforced patient's use of thought log, processed log, reviewed connection between thoughts/mood/behavior, praised and reinforced patient's use of assertiveness skills, discussed effects, provided psychoeducation on the use of mindfulness and an understanding of he window of tolerance to help regulate emotions, assisted patient identify and practice grounding techniques to use when outside the window of tolerance, develop plan with patient to practice grounding techniques between sessions   Plan: Return again in 2  weeks.  Diagnosis: Axis I: MDD    Anxiety       [name], LCSW 10/23/2020

## 2020-11-06 ENCOUNTER — Ambulatory Visit (INDEPENDENT_AMBULATORY_CARE_PROVIDER_SITE_OTHER): Payer: 59 | Admitting: Psychiatry

## 2020-11-06 ENCOUNTER — Other Ambulatory Visit: Payer: Self-pay

## 2020-11-06 DIAGNOSIS — F331 Major depressive disorder, recurrent, moderate: Secondary | ICD-10-CM

## 2020-11-06 NOTE — Progress Notes (Signed)
Virtual Visit via Video Note  I connected with [name] [name] on 11/06/20 at 4:07 PM EDT by a video enabled telemedicine application and verified that I am speaking with the correct person using two identifiers.  Location: Patient: Home Provider: [facility] [place] office    I discussed the limitations of evaluation and management by telemedicine and the availability of in person appointments. The patient expressed understanding and agreed to proceed.  I provided 48 minutes of non-face-to-face time during this encounter.   [name], LCSW THERAPIST PROGRESS NOTE     Session Time:  Thursday 11/06/2020 4:07 PM - 4:55 PM  Participation Level: Active  Behavioral Response: CasualAlert/ euthymic  Type of Therapy: Individual Therapy  Treatment Goals addressed:  Patient would like to decrease anxiety related to work and medical stressors to function at her best and advocate for self more assertiveness  Interventions: CBT and Supportive  Summary: [name] [name] is a 55 y.o. female who is referred for sevices for continuity of care by therapist Hilbert Odor while therapist is on temporary leave. She denies any psychiatric hospitalizations. She has been in outpatient therapy for a year and currently sees psychiatrist [name] for medication management.  She reports needing help with stress management mainly with work issues.  Patient reports being under a lot of stress due to work demands and says it has implications for her diabetes management.  She reports tendency to internalize thoughts and feelings. She reports frequent worry about not getting information timely from coworkers to perform her job.  Current symptoms include difficulty concentrating, fatigue, thoughts/feelings of hopelessness, irritability, sleep difficulty, tearfulness, fatigue, and excessive worry.      Patient last was seen via virtual visit about 2 weeks ago.  She reports continuing to do well since last  session.  She maintains positive self-care and involvement in activities.  She continues to manage stress at work  in a healthy way.  She reports reports being more aware of fluctuations in her mood and has become more observant of self regarding thoughts and feelings.  She has been using mindfulness and the window of tolerance to help regulate her emotions.  She has been using grounding techniques when outside the window of tolerance.  Patient reports continued successful use of assertiveness skills.       Suicidal/Homicidal: Nowithout intent/plan  Therapist Response: reviewed symptoms, praised and reinforced patient's increased self-awareness, discussed effects of using grounding techniques, discussed rationale for practicing mindfulness activities,  assisted patient practice mindfulness activities (eating mindfully, breath awareness), explored other activities to use to practice mindfulness, developed plan with patient to practice a mindfulness activity 5 to 10 minutes 1 time per day 4 to 5 days/week/, processed patient's feelings about upcoming termination at next session, will send patient handout (mental health maintenance plan) in preparation for next session  Plan: Return again in 2  weeks.  Diagnosis: Axis I: MDD    Anxiety       [name], LCSW 11/06/2020

## 2020-11-20 ENCOUNTER — Other Ambulatory Visit: Payer: Self-pay

## 2020-11-20 ENCOUNTER — Ambulatory Visit (INDEPENDENT_AMBULATORY_CARE_PROVIDER_SITE_OTHER): Payer: 59 | Admitting: Psychiatry

## 2020-11-20 DIAGNOSIS — F331 Major depressive disorder, recurrent, moderate: Secondary | ICD-10-CM | POA: Diagnosis not present

## 2020-11-20 NOTE — Progress Notes (Signed)
Virtual Visit via Video Note  I connected with [name] [name] on 11/20/20 at 2:08 PM EDT  by a video enabled telemedicine application and verified that I am speaking with the correct person using two identifiers.  Location: Patient: Home Provider: [facility] [place] office    I discussed the limitations of evaluation and management by telemedicine and the availability of in person appointments. The patient expressed understanding and agreed to proceed.  I provided 36  minutes of non-face-to-face time during this encounter.   [name], LCSW   THERAPIST PROGRESS NOTE     Session Time:  Thursday 11/20/2020 2:08 PM - 2:44 PM  Participation Level: Active  Behavioral Response: CasualAlert/ euthymic  Type of Therapy: Individual Therapy  Treatment Goals addressed:  Patient would like to decrease anxiety related to work and medical stressors to function at her best and advocate for self more assertiveness  Interventions: CBT and Supportive  Summary: [name] [name] is a 55 y.o. female who is referred for sevices for continuity of care by therapist Hilbert Odor while therapist is on temporary leave. She denies any psychiatric hospitalizations. She has been in outpatient therapy for a year and currently sees psychiatrist [name] for medication management.  She reports needing help with stress management mainly with work issues.  Patient reports being under a lot of stress due to work demands and says it has implications for her diabetes management.  She reports tendency to internalize thoughts and feelings. She reports frequent worry about not getting information timely from coworkers to perform her job.  Current symptoms include difficulty concentrating, fatigue, thoughts/feelings of hopelessness, irritability, sleep difficulty, tearfulness, fatigue, and excessive worry.      Patient last was seen via virtual visit about 2 weeks ago.  She reports continuing to do well since  last session.  She maintains positive self-care and involvement in activities.  She continues to manage stress at work  in a healthy way.  She continues to report being more aware of fluctuations in her mood and has become more observant of self regarding thoughts and feelings.   She has been using mindfulness and the window of tolerance to help regulate her emotions.  She has been using grounding techniques when outside the window of tolerance. She also has been practicing mindfulness activities regularly.   She reports increased assertiveness and positive behavior at work and shares recent examples. Patient is pleased with her progress in treatment and expresses confidence in her abilities to successfully use coping skills.       Suicidal/Homicidal: Nowithout intent/plan  Therapist Response: reviewed symptoms, praised and reinforced patient's increased self-awareness/use of  grounding techniques/ practicing mindfulness activities, discussed effects, discussed patient's progress in treatment, assisted patient develop mental health maintenance plan, did termination, encouraged patient to contact this practice should she need psychotherapy services in the future.  Patient will continue to see psychiatrist [name] for medication management  Plan:   Diagnosis: Axis I: MDD    Anxiety       [name], LCSW 11/20/2020    Outpatient Therapist Discharge Summary  [name] [name]    01-14-1966   Admission Date:  09/03/2019 Discharge Date:  11/20/2020 Reason for Discharge:  Accomplished Goals Diagnosis:  Axis I:  Moderate episode of recurrent major depressive disorder [facility])   Comments: Patient will continue to see psychiatrist [name] for medication management.  She is encouraged to contact this practice should she need psychotherapy services in the future.  [name] [name] E [name] Sewell  LCSW

## 2020-11-25 ENCOUNTER — Telehealth: Payer: Self-pay | Admitting: Neurology

## 2020-11-25 DIAGNOSIS — G4733 Obstructive sleep apnea (adult) (pediatric): Secondary | ICD-10-CM

## 2020-11-25 DIAGNOSIS — G4711 Idiopathic hypersomnia with long sleep time: Secondary | ICD-10-CM

## 2020-11-25 NOTE — Telephone Encounter (Signed)
[place] drug registry has been verified. Last refill was 10/16/20 # 60 for a 30 day supply.

## 2020-11-25 NOTE — Telephone Encounter (Signed)
Pt request refill amphetamine-dextroamphetamine (ADDERALL) 10 MG tablet at [facility] 347

## 2020-11-26 MED ORDER — AMPHETAMINE-DEXTROAMPHETAMINE 10 MG PO TABS: 10.0000 mg | ORAL_TABLET | Freq: Two times a day (BID) | ORAL | 0 refills | Status: DC

## 2020-12-02 ENCOUNTER — Encounter: Payer: Self-pay | Admitting: Podiatry

## 2020-12-02 ENCOUNTER — Other Ambulatory Visit: Payer: Self-pay

## 2020-12-02 ENCOUNTER — Ambulatory Visit: Payer: Managed Care, Other (non HMO) | Admitting: Podiatry

## 2020-12-02 DIAGNOSIS — K589 Irritable bowel syndrome without diarrhea: Secondary | ICD-10-CM | POA: Insufficient documentation

## 2020-12-02 DIAGNOSIS — R142 Eructation: Secondary | ICD-10-CM | POA: Insufficient documentation

## 2020-12-02 DIAGNOSIS — Z83719 Family history of colon polyps, unspecified: Secondary | ICD-10-CM | POA: Insufficient documentation

## 2020-12-02 DIAGNOSIS — R159 Full incontinence of feces: Secondary | ICD-10-CM | POA: Insufficient documentation

## 2020-12-02 DIAGNOSIS — R140 Abdominal distension (gaseous): Secondary | ICD-10-CM | POA: Insufficient documentation

## 2020-12-02 DIAGNOSIS — R141 Gas pain: Secondary | ICD-10-CM | POA: Insufficient documentation

## 2020-12-02 DIAGNOSIS — K625 Hemorrhage of anus and rectum: Secondary | ICD-10-CM | POA: Insufficient documentation

## 2020-12-02 DIAGNOSIS — R194 Change in bowel habit: Secondary | ICD-10-CM | POA: Insufficient documentation

## 2020-12-02 DIAGNOSIS — Z8371 Family history of colonic polyps: Secondary | ICD-10-CM | POA: Insufficient documentation

## 2020-12-02 DIAGNOSIS — K5904 Chronic idiopathic constipation: Secondary | ICD-10-CM | POA: Insufficient documentation

## 2020-12-02 DIAGNOSIS — L603 Nail dystrophy: Secondary | ICD-10-CM

## 2020-12-03 NOTE — Progress Notes (Signed)
She presents today chief concern of the hallux left she noticed there was a split in it last week and she is concerned about fungus.  She continues to use nail polish with a tea tree oil in it.  She is a diabetic so she just wanted make sure that it was no correlation.  Objective: Vital signs are stable she is alert and oriented x3 pulses are palpable.  Neurologic sensorium is grossly intact.  Muscle strength grossly intact bilateral.  Cutaneous evaluation demonstrates supple well-hydrated cutis everything was very nice clear and clean she also has a small transverse crack to the tibial border of the left hallux.  There is some thickening in this area with some subungual debris present.  The remainder of the toenails appear to have a nice clean free edge.  Assessment: Cannot rule out onychomycosis but it more than likely is nail trauma in this area.   Diabetes mellitus uncomplicated.  Plan: Samples of the nail were taken today and I will follow-up with her in 1 month.

## 2020-12-22 NOTE — Progress Notes (Signed)
Called and gave information to patient,verbalized understanding.

## 2020-12-26 ENCOUNTER — Other Ambulatory Visit: Payer: Self-pay | Admitting: Neurology

## 2020-12-26 DIAGNOSIS — G4733 Obstructive sleep apnea (adult) (pediatric): Secondary | ICD-10-CM

## 2020-12-26 DIAGNOSIS — G4711 Idiopathic hypersomnia with long sleep time: Secondary | ICD-10-CM

## 2020-12-26 NOTE — Telephone Encounter (Signed)
Pt requesting refill for amphetamine-dextroamphetamine (ADDERALL) 10 MG tablet. Pharmacy [facility] [phone], please advise.

## 2020-12-31 ENCOUNTER — Telehealth: Payer: Self-pay | Admitting: *Deleted

## 2020-12-31 MED ORDER — AMPHETAMINE-DEXTROAMPHETAMINE 10 MG PO TABS: 10.0000 mg | ORAL_TABLET | Freq: Two times a day (BID) | ORAL | 0 refills | Status: DC

## 2020-12-31 NOTE — Telephone Encounter (Signed)
[place] drug registry has been verified. Last refill was 11/26/20 # 60 for a 30 supply.  Pt has been compliant with f/u appt.

## 2020-12-31 NOTE — Telephone Encounter (Signed)
Patient is calling and wanted to know if her upcoming appointment is necessary since labs were fine? Please advise.

## 2020-12-31 NOTE — Telephone Encounter (Signed)
Returned call to patient and informed that she may cancel,she is cancelling upcoming appointment.

## 2020-12-31 NOTE — Addendum Note (Signed)
Addended by: Ann Maki on: 12/31/2020 09:42 AM   Modules accepted: Orders

## 2021-01-01 ENCOUNTER — Encounter (HOSPITAL_COMMUNITY): Payer: Self-pay | Admitting: Psychiatry

## 2021-01-01 ENCOUNTER — Telehealth (INDEPENDENT_AMBULATORY_CARE_PROVIDER_SITE_OTHER): Payer: 59 | Admitting: Psychiatry

## 2021-01-01 ENCOUNTER — Ambulatory Visit: Payer: Managed Care, Other (non HMO) | Admitting: Podiatry

## 2021-01-01 ENCOUNTER — Other Ambulatory Visit: Payer: Self-pay

## 2021-01-01 DIAGNOSIS — F419 Anxiety disorder, unspecified: Secondary | ICD-10-CM | POA: Diagnosis not present

## 2021-01-01 DIAGNOSIS — F331 Major depressive disorder, recurrent, moderate: Secondary | ICD-10-CM

## 2021-01-01 MED ORDER — DULOXETINE HCL 60 MG PO CPEP: ORAL_CAPSULE | ORAL | 2 refills | Status: DC

## 2021-01-01 MED ORDER — BUPROPION HCL ER (XL) 300 MG PO TB24
300.0000 mg | ORAL_TABLET | ORAL | 2 refills | Status: DC
Start: 2021-01-01 — End: 2021-04-02

## 2021-01-01 NOTE — Progress Notes (Signed)
Virtual Visit via Telephone Note  I connected with [name] [name] on 01/01/21 at  2:20 PM EDT by telephone and verified that I am speaking with the correct person using two identifiers.  Location: Patient: Home Provider: Home Office   I discussed the limitations, risks, security and privacy concerns of performing an evaluation and management service by telephone and the availability of in person appointments. I also discussed with the patient that there may be a patient responsible charge related to this service. The patient expressed understanding and agreed to proceed.   History of Present Illness: Patient is evaluated by phone session.  She is doing very well on the combination of Wellbutrin and Cymbalta.  Occasionally she takes Xanax but has not requested a new refill.  She had a last session with Gigi Gin Bynum few weeks ago.  Her job is going well.  She is less anxious as she is working few days from home.  She works for a [employer].  She denies any crying spells or any feeling of hopelessness.  She has a better control on her symptoms and she feels the medicines are working well and she is able to manage her symptoms much better.  She is sleeping most of the night school.  She has no tremors shakes or any EPS.  She is taking Adderall from the neurologist.  Patient has no tremor or shakes or any EPS.  She had understanding with Florencia Reasons that if she need therapy then she can go back to schedule appointment.  Her appetite is okay.  Her weight is stable.   Past psychiatric history; No h/o inpatient treatment or suicidal attempt.  No h/o psychosis, mania, or abuse.  Took Wellbutrin prescribed by PCP.  Dose increased to 450 few years ago.  We tried Lamictal but caused rash.  Neurologist had prescribed Adderall to help her memory.   Psychiatric Specialty Exam: Physical Exam  Review of Systems  Weight 142 lb (64.4 kg), last menstrual period 06/28/2000.Body mass index is 24.37 kg/m.   General Appearance: NA  Eye Contact:  NA  Speech:  Clear and Coherent  Volume:  Normal  Mood:  Euthymic  Affect:  NA  Thought Process:  Coherent  Orientation:  Full (Time, Place, and Person)  Thought Content:  WDL  Suicidal Thoughts:  No  Homicidal Thoughts:  No  Memory:  Immediate;   Good Recent;   Good Remote;   Good  Judgement:  Good  Insight:  Present  Psychomotor Activity:  NA  Concentration:  Concentration: Good and Attention Span: Good  Recall:  Good  Fund of Knowledge:  Good  Language:  Good  Akathisia:  No  Handed:  Right  AIMS (if indicated):     Assets:  Communication Skills Desire for Improvement Housing Resilience Social Support Talents/Skills Transportation  ADL's:  Intact  Cognition:  WNL  Sleep:   better     Assessment and Plan: Major depressive disorder, recurrent.  Anxiety.  Patient is stable on the current dose of antidepressant.  We will continue Cymbalta 60 mg daily and Wellbutrin XL 300 mg daily.  She has taken few times Xanax but he still has refill remaining and if needed then she can call us.  Recommended to call us back if she is any question or any concern.  Follow-up in 3 months.  Follow Up Instructions:    I discussed the assessment and treatment plan with the patient. The patient was provided an opportunity to ask questions and  all were answered. The patient agreed with the plan and demonstrated an understanding of the instructions.   The patient was advised to call back or seek an in-person evaluation if the symptoms worsen or if the condition fails to improve as anticipated.  I provided 19 minutes of non-face-to-face time during this encounter.   Cleotis Nipper, MD

## 2021-01-28 IMAGING — US ULTRASOUND LEFT BREAST LIMITED
1 series · 4 of 4 positions shown · non-contrast
Comparison: Previous exam(s).

CLINICAL DATA: Short-term follow-up for a probably benign 4 mm mass
in the outer left breast. This was initially evaluated 06/30/2018.

EXAM:
DIGITAL DIAGNOSTIC LEFT MAMMOGRAM WITH CAD AND TOMO
ULTRASOUND LEFT BREAST

[Series 1: ultrasound left breast limited · 0.05mm/px · 4 of 4 slices shown]
[im 1/4]
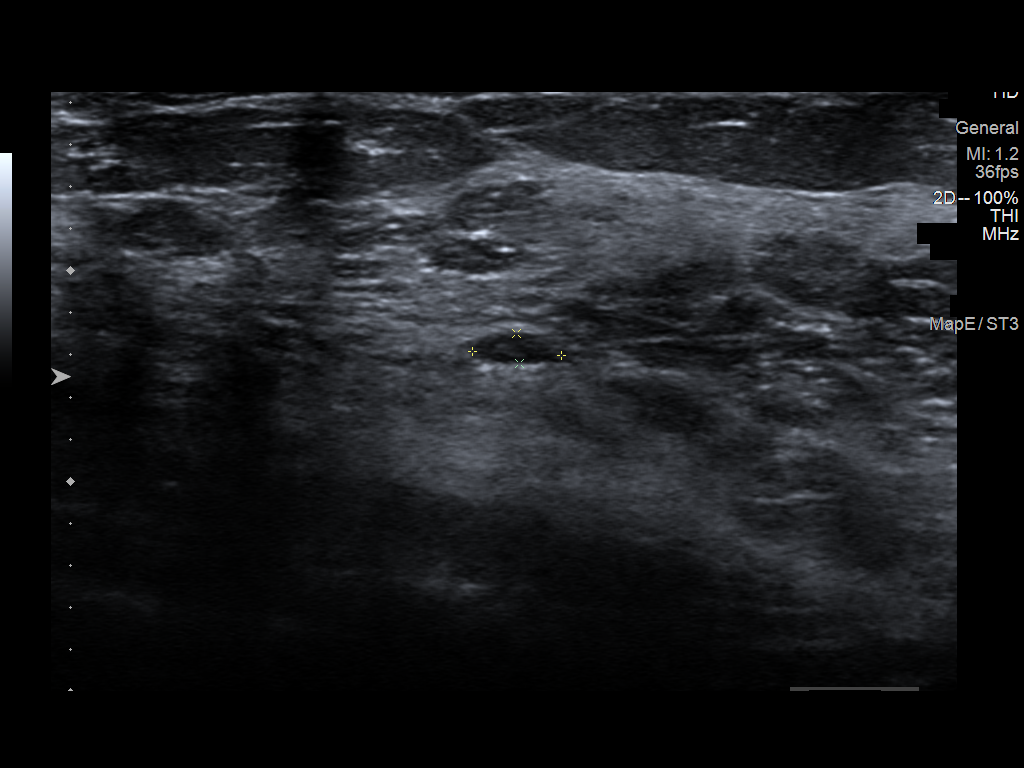
[im 2/4]
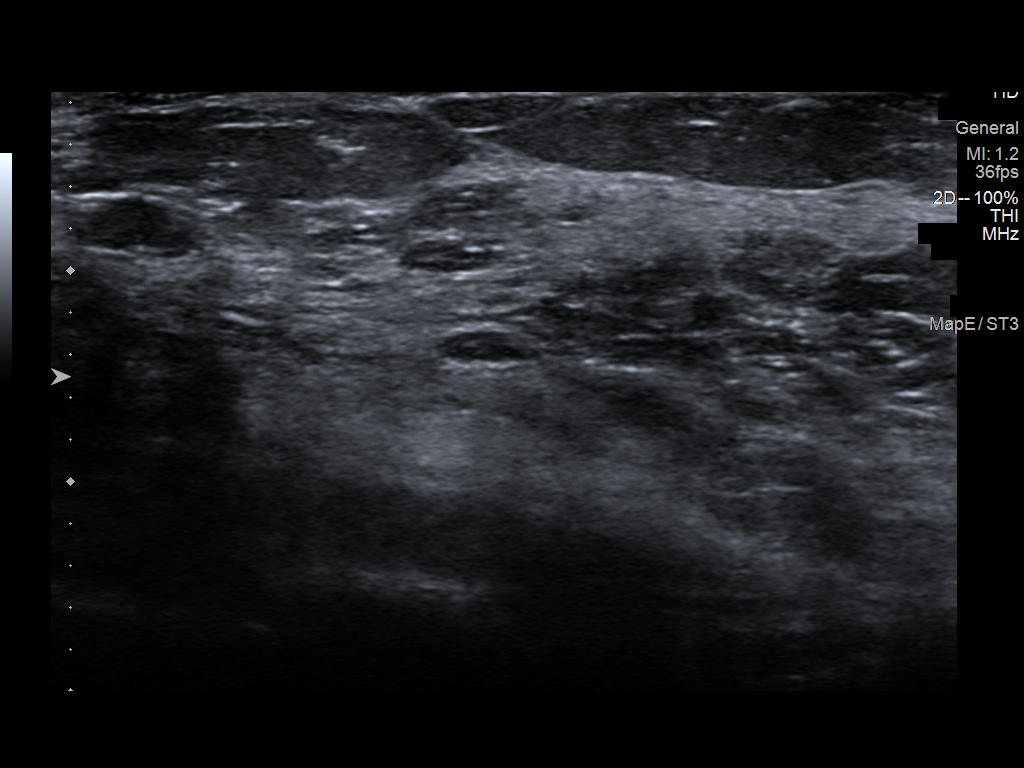
[im 3/4]
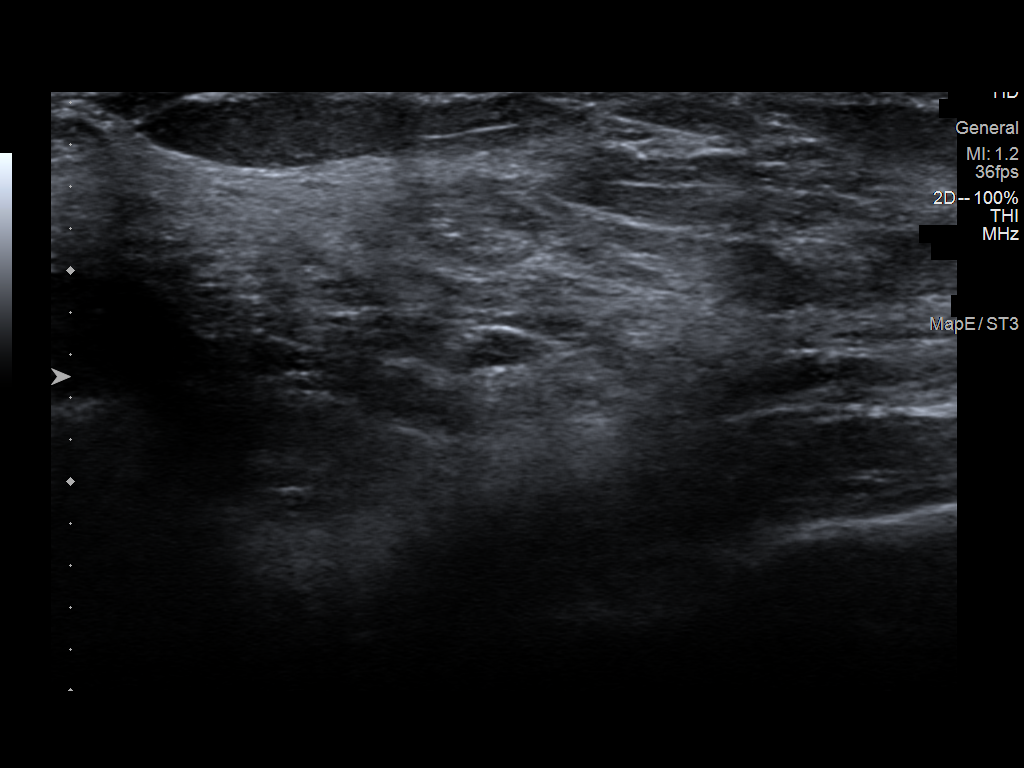
[im 4/4]
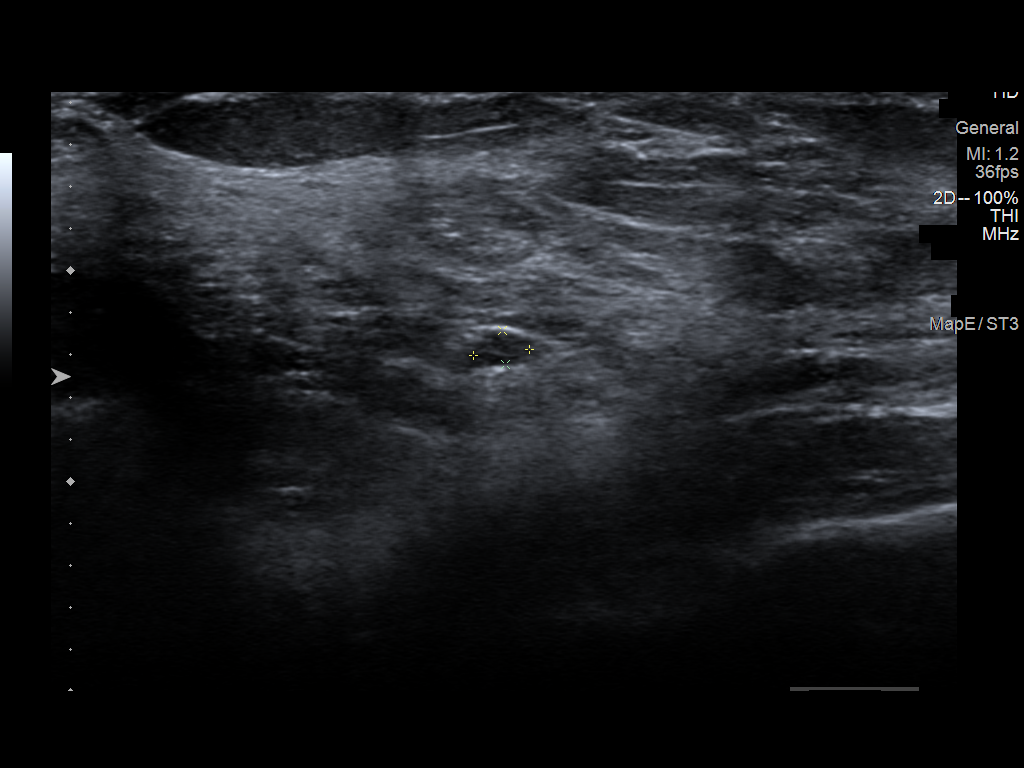

[4 of 4 positions shown; findings below may reference images not displayed]

ACR Breast Density Category c: The breast tissue is heterogeneously
dense, which may obscure small masses.
FINDINGS: The small mass in the lateral left breast is unchanged. There are no
new masses, no areas of architectural distortion and no suspicious
calcifications.

Mammographic images were processed with CAD.

Targeted ultrasound is performed, showing an oval, circumscribed,
mostly anechoic parallel mass in the left breast at 3 o'clock, 5 cm
the nipple. This contains some internal echoes. It measures 4 x 2 x
3 mm, unchanged from the prior exam.
IMPRESSION: 1. Probably benign 4 mm mass in the lateral left breast, stable
since June 2018. This is likely a mildly complicated cyst.
2. No new abnormalities.

RECOMMENDATION:
Diagnostic mammography and left breast ultrasound in 6 months.

I have discussed the findings and recommendations with the patient.
Results were also provided in writing at the conclusion of the
visit. If applicable, a reminder letter will be sent to the patient
regarding the next appointment.

BI-RADS CATEGORY  3: Probably benign.

## 2021-02-05 ENCOUNTER — Other Ambulatory Visit: Payer: Self-pay

## 2021-02-05 ENCOUNTER — Ambulatory Visit (INDEPENDENT_AMBULATORY_CARE_PROVIDER_SITE_OTHER): Payer: 59 | Admitting: Psychiatry

## 2021-02-05 DIAGNOSIS — F331 Major depressive disorder, recurrent, moderate: Secondary | ICD-10-CM

## 2021-02-05 NOTE — Progress Notes (Signed)
Virtual Visit via Video Note  I connected with [name] [name] on 02/05/21 at 3:09 PM EDT  by a video enabled telemedicine application and verified that I am speaking with the correct person using two identifiers.  Location: Patient: Home Provider: [facility] [place] office    I discussed the limitations of evaluation and management by telemedicine and the availability of in person appointments. The patient expressed understanding and agreed to proceed.  I provided 41 minutes of non-face-to-face time during this encounter.   [name], LCSW  THERAPIST PROGRESS NOTE     Session Time:  Thursday 02/05/2021 3:09 PM - 3:50 PM   Participation Level: Active  Behavioral Response: CasualAlert/anxious, tearful,sad  Type of Therapy: Individual Therapy  Treatment Goals addressed:  Patient would like to decrease anxiety related to work and medical stressors to function at her best and advocate for self more assertiveness  Interventions: CBT and Supportive  Summary: [name] [name] is a 55 y.o. female who is referred for sevices for continuity of care by therapist Hilbert Odor while therapist is on temporary leave. She denies any psychiatric hospitalizations. She has been in outpatient therapy for a year and currently sees psychiatrist [name] for medication management.  She reports needing help with stress management mainly with work issues.  Patient reports being under a lot of stress due to work demands and says it has implications for her diabetes management.  She reports tendency to internalize thoughts and feelings. She reports frequent worry about not getting information timely from coworkers to perform her job.  Current symptoms include difficulty concentrating, fatigue, thoughts/feelings of hopelessness, irritability, sleep difficulty, tearfulness, fatigue, and excessive worry.      Patient last was seen via virtual visit about 2 1/2 months ago.  She is returning for  services today as she has been experiencing stress and anxiety.  She also has begun to have thoughts of self-doubt.  Per patient's report, there has been a reorganization at her job.  She expresses frustration and sadness as she was not given additional responsibilities or a promotion.  However, her coworkers were given promotions.  She also expresses worry about now being the only one in her department.  She fears her health and management of diabetes will be adversely affected as it was when a similar situation occurred on her job a few years ago.  Patient also expresses worry and frustration she has been unable to find another job.    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  Therapist and patient agreed patient will call and schedule another appointment if needed.  Diagnosis: Axis I: MDD    Anxiety       [name], LCSW 02/05/2021

## 2021-02-11 ENCOUNTER — Other Ambulatory Visit: Payer: Self-pay | Admitting: Neurology

## 2021-02-11 DIAGNOSIS — Z9989 Dependence on other enabling machines and devices: Secondary | ICD-10-CM

## 2021-02-11 DIAGNOSIS — G4733 Obstructive sleep apnea (adult) (pediatric): Secondary | ICD-10-CM

## 2021-02-11 DIAGNOSIS — G4711 Idiopathic hypersomnia with long sleep time: Secondary | ICD-10-CM

## 2021-02-11 MED ORDER — AMPHETAMINE-DEXTROAMPHETAMINE 10 MG PO TABS: 10.0000 mg | ORAL_TABLET | Freq: Two times a day (BID) | ORAL | 0 refills | Status: DC

## 2021-02-11 NOTE — Telephone Encounter (Signed)
amphetamine-dextroamphetamine (ADDERALL) 10 MG tablet to [facility]

## 2021-02-11 NOTE — Telephone Encounter (Signed)
Pt requesting refill on Adderall 10 mg. Per [place] drug registry last refill was 12/31/20 # 30 for a 30 day supply.

## 2021-02-11 NOTE — Addendum Note (Signed)
Addended by: Ann Maki on: 02/11/2021 03:14 PM   Modules accepted: Orders

## 2021-03-18 ENCOUNTER — Other Ambulatory Visit: Payer: Self-pay | Admitting: Neurology

## 2021-03-18 DIAGNOSIS — G4733 Obstructive sleep apnea (adult) (pediatric): Secondary | ICD-10-CM

## 2021-03-18 DIAGNOSIS — G4711 Idiopathic hypersomnia with long sleep time: Secondary | ICD-10-CM

## 2021-03-18 MED ORDER — AMPHETAMINE-DEXTROAMPHETAMINE 10 MG PO TABS: 10.0000 mg | ORAL_TABLET | Freq: Two times a day (BID) | ORAL | 0 refills | Status: DC

## 2021-03-18 NOTE — Telephone Encounter (Signed)
Per [place] registry, last filled on 02/11/2021 Dextroamp-Amphetamin 10 Mg Tab 60/30 written by Dr Frances Furbish. Pt has pending appt in 6 months. Rx refill sent Dr Frances Furbish to [facility].

## 2021-03-18 NOTE — Telephone Encounter (Signed)
Pt is needing a refill request on her amphetamine-dextroamphetamine (ADDERALL) 10 MG tablet sent in to the [facility] on [address]

## 2021-04-02 ENCOUNTER — Telehealth (HOSPITAL_BASED_OUTPATIENT_CLINIC_OR_DEPARTMENT_OTHER): Payer: 59 | Admitting: Psychiatry

## 2021-04-02 ENCOUNTER — Encounter (HOSPITAL_COMMUNITY): Payer: Self-pay | Admitting: Psychiatry

## 2021-04-02 ENCOUNTER — Other Ambulatory Visit: Payer: Self-pay

## 2021-04-02 DIAGNOSIS — F419 Anxiety disorder, unspecified: Secondary | ICD-10-CM

## 2021-04-02 DIAGNOSIS — F331 Major depressive disorder, recurrent, moderate: Secondary | ICD-10-CM | POA: Diagnosis not present

## 2021-04-02 MED ORDER — BUPROPION HCL ER (XL) 300 MG PO TB24: 300.0000 mg | ORAL_TABLET | ORAL | 2 refills | Status: DC

## 2021-04-02 MED ORDER — DULOXETINE HCL 60 MG PO CPEP: ORAL_CAPSULE | ORAL | 2 refills | Status: DC

## 2021-04-02 NOTE — Progress Notes (Signed)
Virtual Visit via Telephone Note  I connected with [name] [name] on 04/02/21 at  4:00 PM EDT by telephone and verified that I am speaking with the correct person using two identifiers.  Location: Patient: home Provider: home office   I discussed the limitations, risks, security and privacy concerns of performing an evaluation and management service by telephone and the availability of in person appointments. I also discussed with the patient that there may be a patient responsible charge related to this service. The patient expressed understanding and agreed to proceed.   History of Present Illness: Patient is evaluated by phone session.  She admitted increased stress from the work and she is now regularly talking to [place].  Sometimes she has a lot of racing thoughts about her job before she go to sleep.  She admitted taking Xanax 0.25 mg almost daily to calm her down.  She had decided to find a new job and she is actively looking for it.  She feels the combination of medicine is helping her and occasionally she has crying spells but denies any paranoia, hallucination, suicidal thoughts.  Her appetite is okay.  She admitted maybe 1 to 2 pound weight gain because she is not exercising as she feels overwhelmed with her job situation.  She feels fatigued, tired.  She feels talking to Gigi Gin helps and hoping to find a better job very soon.  She has no tremors, shakes or any EPS.  Past psychiatric history; No h/o inpatient treatment or suicidal attempt.  No h/o psychosis, mania, or abuse.  Took Wellbutrin prescribed by PCP.  Dose increased to 450 few years ago.  We tried Lamictal but caused rash.  Neurologist had prescribed Adderall to help her memory.  Psychiatric Specialty Exam: Physical Exam  Review of Systems  Weight 147 lb (66.7 kg), last menstrual period 06/28/2000.There is no height or weight on file to calculate BMI.  General Appearance: NA  Eye Contact:  NA  Speech:  Normal Rate  Volume:   Normal  Mood:  Anxious  Affect:  NA  Thought Process:  Goal Directed  Orientation:  Full (Time, Place, and Person)  Thought Content:  Rumination  Suicidal Thoughts:  No  Homicidal Thoughts:  No  Memory:  Immediate;   Good Recent;   Good Remote;   Good  Judgement:  Intact  Insight:  Present  Psychomotor Activity:  NA  Concentration:  Concentration: Good and Attention Span: Good  Recall:  Good  Fund of Knowledge:  Good  Language:  Good  Akathisia:  No  Handed:  Right  AIMS (if indicated):     Assets:  Communication Skills Desire for Improvement Housing Resilience Social Support Transportation  ADL's:  Intact  Cognition:  WNL  Sleep:   fair, racing thought      Assessment and Plan: Major depressive disorder, recurrent.  Anxiety.  Discuss stress related to her work.  Encouraged to keep appointment with Florencia Reasons.  Patient is taking Xanax almost daily 0.25 mg to calm herself.  She is hoping to find a new job soon.  Continue Cymbalta 60 mg daily and Wellbutrin XL 300 mg daily.  Her last Hemoglobin A1c was 6.3 which was done in August.  Recommended to call us back if she has any question or any concern.  Follow-up in 3 months.  Follow Up Instructions:    I discussed the assessment and treatment plan with the patient. The patient was provided an opportunity to ask questions and all were answered.  The patient agreed with the plan and demonstrated an understanding of the instructions.   The patient was advised to call back or seek an in-person evaluation if the symptoms worsen or if the condition fails to improve as anticipated.  I provided 19 minutes of non-face-to-face time during this encounter.   Cleotis Nipper, MD

## 2021-04-20 ENCOUNTER — Other Ambulatory Visit: Payer: Self-pay

## 2021-04-20 ENCOUNTER — Ambulatory Visit (INDEPENDENT_AMBULATORY_CARE_PROVIDER_SITE_OTHER): Payer: 59 | Admitting: Psychiatry

## 2021-04-20 DIAGNOSIS — F331 Major depressive disorder, recurrent, moderate: Secondary | ICD-10-CM

## 2021-04-20 NOTE — Progress Notes (Signed)
Virtual Visit via Video Note  I connected with [name] [name] on 04/20/21 at  3:00 PM EDT by a video enabled telemedicine application and verified that I am speaking with the correct person using two identifiers.  Location: Patient:  Home Provider: [facility] [place] office    I discussed the limitations of evaluation and management by telemedicine and the availability of in person appointments. The patient expressed understanding and agreed to proceed.   I provided 50 minutes of non-face-to-face time during this encounter.   [name], LCSW   THERAPIST PROGRESS NOTE     Session Time:  Monday  04/20/2021 3:00 PM - 3:50 PM   Participation Level: Active  Behavioral Response: CasualAlert/anxious, tearful,sad  Type of Therapy: Individual Therapy  Treatment Goals addressed:  Patient would like to decrease anxiety related to work and medical stressors to function at her best and advocate for self more assertiveness  Interventions: CBT and Supportive  Summary: [name] [name] is a 55 y.o. female who is referred for sevices for continuity of care by therapist Hilbert Odor while therapist is on temporary leave. She denies any psychiatric hospitalizations. She has been in outpatient therapy for a year and currently sees psychiatrist [name] for medication management.  She reports needing help with stress management mainly with work issues.  Patient reports being under a lot of stress due to work demands and says it has implications for her diabetes management.  She reports tendency to internalize thoughts and feelings. She reports frequent worry about not getting information timely from coworkers to perform her job.  Current symptoms include difficulty concentrating, fatigue, thoughts/feelings of hopelessness, irritability, sleep difficulty, tearfulness, fatigue, and excessive worry.      Patient last was seen via virtual visit about 2 1/2 months ago.  She is returning for  services today as she is experiencing increased stress and anxiety regarding her job.  She reports diminished interest/pleasure in activities, decreased social involvement, fatigue, and ruminating thoughts.  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  Patient worries about her performance and has what if thoughts.  She continues to look for another job but expresses frustration she has been unable to find another position.  She is excited she is planning to start college next semester to complete her degree.   Suicidal/Homicidal: Nowithout intent/plan  Therapist Response: reviewed symptoms, praised and reinforced patient's early recognition of her symptoms and scheduling an appointment with therapist, discussed stressors, facilitated expression of thoughts and feelings, validated feelings, assisted patient examine her thought patterns and effects on her behavior/mood, assisted patient examine what if thoughts use and worry exploration questions, assisted patient identify how she would manage if her worst fears came true, discussed what likely would happen versus what could happen, assisted patient identify realistic expectations of self, reviewed ways to increase involvement in recreational and social activities, reviewed work/life balance  Plan: Return in 2 weeks  Diagnosis: Axis I: MDD    Anxiety       [name], LCSW 04/20/2021

## 2021-04-23 ENCOUNTER — Other Ambulatory Visit: Payer: Self-pay | Admitting: Neurology

## 2021-04-23 DIAGNOSIS — G4711 Idiopathic hypersomnia with long sleep time: Secondary | ICD-10-CM

## 2021-04-23 DIAGNOSIS — G4733 Obstructive sleep apnea (adult) (pediatric): Secondary | ICD-10-CM

## 2021-04-23 MED ORDER — AMPHETAMINE-DEXTROAMPHETAMINE 10 MG PO TABS: 10.0000 mg | ORAL_TABLET | Freq: Two times a day (BID) | ORAL | 0 refills | Status: DC

## 2021-04-23 NOTE — Telephone Encounter (Signed)
Pt request refill for amphetamine-dextroamphetamine (ADDERALL) 10 MG tablet at [facility] [id number]

## 2021-04-23 NOTE — Telephone Encounter (Addendum)
Pt up to date on office visits. Has one pending. Per [place] registry, last filled this medication on 03/18/2021 #60/30. Rx request sent to Dr Vickey Huger (work-in as Dr Frances Furbish is out).

## 2021-05-11 ENCOUNTER — Other Ambulatory Visit: Payer: Self-pay

## 2021-05-11 ENCOUNTER — Ambulatory Visit (INDEPENDENT_AMBULATORY_CARE_PROVIDER_SITE_OTHER): Payer: 59 | Admitting: Psychiatry

## 2021-05-11 DIAGNOSIS — F331 Major depressive disorder, recurrent, moderate: Secondary | ICD-10-CM | POA: Diagnosis not present

## 2021-05-11 NOTE — Progress Notes (Signed)
Virtual Visit via Video Note  I connected with [name] [name] on 05/11/21 at 8:42 AM EDT by a video enabled telemedicine application and verified that I am speaking with the correct person using two identifiers.  Location: Patient: Home Provider: [facility] [place] [facility]    I discussed the limitations of evaluation and management by telemedicine and the availability of in person appointments. The patient expressed understanding and agreed to proceed.  I provided 20 minutes of non-face-to-face time during this encounter.   [name], LCSW THERAPIST PROGRESS NOTE     Session Time:  Monday  11/142022 8:42 AM  - 9:02 AM   Participation Level: Active  Behavioral Response: CasualAlert/pleasant, euthymic  Type of Therapy: Individual Therapy  Treatment Goals addressed:  Patient would like to decrease anxiety related to work and medical stressors to function at her best and advocate for self more assertiveness  Interventions: CBT and Supportive  Summary: [name] [name] is a 55 y.o. female who is referred for sevices for continuity of care by therapist Hilbert Odor while therapist is on temporary leave. She denies any psychiatric hospitalizations. She has been in outpatient therapy for a year and currently sees psychiatrist [name] for medication management.  She reports needing help with stress management mainly with work issues.  Patient reports being under a lot of stress due to work demands and says it has implications for her diabetes management.  She reports tendency to internalize thoughts and feelings. She reports frequent worry about not getting information timely from coworkers to perform her job.  Current symptoms include difficulty concentrating, fatigue, thoughts/feelings of hopelessness, irritability, sleep difficulty, tearfulness, fatigue, and excessive worry.      Patient last was seen via virtual visit about 2 weeks ago.  Patient reports improved mood and  increased behavioral activation since last session.  Per her report, she has been more assertive and setting boundaries at work.  She is very pleased with her efforts as she recently spoke with her manager regarding concerns about work reports receiving positive response.  Patient also has more realistic expectations of self and reports being able to let things go there now within her control.  She has set priorities regarding her job.  Patient also has blocked time on her calendar at work.  She reports improved self-care including walking and positive sleep pattern.  She has been engaging in pleasurable activities including walking with a friend, reading, knitting, and listening to music.  Patient is very pleased with her progress.  She remains excited about starting college in January.    Suicidal/Homicidal: Nowithout intent/plan  Therapist Response: reviewed symptoms, praised and reinforced improved use of assertiveness skills/increased behavioral activation/realistic expectations of self, discussed effects, reviewed connection between thoughts/mood/behavior, reviewed triggers of core beliefs and ways to address, developed plan with patient to maintain positive self-care and review previous handouts regarding thought patterns  Plan: Return in 1 month  Diagnosis: Axis I: MDD    Anxiety       [name], LCSW 05/11/2021

## 2021-06-01 ENCOUNTER — Other Ambulatory Visit: Payer: Self-pay

## 2021-06-01 ENCOUNTER — Ambulatory Visit (INDEPENDENT_AMBULATORY_CARE_PROVIDER_SITE_OTHER): Payer: 59 | Admitting: Psychiatry

## 2021-06-01 ENCOUNTER — Other Ambulatory Visit: Payer: Self-pay | Admitting: Neurology

## 2021-06-01 DIAGNOSIS — Z9989 Dependence on other enabling machines and devices: Secondary | ICD-10-CM

## 2021-06-01 DIAGNOSIS — F331 Major depressive disorder, recurrent, moderate: Secondary | ICD-10-CM

## 2021-06-01 DIAGNOSIS — G4711 Idiopathic hypersomnia with long sleep time: Secondary | ICD-10-CM

## 2021-06-01 DIAGNOSIS — G4733 Obstructive sleep apnea (adult) (pediatric): Secondary | ICD-10-CM

## 2021-06-01 MED ORDER — AMPHETAMINE-DEXTROAMPHETAMINE 10 MG PO TABS: 10.0000 mg | ORAL_TABLET | Freq: Two times a day (BID) | ORAL | 0 refills | Status: DC

## 2021-06-01 NOTE — Progress Notes (Signed)
Virtual Visit via Video Note  I connected with [name] [name] on 06/01/21 at 3:15 PM EST  by a video enabled telemedicine application and verified that I am speaking with the correct person using two identifiers.  Location: Patient: Home Provider: [facility] [place] office    I discussed the limitations of evaluation and management by telemedicine and the availability of in person appointments. The patient expressed understanding and agreed to proceed.  I provided 43 minutes of non-face-to-face time during this encounter.   [name], LCSW     Session Time:  Monday  06/01/2021 3:15 PM - 3:58 PM   Participation Level: Active  Behavioral Response: CasualAlert/pleasant, tearful  Type of Therapy: Individual Therapy  Treatment Goals addressed:  Patient would like to decrease anxiety related to work and medical stressors to function at her best and advocate for self more assertiveness  Interventions: CBT and Supportive  Summary: [name] [name] is a 55 y.o. female who is referred for sevices for continuity of care by therapist Hilbert Odor while therapist is on temporary leave. She denies any psychiatric hospitalizations. She has been in outpatient therapy for a year and currently sees psychiatrist [name] for medication management.  She reports needing help with stress management mainly with work issues.  Patient reports being under a lot of stress due to work demands and says it has implications for her diabetes management.  She reports tendency to internalize thoughts and feelings. She reports frequent worry about not getting information timely from coworkers to perform her job.  Current symptoms include difficulty concentrating, fatigue, thoughts/feelings of hopelessness, irritability, sleep difficulty, tearfulness, fatigue, and excessive worry.      Patient last was seen via virtual visit about 2- 3 weeks ago.  Patient reports increased stress and anxiety since last  session.  Per patient's report, she has received more unrealistic work demands.  She also pressures self and has begun to experience negative ruminating thoughts.  She reports decreased interest and involvement in activities along with working excessive hours.  She was talking to her supervisor but does not feel supported.   Suicidal/Homicidal: Nowithout intent/plan  Therapist Response: reviewed symptoms, discussed stressors, facilitated expression of thoughts and feelings, validated feelings.  Assisted patient identify, challenge/and replace unhelpful thought patterns, assisted patient identify realistic expectations of self, discussed rationale for and assisted patient practice leaves on a stream exercise to cope with ruminating thoughts, developed  plan with patient to practice between sessions   Plan: Return in 1 month  Diagnosis: Axis I: MDD    Anxiety       [name], LCSW 06/01/2021

## 2021-06-01 NOTE — Telephone Encounter (Signed)
Next appt 09-17-2021 SA/MD (annual).

## 2021-06-01 NOTE — Telephone Encounter (Signed)
Pt requesting refill for amphetamine-dextroamphetamine (ADDERALL) 10 MG tablet. Pharmacy [facility] [id number] - [place], [place] - [address]

## 2021-06-11 ENCOUNTER — Ambulatory Visit (INDEPENDENT_AMBULATORY_CARE_PROVIDER_SITE_OTHER): Payer: Managed Care, Other (non HMO) | Admitting: Obstetrics and Gynecology

## 2021-06-11 ENCOUNTER — Other Ambulatory Visit (HOSPITAL_COMMUNITY)
Admission: RE | Admit: 2021-06-11 | Discharge: 2021-06-11 | Disposition: A | Discharge status: Home / Self Care | Payer: Managed Care, Other (non HMO) | Source: Ambulatory Visit | Attending: Obstetrics and Gynecology | Admitting: Obstetrics and Gynecology

## 2021-06-11 ENCOUNTER — Encounter: Payer: Self-pay | Admitting: Obstetrics and Gynecology

## 2021-06-11 ENCOUNTER — Other Ambulatory Visit: Payer: Self-pay

## 2021-06-11 VITALS — BP 102/68 | HR 57 | Ht 64.0 in | Wt 146.0 lb

## 2021-06-11 DIAGNOSIS — Z119 Encounter for screening for infectious and parasitic diseases, unspecified: Secondary | ICD-10-CM | POA: Diagnosis not present

## 2021-06-11 DIAGNOSIS — Z113 Encounter for screening for infections with a predominantly sexual mode of transmission: Secondary | ICD-10-CM | POA: Diagnosis not present

## 2021-06-11 DIAGNOSIS — Z01419 Encounter for gynecological examination (general) (routine) without abnormal findings: Secondary | ICD-10-CM | POA: Diagnosis not present

## 2021-06-11 DIAGNOSIS — Z87410 Personal history of cervical dysplasia: Secondary | ICD-10-CM | POA: Diagnosis not present

## 2021-06-11 MED ORDER — ESTROGENS CONJUGATED 0.625 MG PO TABS: 0.6250 mg | ORAL_TABLET | Freq: Every day | ORAL | 3 refills | Status: DC

## 2021-06-11 NOTE — Patient Instructions (Signed)

## 2021-06-11 NOTE — Progress Notes (Signed)
55 y.o. G0P0000 Divorced [place] American female here for annual exam.    Dx with melasma.   On oral estrogen.  Having night sweats.    Seeing a nutritionist for eating more lean proteins.   Agrees to STD screening.   Has a new partner.  Going back to school to get her undergrad degree in information systems.  Still working in [place] twice a week.   PCP: Adrian Prince MD   Patient's last menstrual period was 06/28/2000.           Sexually active: Yes.    The current method of family planning is status post hysterectomy.    Exercising: Yes.     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 02/2013 Ascus with Neg HR HPV MMG: 07-23-19 Diag.Lt..w/Lt.US--Small mass Lt.Br.unchanged/density B/BiRads3/Diag.Bil.w/Lt.Br.US in 1 yr, reports one is scheduled soon.  Colonoscopy: 05-02-13 normal;next 04/2020--pt. Will call to schedule with Dr. Loreta Ave. BMD:  n/a  Result n/a TDaP:  03/09/13 Gardasil:   no HIV: 05/21/19- NR Hep C: 05/21/19- neg  Screening Labs:  PCP   reports that she has never smoked. She has never used smokeless tobacco. She reports that she does not drink alcohol and does not use drugs.  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   Diabetes mellitus    Dry eyes, bilateral 2018   Fall at home 05/2018   injured low back/spine/sciatic nerve--had 2 spinal injections   Ketoacidosis 2956,2130   Melasma 01/2020   Meningitis 06/2001   --hospital   Rheumatoid arthritis (HCC) 2014   Sleep apnea 2013   Sleep apnea 2011   STD (sexually transmitted disease) 6/09   HSV II   Stress    Thyroid disease    hypothyroidism   VAIN I (vaginal  intraepithelial neoplasia grade I) 2013    Past Surgical History:  Procedure Laterality Date   ABDOMINAL HYSTERECTOMY     2004   CERVICAL BIOPSY  W/ LOOP ELECTRODE EXCISION  01-24-95   CIN I   COLPOSCOPY  11-21-02   CIN III   COLPOSCOPY  05-05-12   no lesions--needs repeat pap 08/2012 per Dr. Tresa Res   HAND SURGERY  2004   tore tendon   positive HPV  03/21/15   normal pap and negative types 16/18   SOFT TISSUE CYST EXCISION  12/2010   left breast epidermoid inclusion cyst   SPINE SURGERY  05/21/14   TONSILLECTOMY AND ADENOIDECTOMY      Current Outpatient Medications  Medication Sig Dispense Refill   ALPRAZolam (XANAX) 0.5 MG tablet Take 0.25 mg by mouth as needed for anxiety.     amphetamine-dextroamphetamine (ADDERALL) 10 MG tablet Take 1 tablet (10 mg total) by mouth 2 (two) times daily. 60 tablet 0   Ascorbic Acid (VITAMIN C) 1000 MG tablet Take 1,000 mg by mouth daily.     Biotin 10 MG CAPS Take 1 capsule by mouth daily.     buPROPion (WELLBUTRIN XL) 300 MG 24 hr tablet Take  1 tablet (300 mg total) by mouth every morning. 30 tablet 2   Continuous Blood Gluc Transmit (DEXCOM G6 TRANSMITTER) MISC CHANGE EVERY 3 MONTHS TO MONITOR BLOOD GLUCOSE CONTINUOUSLY     DULoxetine (CYMBALTA) 60 MG capsule Take one capsule (60mg  total) by mouth daily. 30 capsule 2   estrogens, conjugated, (PREMARIN) 0.625 MG tablet Take 1 tablet (0.625 mg total) by mouth daily. 90 tablet 3   Fluocin-Hydroquinone-Tretinoin (TRI-LUMA) 0.01-4-0.05 % CREA Apply topically daily.     Insulin Human (INSULIN PUMP) SOLN Inject 25 each into the skin as directed. Throughout the day (Per BS)     insulin lispro (HUMALOG) 100 UNIT/ML injection Inject 25 Units into the skin as needed for high blood sugar.      levothyroxine (SYNTHROID, LEVOTHROID) 137 MCG tablet Take 137 mcg by mouth daily.     TRESIBA FLEXTOUCH 100 UNIT/ML FlexTouch Pen Inject into the skin.     Vitamin D, Ergocalciferol, (DRISDOL) 1.25 MG (50000 UNIT) CAPS  capsule Take 50,000 Units by mouth once a week.     Vitamin E 400 units TABS Take 1 capsule by mouth daily.     XIIDRA 5 % SOLN Apply to eye 2 (two) times daily.     No current facility-administered medications for this visit.    Family History  Problem Relation Age of Onset   Other Mother        cysts   Diabetes Father    Kidney disease Father    Stroke Paternal Aunt    Breast cancer Neg Hx     Review of Systems  All other systems reviewed and are negative.  Exam:   BP 102/68    Pulse (!) 57    Ht 5\' 4"  (1.626 m)    Wt 146 lb (66.2 kg)    LMP 06/28/2000    SpO2 99%    BMI 25.06 kg/m     General appearance: alert, cooperative and appears stated age Head: normocephalic, without obvious abnormality, atraumatic Neck: no adenopathy, supple, symmetrical, trachea midline and thyroid normal to inspection and palpation Lungs: clear to auscultation bilaterally Breasts: normal appearance, no masses or tenderness, No nipple retraction or dimpling, No nipple discharge or bleeding, No axillary adenopathy Heart: regular rate and rhythm Abdomen: soft, non-tender; no masses, no organomegaly Extremities: extremities normal, atraumatic, no cyanosis or edema Skin: skin color, texture, turgor normal. No rashes or lesions Lymph nodes: cervical, supraclavicular, and axillary nodes normal. Neurologic: grossly normal  Pelvic: External genitalia:  no lesions              No abnormal inguinal nodes palpated.              Urethra:  normal appearing urethra with no masses, tenderness or lesions              Bartholins and Skenes: normal                 Vagina: normal appearing vagina with normal color and discharge, no lesions              Cervix: no              Pap taken: yes Bimanual Exam:  Uterus:  absent              Adnexa: no mass, fullness, tenderness              Rectal exam: yes..  Confirms.  Anus:  normal sphincter tone, no lesions  Chaperone was present for exam:   yes.  Assessment:   Well woman visit with gynecologic exam. Status post TVH.   History of CIN III and VAIN I.   Pap 2016 normal and positive HR HPV, negative 16/18. ERT patient.    HSV.  Not taking antiviral medication. DM. Varicose veins.  Hyperpigmentation of the vulva.  Melanocytic macule, lentigo.  Melasma.  STD screening/infectious diease screening.   Plan: Mammogram screening discussed. Self breast awareness reviewed. Pap and HR HPV as above. Guidelines for Calcium, Vitamin D, regular exercise program including cardiovascular and weight bearing exercise. Continue Premarin orally 0.625 mg.  We reviewed the effect of estrogens on melasma.  Consider switch from Cymbalta to Effexor.  She will discuss with her prescriber. We reviewed benefits of taking Valtrex to reduce shedding of HSV particles. Follow up annually and prn.   After visit summary provided.

## 2021-06-12 LAB — HEPATITIS C ANTIBODY
Hepatitis C Ab: NONREACTIVE
SIGNAL TO CUT-OFF: 0.04 (ref <1.00)

## 2021-06-12 LAB — RPR: RPR Ser Ql: NONREACTIVE

## 2021-06-12 LAB — HIV ANTIBODY (ROUTINE TESTING W REFLEX): HIV 1&2 Ab, 4th Generation: NONREACTIVE

## 2021-06-15 LAB — CYTOLOGY - PAP
Chlamydia: NEGATIVE
Comment: NEGATIVE
Comment: NEGATIVE
Comment: NEGATIVE
Comment: NORMAL
Diagnosis: HIGH — AB
High risk HPV: NEGATIVE
Neisseria Gonorrhea: NEGATIVE
Trichomonas: NEGATIVE

## 2021-06-18 ENCOUNTER — Other Ambulatory Visit: Payer: Self-pay | Admitting: *Deleted

## 2021-06-18 DIAGNOSIS — R87613 High grade squamous intraepithelial lesion on cytologic smear of cervix (HGSIL): Secondary | ICD-10-CM

## 2021-07-01 ENCOUNTER — Encounter (HOSPITAL_COMMUNITY): Payer: Self-pay | Admitting: Psychiatry

## 2021-07-01 ENCOUNTER — Telehealth (HOSPITAL_BASED_OUTPATIENT_CLINIC_OR_DEPARTMENT_OTHER): Payer: 59 | Admitting: Psychiatry

## 2021-07-01 ENCOUNTER — Other Ambulatory Visit: Payer: Self-pay

## 2021-07-01 VITALS — Wt 146.0 lb

## 2021-07-01 DIAGNOSIS — F331 Major depressive disorder, recurrent, moderate: Secondary | ICD-10-CM | POA: Diagnosis not present

## 2021-07-01 DIAGNOSIS — F419 Anxiety disorder, unspecified: Secondary | ICD-10-CM | POA: Diagnosis not present

## 2021-07-01 MED ORDER — BUPROPION HCL ER (XL) 300 MG PO TB24: 300.0000 mg | ORAL_TABLET | ORAL | 0 refills | Status: DC

## 2021-07-01 MED ORDER — DULOXETINE HCL 30 MG PO CPEP: ORAL_CAPSULE | ORAL | 0 refills | Status: DC

## 2021-07-01 MED ORDER — GABAPENTIN 100 MG PO CAPS: 100.0000 mg | ORAL_CAPSULE | Freq: Two times a day (BID) | ORAL | 0 refills | Status: DC | PRN

## 2021-07-01 MED ORDER — VENLAFAXINE HCL ER 37.5 MG PO CP24: ORAL_CAPSULE | ORAL | 0 refills | Status: DC

## 2021-07-01 NOTE — Progress Notes (Signed)
Virtual Visit via Telephone Note  I connected with [name] [name] on 07/01/21 at  4:00 PM EST by telephone and verified that I am speaking with the correct person using two identifiers.  Location: Patient: Home Provider: Home Office   I discussed the limitations, risks, security and privacy concerns of performing an evaluation and management service by telephone and the availability of in person appointments. I also discussed with the patient that there may be a patient responsible charge related to this service. The patient expressed understanding and agreed to proceed.   History of Present Illness: Patient is evaluated by phone session.  She is taking all her medication.  She continues to take Xanax 0.25 mg but like to come off and on to try a different medication.  Patient prefer to take something we does not have it from.  She also had a visit with her OB/GYN and she has melasma and she was recommended to try Effexor and to come off from Cymbalta.  Overall she feels things are going well.  She excited as she is going to start school and then she will start to look for a new job.  She had a time off for Christmas and she rested very well.  She has a good time with the family.  She is in therapy with Florencia Reasons.  She denies any paranoia, hallucination, suicidal thoughts.  Her energy level is good.  She has no tremor or shakes or any EPS.  She sleeps good but does require Xanax frequently and hoping something that she can try.    Past psychiatric history; No h/o inpatient treatment or suicidal attempt.  No h/o psychosis, mania, or abuse.  Took Wellbutrin prescribed by PCP.  Dose increased to 450 few years ago.  We tried Lamictal but caused rash.  Neurologist had prescribed Adderall to help her memory.  Psychiatric Specialty Exam: Physical Exam  Review of Systems  Weight 146 lb (66.2 kg), last menstrual period 06/28/2000.There is no height or weight on file to calculate BMI.  General  Appearance: NA  Eye Contact:  NA  Speech:  Clear and Coherent and Normal Rate  Volume:  Normal  Mood:  Euthymic  Affect:  NA  Thought Process:  Goal Directed  Orientation:  Full (Time, Place, and Person)  Thought Content:  Logical  Suicidal Thoughts:  No  Homicidal Thoughts:  No  Memory:  Immediate;   Good Recent;   Good Remote;   Good  Judgement:  Intact  Insight:  Present  Psychomotor Activity:  NA  Concentration:  Concentration: Good and Attention Span: Good  Recall:  Good  Fund of Knowledge:  Good  Language:  Good  Akathisia:  No  Handed:  Right  AIMS (if indicated):     Assets:  Communication Skills Desire for Improvement Housing Resilience Transportation  ADL's:  Intact  Cognition:  WNL  Sleep:   ok      Assessment and Plan: Major depressive disorder, recurrent.  Anxiety.  I reviewed blood work results.  I reviewed notes from OB/GYN.  She like to try Effexor and want to come off from Cymbalta it is recommended by her OB/GYN.  Patient has melasma and recommendation was given by her OB/GYN.  She also like to try a different medicine so that she can come off from Xanax which is prescribed by her PCP.  I discussed trying gabapentin to help with anxiety and sleep.  She is willing to try.  I recommend to try  gabapentin 100 mg up to 2 times a day to help with anxiety and sleep and racing thoughts.  We will also start Effexor 37.5 mg for 1 week and then 75 mg daily.  We will cut down the duloxetine from 60 mg to take 30 mg for 1 week and then she will continue 30 mg every other week and then stop.  Continue Wellbutrin XL 300 mg daily.  Encouraged to continue therapy with Maurice Small.  Recommended to call us back if she is any question or any concern.  Follow-up in 4 weeks.  Follow Up Instructions:    I discussed the assessment and treatment plan with the patient. The patient was provided an opportunity to ask questions and all were answered. The patient agreed with the plan  and demonstrated an understanding of the instructions.   The patient was advised to call back or seek an in-person evaluation if the symptoms worsen or if the condition fails to improve as anticipated.  I provided 23 minutes of non-face-to-face time during this encounter.   Kathlee Nations, MD

## 2021-07-03 NOTE — Progress Notes (Signed)
°  Subjective:     Patient ID: [name] [name], female   DOB: [birth year], 56 y.o.   MRN: [id number]  HPI Patient here today for colposcopy with pap 06-11-21 HSIL:Neg HR HPV.  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 02/2013 Ascus with Neg HR HPV  Patient states she is nervous today and almost wishes she took medication for anxiety prior to the procedure. She is diabetic.  She took 2 glucose pills prior to the visit.  Glucose was 108.   Review of Systems  All other systems reviewed and are negative. LMP: Hyst     Objective:   Physical Exam Colposcopy of the vagina.  Consent for procedure.  Acetic acid placed in the vagina.  Cervix is absent.  No lesions noted.  Lugol's placed.  Very subtle decreased uptake at the vaginal apex.  Biopsy with Tichler performed and sent to pathology.  Patient became uncomfortable and requested to temporarily stop the procedure, which was done.   The speculum was removed. She felt the urge to use the bathroom and then started hyperventilating. Her legs were taken out of the stirrups and placed flat on a rest.  She started sweating and became pale.  She remained conscious.  Additional nursing assistance requested.   BP 114/60, pulse 64, OS sat 96%, blood sugar 108.  Lungs CTA bilaterally.  Cor S1S2 RRR.    Cold cloth applied to head.   Her color improved and she became more interactive.     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  Negative HR HPV.  Hx prior CIN III preceding hysterectomy.  Vasovagal response.     Plan:     FU biopsy results.  Final plan to follow.  Patient's friend, Truddie Crumble, also on her DPI form, was called with the patient's permission, to come and pick up the patient and take her home after she was noted to be stable. Patient accompanied by CMA  in a wheel chair down to the friend's car.

## 2021-07-06 ENCOUNTER — Ambulatory Visit (INDEPENDENT_AMBULATORY_CARE_PROVIDER_SITE_OTHER): Payer: 59 | Admitting: Psychiatry

## 2021-07-06 ENCOUNTER — Other Ambulatory Visit: Payer: Self-pay

## 2021-07-06 DIAGNOSIS — F331 Major depressive disorder, recurrent, moderate: Secondary | ICD-10-CM

## 2021-07-06 NOTE — Progress Notes (Signed)
Virtual Visit via Video Note  I connected with [name] [name] on 07/06/21 at 3:17 PM EST by a video enabled telemedicine application and verified that I am speaking with the correct person using two identifiers.  Location: Patient: Home Provider: [facility] [place] office    I discussed the limitations of evaluation and management by telemedicine and the availability of in person appointments. The patient expressed understanding and agreed to proceed.   I provided 43 minutes of non-face-to-face time during this encounter.   [name], LCSW     Session Time:  Monday 07/06/2021 3:17 PM - 4:00 PM   Participation Level: Active  Behavioral Response: CasualAlert/pleasant,   Type of Therapy: Individual Therapy  Treatment Goals addressed:  Patient would like to decrease anxiety related to work and medical stressors to function at her best and advocate for self more assertiveness  Interventions: CBT and Supportive  Summary: [name] is a 56 y.o. female who is referred for sevices for continuity of care by therapist Hilbert Odor while therapist is on temporary leave. She denies any psychiatric hospitalizations. She has been in outpatient therapy for a year and currently sees psychiatrist [name] for medication management.  She reports needing help with stress management mainly with work issues.  Patient reports being under a lot of stress due to work demands and says it has implications for her diabetes management.  She reports tendency to internalize thoughts and feelings. She reports frequent worry about not getting information timely from coworkers to perform her job.  Current symptoms include difficulty concentrating, fatigue, thoughts/feelings of hopelessness, irritability, sleep difficulty, tearfulness, fatigue, and excessive worry.      Patient last was seen via virtual visit about 2- 3 weeks ago.  Patient reports enjoying the holidays spending time with family and  friends.  She reports improved mood and increased behavioral activation.  Patient also reports taking time off work which was very helpful.  She reports starting to experience some anxiety when returning to work for her first full work week of the new year.  She continues to express frustration regarding on realistic work demands and lack of support/promotion on her job.  She reports using thought log to try to process her feelings and reframe negative thoughts.  She also expresses frustration regarding her current manager calling her by an inappropriate name.  She is pleased that she was assertive and set maintained limits with one of her coworkers.  Patient is excited about starting online classes at [employer] today Suicidal/Homicidal: Nowithout intent/plan  Therapist Response: reviewed symptoms, praised and reinforced patient's increased involvement in activities and socialization/use of assertiveness skills, discussed effects, discussed stressors, facilitated expression of thoughts and feelings, validated feelings, assisted patient processed thought log/challenge/replace unhelpful thought patterns, assisted patient identify ways to improve assertiveness skills with manager by using I statements   Plan: Return in 1 month  Diagnosis: Axis I: MDD    Anxiety       [name], LCSW 07/06/2021

## 2021-07-07 ENCOUNTER — Other Ambulatory Visit (HOSPITAL_COMMUNITY): Payer: Self-pay | Admitting: Psychiatry

## 2021-07-07 DIAGNOSIS — F331 Major depressive disorder, recurrent, moderate: Secondary | ICD-10-CM

## 2021-07-07 DIAGNOSIS — F419 Anxiety disorder, unspecified: Secondary | ICD-10-CM

## 2021-07-09 ENCOUNTER — Other Ambulatory Visit: Payer: Self-pay | Admitting: Neurology

## 2021-07-09 DIAGNOSIS — G4733 Obstructive sleep apnea (adult) (pediatric): Secondary | ICD-10-CM

## 2021-07-09 DIAGNOSIS — G4711 Idiopathic hypersomnia with long sleep time: Secondary | ICD-10-CM

## 2021-07-09 MED ORDER — AMPHETAMINE-DEXTROAMPHETAMINE 10 MG PO TABS: 10.0000 mg | ORAL_TABLET | Freq: Two times a day (BID) | ORAL | 0 refills | Status: DC

## 2021-07-09 NOTE — Telephone Encounter (Signed)
Pt is requesting a refill for amphetamine-dextroamphetamine (ADDERALL) 10 MG tablet.  Pharmacy: [facility] #[id number]  Pt confirmed no changes to insurance with the New Year

## 2021-07-09 NOTE — Telephone Encounter (Signed)
Per [place] registry, last filled a 30 day supply #60 on 06/01/2021. Pending f/u on 09/17/21. Rx refill sent to Dr Frances Furbish to e-scribe.

## 2021-07-10 ENCOUNTER — Other Ambulatory Visit: Payer: Self-pay

## 2021-07-10 ENCOUNTER — Ambulatory Visit: Payer: Managed Care, Other (non HMO) | Admitting: Obstetrics and Gynecology

## 2021-07-10 ENCOUNTER — Encounter: Payer: Self-pay | Admitting: Obstetrics and Gynecology

## 2021-07-10 DIAGNOSIS — R87623 High grade squamous intraepithelial lesion on cytologic smear of vagina (HGSIL): Secondary | ICD-10-CM | POA: Diagnosis not present

## 2021-07-10 DIAGNOSIS — R87613 High grade squamous intraepithelial lesion on cytologic smear of cervix (HGSIL): Secondary | ICD-10-CM

## 2021-07-10 NOTE — Patient Instructions (Signed)
Colposcopy, Care After The following information offers guidance on how to care for yourself after your procedure. Your doctor may also give you more specific instructions. If you have problems or questions, contact your doctor. What can I expect after the procedure? If you did not have a sample of your tissue taken out (did not have a biopsy), you may only have some spotting of blood for a few days. You can go back to your normal activities. If you had a sample of your tissue taken out, it is common to have: Soreness and mild pain. These may last for a few days. Mild bleeding or fluid (discharge) coming from your vagina. The fluid will look dark and grainy. You may have this for a few days. The fluid may be caused by a liquid that was used during your procedure. You may need to wear a sanitary pad. Spotting of blood for at least 48 hours after the procedure. Follow these instructions at home: Medicines Take over-the-counter and prescription medicines only as told by your doctor. Ask your doctor what over-the-counter pain medicines and prescription medicines you can start taking again. This is very important if you take blood thinners. Activity For at least 3 days, or for as long as told by your doctor, avoid: Douching. Using tampons. Having sex. Return to your normal activities as told by your doctor. Ask your doctor what activities are safe for you. General instructions Ask your doctor if you may take baths, swim, or use a hot tub. You may take showers. If you use birth control (contraception), keep using it. Keep all follow-up visits. Contact a doctor if: You have a fever or chills. You faint or feel light-headed. Get help right away if: You bleed a lot from your vagina. A lot of bleeding means that the bleeding soaks through a pad in less than 1 hour. You have clumps of blood (blood clots) coming from your vagina. You have signs that could mean you have an infection. This may be fluid  coming from your vagina that is: Different than normal. Yellow. Bad-smelling. You have very bad pain or cramps in your lower belly that do not get better with medicine. Summary If you did not have a sample of your tissue taken out, you may only have some spotting of blood for a few days. You can go back to your normal activities. If you had a sample of your tissue taken out, it is common to have mild pain for a few days and spotting for 48 hours. Avoid douching, using tampons, and having sex for at least 3 days after the procedure or for as long as told. Get help right away if you have a lot of bleeding, very bad pain, or signs of infection. This information is not intended to replace advice given to you by your health care provider. Make sure you discuss any questions you have with your health care provider. Document Revised: 11/09/2020 Document Reviewed: 11/09/2020 Elsevier Patient Education  2022 Elsevier Inc.  

## 2021-07-13 ENCOUNTER — Other Ambulatory Visit (HOSPITAL_COMMUNITY)
Admission: RE | Admit: 2021-07-13 | Discharge: 2021-07-13 | Disposition: A | Discharge status: Home / Self Care | Payer: Managed Care, Other (non HMO) | Source: Ambulatory Visit | Attending: Obstetrics and Gynecology | Admitting: Obstetrics and Gynecology

## 2021-07-13 DIAGNOSIS — R87613 High grade squamous intraepithelial lesion on cytologic smear of cervix (HGSIL): Secondary | ICD-10-CM | POA: Diagnosis present

## 2021-07-15 LAB — SURGICAL PATHOLOGY

## 2021-07-16 ENCOUNTER — Telehealth: Payer: Self-pay

## 2021-07-20 ENCOUNTER — Other Ambulatory Visit: Payer: Self-pay

## 2021-07-20 ENCOUNTER — Encounter (HOSPITAL_COMMUNITY): Payer: Self-pay

## 2021-07-20 ENCOUNTER — Ambulatory Visit (INDEPENDENT_AMBULATORY_CARE_PROVIDER_SITE_OTHER): Payer: 59 | Admitting: Psychiatry

## 2021-07-20 DIAGNOSIS — F331 Major depressive disorder, recurrent, moderate: Secondary | ICD-10-CM

## 2021-07-20 NOTE — Telephone Encounter (Signed)
Encounter not needed
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
## 2021-07-24 ENCOUNTER — Encounter (HOSPITAL_COMMUNITY): Payer: Self-pay

## 2021-07-25 ENCOUNTER — Other Ambulatory Visit (HOSPITAL_COMMUNITY): Payer: Self-pay | Admitting: Psychiatry

## 2021-07-25 DIAGNOSIS — F419 Anxiety disorder, unspecified: Secondary | ICD-10-CM

## 2021-07-25 DIAGNOSIS — F331 Major depressive disorder, recurrent, moderate: Secondary | ICD-10-CM

## 2021-07-29 ENCOUNTER — Other Ambulatory Visit: Payer: Self-pay

## 2021-07-29 ENCOUNTER — Encounter (HOSPITAL_COMMUNITY): Payer: Self-pay | Admitting: Psychiatry

## 2021-07-29 ENCOUNTER — Telehealth (HOSPITAL_BASED_OUTPATIENT_CLINIC_OR_DEPARTMENT_OTHER): Payer: 59 | Admitting: Psychiatry

## 2021-07-29 DIAGNOSIS — G4733 Obstructive sleep apnea (adult) (pediatric): Secondary | ICD-10-CM | POA: Diagnosis not present

## 2021-07-29 DIAGNOSIS — F419 Anxiety disorder, unspecified: Secondary | ICD-10-CM

## 2021-07-29 DIAGNOSIS — G4711 Idiopathic hypersomnia with long sleep time: Secondary | ICD-10-CM | POA: Diagnosis not present

## 2021-07-29 DIAGNOSIS — F331 Major depressive disorder, recurrent, moderate: Secondary | ICD-10-CM | POA: Diagnosis not present

## 2021-07-29 DIAGNOSIS — Z9989 Dependence on other enabling machines and devices: Secondary | ICD-10-CM

## 2021-07-29 MED ORDER — VENLAFAXINE HCL ER 37.5 MG PO CP24: ORAL_CAPSULE | ORAL | 0 refills | Status: DC

## 2021-07-29 MED ORDER — BUPROPION HCL ER (XL) 300 MG PO TB24: 300.0000 mg | ORAL_TABLET | ORAL | 0 refills | Status: DC

## 2021-07-29 NOTE — Progress Notes (Signed)
Virtual Visit via Telephone Note  I connected with [name] [name] on 07/29/21 at  4:00 PM EST by telephone and verified that I am speaking with the correct person using two identifiers.  Location: Patient: Work Provider: Biomedical scientist   I discussed the limitations, risks, security and privacy concerns of performing an evaluation and management service by telephone and the availability of in person appointments. I also discussed with the patient that there may be a patient responsible charge related to this service. The patient expressed understanding and agreed to proceed.   History of Present Illness: Patient is evaluated by phone session.  On the last visit we started her on Effexor as her OB/GYN recommended to try and come off from Cymbalta.  She is taking 75 mg Effexor however has not noticed a huge difference.  She still struggles with anxiety, poor sleep but when she tried gabapentin it make her very groggy and tired the next day.  She is compliant with Wellbutrin.  She still wants to come off from Xanax which she takes 0.25 mg in the morning.  She admitted feeling very nervous and anxious during the day.  Last week was difficult as a school started.  Patient also had difficulty expressing her symptoms as sometimes she struggled to make a decision.  She has not started looking for a new job.  Her appetite is okay.  Her weight is stable.  Her last hemoglobin A1c was 6.2.  Since she had a insulin pump her blood sugar is a stable.  She sees Dr. Forde Dandy who manages her diabetes.    Past psychiatric history; No h/o inpatient treatment or suicidal attempt.  No h/o psychosis, mania, or abuse.  Took Wellbutrin prescribed by PCP.  Dose increased to 450 few years ago.  We tried Lamictal but caused rash.  Neurologist had prescribed Adderall to help her memory.    Psychiatric Specialty Exam: Physical Exam  Review of Systems  Weight 146 lb (66.2 kg), last menstrual period 06/28/2000.There is no height or  weight on file to calculate BMI.  General Appearance: NA  Eye Contact:  NA  Speech:  Slow  Volume:  Decreased  Mood:  Anxious  Affect:  NA  Thought Process:  Goal Directed  Orientation:  Full (Time, Place, and Person)  Thought Content:  Rumination  Suicidal Thoughts:  No  Homicidal Thoughts:  No  Memory:  Immediate;   Good Recent;   Good Remote;   Fair  Judgement:  Intact  Insight:  Fair  Psychomotor Activity:  NA  Concentration:  Concentration: Fair and Attention Span: Fair  Recall:  Good  Fund of Knowledge:  Good  Language:  Good  Akathisia:  No  Handed:  Right  AIMS (if indicated):     Assets:  Communication Skills Desire for Improvement Housing Talents/Skills Transportation  ADL's:  Intact  Cognition:  WNL  Sleep:   fair      Assessment and Plan: Major depressive disorder, recurrent.  Anxiety.  Recommend to discontinue gabapentin as patient could not tolerate.  I reviewed her medication.  She is taking Adderall 20 mg in the morning prescribed by neurologist for hypersomnia.  She feels very anxious during the day and I recommend trying to cut down the stimulant as it may be contributing to her anxiety and maybe lowering the dose help her sleep better and feel less anxiety during the day.  Patient willing to try.  I recommend to try Effexor 112.5 mg as dose adjustment may  help her anxiety.  She is not taking Cymbalta.  Continue Wellbutrin XL 300 mg daily.  Encouraged to continue therapy with Maurice Small.  Recommended to call us back if is any question or any concern.  Follow-up in 4 weeks.     Follow Up Instructions:    I discussed the assessment and treatment plan with the patient. The patient was provided an opportunity to ask questions and all were answered. The patient agreed with the plan and demonstrated an understanding of the instructions.   The patient was advised to call back or seek an in-person evaluation if the symptoms worsen or if the condition fails to  improve as anticipated.  I provided 30 minutes of non-face-to-face time during this encounter.   Kathlee Nations, MD

## 2021-08-03 ENCOUNTER — Other Ambulatory Visit: Payer: Self-pay

## 2021-08-03 ENCOUNTER — Ambulatory Visit (HOSPITAL_COMMUNITY): Payer: 59 | Admitting: Psychiatry

## 2021-08-17 ENCOUNTER — Other Ambulatory Visit: Payer: Self-pay

## 2021-08-17 ENCOUNTER — Ambulatory Visit (INDEPENDENT_AMBULATORY_CARE_PROVIDER_SITE_OTHER): Payer: 59 | Admitting: Psychiatry

## 2021-08-17 DIAGNOSIS — F331 Major depressive disorder, recurrent, moderate: Secondary | ICD-10-CM

## 2021-08-17 NOTE — Progress Notes (Signed)
Virtual Visit via Video Note  I connected with [name] [name] on 08/17/21 at 3:15 PM EST  by a video enabled telemedicine application and verified that I am speaking with the correct person using two identifiers.  Location: Patient: Home Provider: [facility] [place] office    I discussed the limitations of evaluation and management by telemedicine and the availability of in person appointments. The patient expressed understanding and agreed to proceed.   I provided 45 minutes of non-face-to-face time during this encounter.   [name], LCSW     Session Time:  Monday 08/17/2021 3:15 PM - 4:00 PM 4  Participation Level: Active  Behavioral Response: CasualAlert/depressed, tearful  Type of Therapy: Individual Therapy  Treatment Goals addressed:  Patient would like to decrease anxiety related to work and medical stressors to function at her best and advocate for self more assertiveness  Interventions: CBT and Supportive  Summary: [name] [name] is a 56 y.o. female who is referred for sevices for continuity of care by therapist Hilbert Odor while therapist is on temporary leave. She denies any psychiatric hospitalizations. She has been in outpatient therapy for a year and currently sees psychiatrist [name] for medication management.  She reports needing help with stress management mainly with work issues.  Patient reports being under a lot of stress due to work demands and says it has implications for her diabetes management.  She reports tendency to internalize thoughts and feelings. She reports frequent worry about not getting information timely from coworkers to perform her job.  Current symptoms include difficulty concentrating, fatigue, thoughts/feelings of hopelessness, irritability, sleep difficulty, tearfulness, fatigue, and excessive worry.      Patient last was seen via virtual visit about 2- 3 weeks ago.  Patient reports increased depressed mood, worry, and  tearfulness since last session.  Trigger is increased work stress particularly patient's interaction with another coworker who continuously ask patient redundant questions during meetings per her report.  Patient reports this has triggered thoughts of being inadequate, being incompetent, and not being good enough.  She states trying to make self numb.  She reports she has discontinued taking college course due to to the stress of work as well as the long drive to and from work.  However, she plans to resume course in the near future and states being determined to do this.  She also continues to plan to look for another job.  Patient reports trying to avoid isolating she reports recently going out to dinner with a friend.  Suicidal/Homicidal: Nowithout intent/plan  Therapist Response: reviewed symptoms, discussed stressors, facilitated expression of thoughts and feelings, validated feelings, reviewed lapse versus relapse, assisted patient identify/challenge for work/and replace negative core beliefs with more rational beliefs, reviewed the role of behavioral activation and socialization and coping with depression developed plan with patient to maintain consistent behavioral activation/socialization/positive self-care, also developed plan with patient to review mental health maintenance plan.  Plan: Return in 1 month  Diagnosis: Axis I: MDD    Anxiety       [name], LCSW 08/17/2021

## 2021-08-25 ENCOUNTER — Other Ambulatory Visit (HOSPITAL_COMMUNITY): Payer: Self-pay | Admitting: Psychiatry

## 2021-08-25 DIAGNOSIS — F419 Anxiety disorder, unspecified: Secondary | ICD-10-CM

## 2021-08-25 DIAGNOSIS — F331 Major depressive disorder, recurrent, moderate: Secondary | ICD-10-CM

## 2021-08-29 ENCOUNTER — Other Ambulatory Visit (HOSPITAL_COMMUNITY): Payer: Self-pay | Admitting: Psychiatry

## 2021-08-29 DIAGNOSIS — F331 Major depressive disorder, recurrent, moderate: Secondary | ICD-10-CM

## 2021-08-31 ENCOUNTER — Other Ambulatory Visit: Payer: Self-pay

## 2021-08-31 ENCOUNTER — Ambulatory Visit (INDEPENDENT_AMBULATORY_CARE_PROVIDER_SITE_OTHER): Payer: 59 | Admitting: Psychiatry

## 2021-08-31 ENCOUNTER — Telehealth (HOSPITAL_COMMUNITY): Payer: 59 | Admitting: Psychiatry

## 2021-08-31 DIAGNOSIS — F331 Major depressive disorder, recurrent, moderate: Secondary | ICD-10-CM

## 2021-08-31 NOTE — Progress Notes (Signed)
Virtual Visit via Video Note ? ?I connected with [name] [name] on 08/31/21 at 3:16 PM EST by a video enabled telemedicine application and verified that I am speaking with the correct person using two identifiers. ? ?Location: ?Patient: Home ?Provider: [facility] [place] office  ?  ?I discussed the limitations of evaluation and management by telemedicine and the availability of in person appointments. The patient expressed understanding and agreed to proceed. ? ? ?I provided 44 minutes of non-face-to-face time during this encounter. ? ? ?Kathe Wirick E Celia Gibbons, LCSW ? ? ? ? ? ?Session Time:  Monday 08/31/2021 3:16 PM - 4:00 PM ? ?Participation Level: Active ? ?Behavioral Response: CasualAlert/depressed,  ? ?Type of Therapy: Individual Therapy ? ?Treatment Goals addressed:  Patient would like to decrease anxiety related to work and medical stressors to function at her best and advocate for self more assertiveness ? ?Interventions: CBT and Supportive ? ?Summary: [name] [name] is a 56 y.o. female who is referred for sevices for continuity of care by therapist Hilbert Odor while therapist is on temporary leave. She denies any psychiatric hospitalizations. She has been in outpatient therapy for a year and currently sees psychiatrist [name] for medication management.  She reports needing help with stress management mainly with work issues.  Patient reports being under a lot of stress due to work demands and says it has implications for her diabetes management.  She reports tendency to internalize thoughts and feelings. She reports frequent worry about not getting information timely from coworkers to perform her job.  Current symptoms include difficulty concentrating, fatigue, thoughts/feelings of hopelessness, irritability, sleep difficulty, tearfulness, fatigue, and excessive worry.    ?  ?Patient last was seen via virtual visit about 2- 3 weeks ago.  Patient continues to report symptoms of depression but less  intensity, frequency, and duration.  She has been using behavioral activation as well as replacing negative thoughts to try to cope.  She also has tried to increase socialization.  She continues to experience frustration regarding the way she is treated at work and reports this triggers negative core beliefs.  However, she has been able to challenge these beliefs. ? ?Suicidal/Homicidal: Nowithout intent/plan ? ?Therapist Response: reviewed symptoms, praised and reinforced patient's efforts to use helpful coping strategies, discussed effects discussed stressors, facilitated expression of thoughts and feelings, validated feelings, discussed rationale for and assisted patient practice mindfulness activity (leaves on a stream) to promote cognitive defusion and acceptance, debriefed exercise with patient, developed plan with patient to practice activity once per day 4 to 5 days/week, checked out audio mindfulness activity to patient and provided her with code  ?Plan: Return in 2 weeks ? ?Diagnosis: Axis I: MDD ?   Anxiety ? ?  ? ? ? ?[name], LCSW ?08/31/2021  ? ? ? ?  ?

## 2021-09-03 ENCOUNTER — Other Ambulatory Visit (HOSPITAL_COMMUNITY): Payer: Self-pay | Admitting: *Deleted

## 2021-09-03 DIAGNOSIS — F331 Major depressive disorder, recurrent, moderate: Secondary | ICD-10-CM

## 2021-09-03 MED ORDER — BUPROPION HCL ER (XL) 300 MG PO TB24: 300.0000 mg | ORAL_TABLET | ORAL | 0 refills | Status: DC

## 2021-09-08 ENCOUNTER — Telehealth (HOSPITAL_COMMUNITY): Payer: 59 | Admitting: Psychiatry

## 2021-09-09 ENCOUNTER — Other Ambulatory Visit: Payer: Self-pay | Admitting: Neurology

## 2021-09-09 DIAGNOSIS — G4711 Idiopathic hypersomnia with long sleep time: Secondary | ICD-10-CM

## 2021-09-09 DIAGNOSIS — G4733 Obstructive sleep apnea (adult) (pediatric): Secondary | ICD-10-CM

## 2021-09-09 MED ORDER — AMPHETAMINE-DEXTROAMPHETAMINE 10 MG PO TABS: 10.0000 mg | ORAL_TABLET | Freq: Two times a day (BID) | ORAL | 0 refills | Status: DC

## 2021-09-09 NOTE — Telephone Encounter (Signed)
Last visit: 09/16/20 ?Next visit: 09/17/21 ? ?Checked [place] registry, last filled on 07/09/2021 #60/30. Dr Frances Furbish out of office. Refill request sent to Dr Epimenio Foot, work-in MD.  ?

## 2021-09-09 NOTE — Telephone Encounter (Signed)
Pt is requesting a refill for amphetamine-dextroamphetamine (ADDERALL) 10 MG tablet .  Pharmacy: [id number]  

## 2021-09-11 ENCOUNTER — Encounter (HOSPITAL_COMMUNITY): Payer: Self-pay | Admitting: Psychiatry

## 2021-09-11 ENCOUNTER — Telehealth (HOSPITAL_COMMUNITY): Payer: 59 | Admitting: Psychiatry

## 2021-09-11 ENCOUNTER — Other Ambulatory Visit (HOSPITAL_COMMUNITY): Payer: Self-pay | Admitting: Psychiatry

## 2021-09-11 ENCOUNTER — Telehealth (HOSPITAL_BASED_OUTPATIENT_CLINIC_OR_DEPARTMENT_OTHER): Payer: 59 | Admitting: Psychiatry

## 2021-09-11 ENCOUNTER — Other Ambulatory Visit: Payer: Self-pay

## 2021-09-11 VITALS — Wt 141.0 lb

## 2021-09-11 DIAGNOSIS — F419 Anxiety disorder, unspecified: Secondary | ICD-10-CM

## 2021-09-11 DIAGNOSIS — F331 Major depressive disorder, recurrent, moderate: Secondary | ICD-10-CM

## 2021-09-11 MED ORDER — VENLAFAXINE HCL ER 37.5 MG PO CP24: ORAL_CAPSULE | ORAL | 1 refills | Status: DC

## 2021-09-11 MED ORDER — BUPROPION HCL ER (XL) 300 MG PO TB24: 300.0000 mg | ORAL_TABLET | ORAL | 1 refills | Status: DC

## 2021-09-11 MED ORDER — ALPRAZOLAM 0.25 MG PO TABS: 0.5000 mg | ORAL_TABLET | Freq: Two times a day (BID) | ORAL | 0 refills | Status: DC | PRN

## 2021-09-11 NOTE — Progress Notes (Signed)
Virtual Visit via Telephone Note ? ?I connected with [name] [name] on 09/11/21 at 11:20 AM EDT by telephone and verified that I am speaking with the correct person using two identifiers. ? ?Location: ?Patient: Home ?Provider: Home Office ?  ?I discussed the limitations, risks, security and privacy concerns of performing an evaluation and management service by telephone and the availability of in person appointments. I also discussed with the patient that there may be a patient responsible charge related to this service. The patient expressed understanding and agreed to proceed. ? ? ?History of Present Illness: ?Patient is evaluated by phone session.  She is sleeping better since she cut down her Adderall and only taking 1 pill a day.  We have increased Effexor dose on the last visit and she noticed that helps her depression.  She is still anxious and nervous about her job.  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  She is working hybrid.  She had to drop out her school because she could not able to do both.  She is in therapy with Florencia Reasons.  She continues to take 0.25 mg Xanax as needed which helps but she like to come off from it in the future.  She is now actively looking for a new job preferably working virtually.  She is willing to take a pay cut because she like to focus on her health.  Her PCP Dr. Evlyn Kanner manages her diabetes.  She denies any anger, mania, crying spells or any feeling of hopelessness or worthlessness.  She uses mouthguard for jaw grinding.  We have tried gabapentin but it make her very groggy and sedated.   ? ?Past psychiatric history; ?No h/o inpatient treatment or suicidal attempt.  No h/o psychosis, mania, or abuse.  Took Wellbutrin prescribed by PCP.  Dose increased to 450 few years ago.  We tried Lamictal but caused rash.  Neurologist had prescribed Adderall to help her memory.  Her appetite is okay.  Since she cut down  her Adderall she noticed some time feeling tired but she is sleeping better. ?  ?Psychiatric Specialty Exam: ?Physical Exam  ?Review of Systems  ?Weight 141 lb (64 kg), last menstrual period 06/28/2000.There is no height or weight on file to calculate BMI.  ?General Appearance: NA  ?Eye Contact:  NA  ?Speech:  Clear and Coherent  ?Volume:  Normal  ?Mood:  Euthymic  ?Affect:  NA  ?Thought Process:  Goal Directed  ?Orientation:  Full (Time, Place, and Person)  ?Thought Content:  WDL  ?Suicidal Thoughts:  No  ?Homicidal Thoughts:  No  ?Memory:  Immediate;   Good ?Recent;   Good ?Remote;   Good  ?Judgement:  Intact  ?Insight:  Present  ?Psychomotor Activity:  NA  ?Concentration:  Concentration: Good and Attention Span: Good  ?Recall:  Good  ?Fund of Knowledge:  Good  ?Language:  Good  ?Akathisia:  No  ?Handed:  Right  ?AIMS (if indicated):     ?Assets:  Communication Skills ?Desire for Improvement ?Housing ?Social Support ?Talents/Skills ?Transportation  ?ADL's:  Intact  ?Cognition:  WNL  ?Sleep:   better  ? ? ? ? ?Assessment and Plan: ?Major depressive disorder, recurrent.  Anxiety. ? ?We talk about current medication.  She like to keep the Wellbutrin and Effexor at present dose.  She is actively looking for a new job which she can work virtually.  I encouraged to continue therapy with  Peggy Bynum.  She like to have a Xanax prescribed from our office.  She usually takes 0.25 mg as needed.  She has desire to come off from the Xanax in the future however I recommended at this time her job is very stressful and recommend to keep until she find a new job.  She agreed with the plan.  We will follow up in 2 months.  Continue Effexor 112.5 mg daily, Wellbutrin XL 300 mg daily and Xanax 0.25 mg take as needed. ? ?Follow Up Instructions: ? ?  ?I discussed the assessment and treatment plan with the patient. The patient was provided an opportunity to ask questions and all were answered. The patient agreed with the plan and  demonstrated an understanding of the instructions. ?  ?The patient was advised to call back or seek an in-person evaluation if the symptoms worsen or if the condition fails to improve as anticipated. ? ?I provided 22 minutes of non-face-to-face time during this encounter. ? ? ?Cleotis Nipper, MD  ?

## 2021-09-14 ENCOUNTER — Other Ambulatory Visit (HOSPITAL_COMMUNITY): Payer: Self-pay | Admitting: Psychiatry

## 2021-09-14 ENCOUNTER — Ambulatory Visit (INDEPENDENT_AMBULATORY_CARE_PROVIDER_SITE_OTHER): Payer: 59 | Admitting: Psychiatry

## 2021-09-14 ENCOUNTER — Encounter (HOSPITAL_COMMUNITY): Payer: Self-pay

## 2021-09-14 ENCOUNTER — Other Ambulatory Visit: Payer: Self-pay

## 2021-09-14 DIAGNOSIS — F419 Anxiety disorder, unspecified: Secondary | ICD-10-CM

## 2021-09-14 DIAGNOSIS — F331 Major depressive disorder, recurrent, moderate: Secondary | ICD-10-CM

## 2021-09-14 NOTE — Plan of Care (Signed)
Patient participated in development of plan. °

## 2021-09-14 NOTE — Progress Notes (Signed)
Virtual Visit via Video Note ? ?I connected with [name] [name] on 09/14/21 at 3:14 PM EDT  by a video enabled telemedicine application and verified that I am speaking with the correct person using two identifiers. ? ?Location: ?Patient: Home ?Provider: [facility] [place] office  ?  ?I discussed the limitations of evaluation and management by telemedicine and the availability of in person appointments. The patient expressed understanding and agreed to proceed. ? ? ?I provided 44 minutes of non-face-to-face time during this encounter. ? ? ?Marwa Fuhrman E Savas Elvin, LCSW ? ? ? ? ? ?Session Time:  Monday 08/31/2021 3:16 PM - 4:00 PM ? ?Participation Level: Active ? ?Behavioral Response: CasualAlert/depressed,  ? ?Type of Therapy: Individual Therapy ? ?Treatment Goals addressed: Reduce frequency, intensity, and duration of depressive symptoms as evidenced by reducing.'s of depressive symptoms from 5 days/week to 3 days/week per patient's report, practice behavioral activation skills 2 times per week for the next 12 weeks ? ?Progress on goals: Progressing ? ?Interventions: CBT and Supportive ? ? ? ?Summary: [name] [name] is a 56 y.o. female who is referred for sevices for continuity of care by therapist Hilbert Odor while therapist is on temporary leave. She denies any psychiatric hospitalizations. She has been in outpatient therapy for a year and currently sees psychiatrist [name] for medication management.  She reports needing help with stress management mainly with work issues.  Patient reports being under a lot of stress due to work demands and says it has implications for her diabetes management.  She reports tendency to internalize thoughts and feelings. She reports frequent worry about not getting information timely from coworkers to perform her job.  Current symptoms include difficulty concentrating, fatigue, thoughts/feelings of hopelessness, irritability, sleep difficulty, tearfulness, fatigue, and excessive  worry.    ?  ?Patient last was seen via virtual visit about 2- 3 weeks ago.  Patient continues to report symptoms of depression.  She continues continues to report work stress but has increased efforts to set/maintain limits as well as take mental breaks at work.  However, she reports decreased behavioral activation.  She has been trying to read for pleasure but reports little to no involvement in any other activities.  She reports social withdrawal.  She also continues to battle negative thoughts about self when at work.  She practicing mindfulness activity and says it provided some relief in the moment. ? ?Suicidal/Homicidal: Nowithout intent/plan ? ?Therapist Response: reviewed symptoms, praised and reinforced patient's efforts to set and maintain limits, discussed lapse versus relapse of depression, reviewed early warning signs, reviewed ways to intervene, developed plan with patient to increase behavioral activation by scheduling time with a friend, plan schedule for spring cleaning a couple of hours on the weekend,  and pursue her plans to attend a yoga class, also assisted patient identify ways to use assertiveness skills to discuss recent concerns and work priorities with her manager, reviewed/revise treatment plan, obtain patient's permission to electronically signed plan as this was a virtual visit  ? ?plan: Return in 2 weeks ? ?Diagnosis: Axis I: MDD ?   Anxiety ? ?  ?Collaboration of Care: Psychiatrist AEB patient works with psychiatrist [name], clinician reviewed medical chart ? ?Patient/Guardian was advised Release of Information must be obtained prior to any record release in order to collaborate their care with an outside provider. Patient/Guardian was advised if they have not already done so to contact the registration department to sign all necessary forms in order for [place] to release information regarding  their care.  ? ?Consent: Patient/Guardian gives verbal consent for treatment and assignment  of benefits for services provided during this visit. Patient/Guardian expressed understanding and agreed to proceed.  ? ? ?Ainsley Deakins E Corneshia Hines, LCSW ?09/14/2021  ? ? ? ?  ?

## 2021-09-17 ENCOUNTER — Other Ambulatory Visit (HOSPITAL_COMMUNITY): Payer: Self-pay | Admitting: *Deleted

## 2021-09-17 ENCOUNTER — Ambulatory Visit: Payer: Managed Care, Other (non HMO) | Admitting: Neurology

## 2021-09-17 ENCOUNTER — Telehealth (HOSPITAL_COMMUNITY): Payer: Self-pay

## 2021-09-17 ENCOUNTER — Encounter: Payer: Self-pay | Admitting: Neurology

## 2021-09-17 ENCOUNTER — Telehealth (HOSPITAL_COMMUNITY): Payer: Self-pay | Admitting: *Deleted

## 2021-09-17 ENCOUNTER — Other Ambulatory Visit (HOSPITAL_COMMUNITY): Payer: Self-pay

## 2021-09-17 VITALS — BP 104/64 | HR 102 | Ht 64.0 in | Wt 144.6 lb

## 2021-09-17 DIAGNOSIS — F419 Anxiety disorder, unspecified: Secondary | ICD-10-CM

## 2021-09-17 DIAGNOSIS — G4711 Idiopathic hypersomnia with long sleep time: Secondary | ICD-10-CM | POA: Diagnosis not present

## 2021-09-17 DIAGNOSIS — F331 Major depressive disorder, recurrent, moderate: Secondary | ICD-10-CM

## 2021-09-17 DIAGNOSIS — Z9989 Dependence on other enabling machines and devices: Secondary | ICD-10-CM | POA: Diagnosis not present

## 2021-09-17 DIAGNOSIS — G4733 Obstructive sleep apnea (adult) (pediatric): Secondary | ICD-10-CM

## 2021-09-17 MED ORDER — BUPROPION HCL ER (XL) 300 MG PO TB24: 300.0000 mg | ORAL_TABLET | ORAL | 0 refills | Status: DC

## 2021-09-17 MED ORDER — VENLAFAXINE HCL ER 37.5 MG PO CP24: ORAL_CAPSULE | ORAL | 0 refills | Status: DC

## 2021-09-17 NOTE — Patient Instructions (Signed)
It was nice to see you again as always.  Please talk to your primary care about your blood pressure and keeping a log or trying blood pressure medication at some point.   ?We will maintain you on the current Adderall prescription.  Please try the new nasal mask for your CPAP, see if you like it.  Follow-up in 1 year, call us or email [place] with any interim questions or concerns. ?

## 2021-09-17 NOTE — Telephone Encounter (Signed)
Pt called requesting refills of Wellbutrin XL 300 mg, and Effexor 37.5 mg (112.5 mg total). [place] shows lst written on 09/11/21 [facility] [address] Writer spoke with Lysbeth Galas [facility] this [facility] and was advised that per pt insurance she requires 90 day scripts on both meds. Pt next scheduled appointment is on 11/27/21. Please review and advise.  ?

## 2021-09-17 NOTE — Progress Notes (Signed)
Subjective:  ?  ?Patient ID: [name] SimasDemetria [name] is a 56 y.o. female. ? ?HPI ? ? ? ?Interim history:  ? ?[name] [name] is a 56 year old right handed female, with an underlying history of hypothyroidism, DM type I, anxiety, depression (followed by Dr. Lolly MustacheArfeen), daytime somnolence, obstructive sleep apnea on CPAP, Hx of cognitive complaints in the past, Hx of an episode of confusion (neg. w/u in the hospital for TIA concern in 03/2017), who presents for FU consultation of her sleep disorder, including obstructive sleep apnea and her daytime somnolence. The patient is unaccompanied today and presents for her 7082-month checkup. I last saw her on 09/16/2020, at which time she was not fully compliant with her CPAP.  She reported difficulty tolerating the nasal pillows because of soreness around the nostrils.  We changed her interface to nasal cushion.  She was also interested in getting a travel CPAP.  She was doing well with her Adderall low-dose 10 mg twice daily.  She was following regularly with psychiatry and her therapist.  I had prescribed a travel CPAP machine previously.  She was advised to get in touch with her DME regarding the mask change and how to obtain a travel CPAP. ? ?Today, 09/17/2021: I reviewed her CPAP compliance data from 08/19/2021 ?09/17/2021 which is a total of 30 days, during which time she used her machine 16 days with percent use days greater than 4 hours and 50% only, residual AHI at goal, leak acceptable, pressure at 9 cm with EPR of 2.  She reports that she has trouble tolerating the nasal cushion and generally has sensitivity to her skin because she uses a prescription hydroquinone for her melasma.  It does make her skin sensitive and peel.  She is willing to continue to use her CPAP regularly and is trying her best to tolerate it.  The nasal cushion rubs against the nose and makes skin irritation worse, she is trying to use Neosporin at times and some other protective cream during the day.  She is  willing to try a nasal mask, we gave her a sample for Respironics dream wisp nasal mask as well as a medium headgear for her nasal cushion as she has a small headgear and it is too tight for her.  She is doing well with her Adderall at the current dose.  She wanted to come off the Xanax and tried gabapentin with her psychiatrist but it was not as effective, she was worried about taking Xanax daily but she is on just a small dose, 0.25 mg twice daily as needed and she usually skips the weekends.  Her psychiatrist reassured her that she was not on a high dose and not currently at risk for benzodiazepine addiction.  She is very cautious with her medication.  She is otherwise doing well.  Primary care restarted her on prescription vitamin D. ?  ?The patient's allergies, current medications, family history, past medical history, past social history, past surgical history and problem list were reviewed and updated as appropriate.  ?  ?Previously (copied from previous notes for reference):  ? ? ?I saw her on 09/17/2019, at which time she reported some lapses in treatment in her CPAP because of not having a mattress.  On average, she was using her CPAP much better, over 7 hours.  She reported that sleep is more restful and she had indeed been able to sleep more.  She was using Adderall immediate release 10 mg twice daily, felt stable mood wise. ?  ?  I reviewed her CPAP compliance data from 08/16/2020 through 09/14/2020, which is a total of 30 days, during which time she used her machine only 10 days with percent use days greater than 4 hours at 30% only, indicating significantly suboptimal compliance, average usage for days on treatment of 7 hours and 30 minutes, residual AHI at goal at 0.5/h, leak on the higher side with a 95th percentile at 17.2 L/min on a pressure of 9 cm with EPR of 2.  ?  ?  ?I saw her on 02/22/2019, at which time she felt she was better with regards to her depression.  She had some medication changes under  her psychiatrist.  She was generally speaking compliant with her CPAP.  She was able to come off the long-acting Adderall.  She was advised to continue with her CPAP and her medication regimen and follow-up routinely in 6 months.   ?  ?I reviewed her CPAP compliance data from 08/12/2019 through 09/10/2019, which is a total of 30 days, during which time she used her machine 18 days with percent used days greater than 4 hours at 53%, indicating slightly suboptimal compliance With regards to using the machine more than 4 hours but her average usage has been much more compared to previous data, with an average usage of 7 hours and 26 minutes for days on treatment, residual AHI at goal at 0.6/h, leak on the higher end with a 95th percentile at 17.1 L/min on a pressure of 9 cm with EPR of 2.  ?  ?I reviewed her CPAP compliance data from 01/22/2019 through 02/20/2019 which is a total of 30 days, during which time she used her machine 23 days with percent used days greater than 4 hours at 67%, indicating slightly suboptimal compliance with an average usage of 5 hours and 57 minutes, residual AHI at goal at 0.3/h, leak acceptable with a 95th percentile at 14.1 L/min on a pressure of 9 cm with EPR of 2.    ?  ?I saw her on 08/23/2018, at which time she was using her CPAP regularly.  She had not been taking her stimulants because she had other medications she had started, she had also received a new diabetes insulin pump.  She has had some low back pain.  She was seeing a chiropractor and had done physical therapy.  She had started gabapentin and Mobic per orthopedics.  She also had a prescription for Robaxin.  She has been struggling with depression and had to reschedule her appointment with psychiatry.  I continued her on Adderall.  ?   ?I saw her on 02/13/2018, at which time she was suboptimal with her CPAP compliance. She had experienced more nighttime anxiety. We kept her Adderall and Adderall long-acting the same. ?  ?I  reviewed her CPAP compliance data from 07/23/2018 through 08/21/2018 which is a total of 30 days, during which time she used her machine 22 days with percent used days greater than 4 hours at 63%, indicating slightly suboptimal compliance with an average usage of 6 hours and 20 minutes, residual AHI at goal at 0.3 per hour, leak on the low side with the 95th percentile at 7.4 L/m on a pressure of 9 cm with EPR of 2. Overall, her compliance is a little bit better compared to 6 months ago. She has in fact been using the CPAP for almost 2 hours more on average, compared to our last.  ?  ?I saw her on 08/15/2017, at which time  she reported feeling stable. She was taking immediate release Adderall in the morning 20 mg, and long-acting Adderall at work, total of 50 mg and generic Nuvigil around lunchtime. ?  ?I reviewed her CPAP compliance data from 01/10/2018 through 02/08/2018 which is a total of 30 days, during which time she used her CPAP 24 days with percent used days greater than 4 hours at 50% only, indicating suboptimal compliance with an average usage of 4 hours and 32 minutes, residual AHI at goal at 0.3 per hour, leak on the higher side with the 95th percentile at 19.1 L/m on a pressure of 9 cm with EPR of 2.  ?  ?I saw her on 06/09/2017 for a sooner than scheduled appointment due to recent hospitalization for confusion and TIA workup. She had a head CT without contrast in October 2018 which was negative for any acute changes EKG was unremarkable, A1c was suboptimal at 7.9, brain MRI without contrast on 04/18/2017 showed no acute intracranial abnormality. She was not fully compliant with her CPAP at the time and was encouraged to be fully compliant with CPAP therapy. I suggested we request neuropsychological evaluation and compare with prior results. I also suggested we proceed with an EEG. She had an EEG on 06/15/17, and I reviewed the results: CONCLUSION: ?This is a normal awake and sleep EEG.  There is no  electrodiagnostic evidence of epileptiform discharge. ?  ?We called her with her test result.  ?  ?I reviewed her CPAP compliance data from 07/12/2017 through 06/09/2018 which is a total of 30 days, during whi

## 2021-09-17 NOTE — Telephone Encounter (Signed)
[facility] FROM [facility] [address] IN [place] STATING "MISSING/ILLEGIBLE INFORMATION ON RX"  ?WRITER CALLED PHARMACY AND THEY STATED THE PT'S INSURANCE WOULD ONLY COVER A 90 DAY SUPPLY. WRITER RESENT IT IN FOR #90 W/0 REFILLS ?

## 2021-09-17 NOTE — Telephone Encounter (Signed)
Ok to send 90 days 

## 2021-09-21 ENCOUNTER — Telehealth (HOSPITAL_COMMUNITY): Payer: Self-pay

## 2021-09-21 DIAGNOSIS — F419 Anxiety disorder, unspecified: Secondary | ICD-10-CM

## 2021-09-21 MED ORDER — ALPRAZOLAM 0.25 MG PO TABS: 0.2500 mg | ORAL_TABLET | Freq: Every day | ORAL | 1 refills | Status: DC | PRN

## 2021-09-21 NOTE — Telephone Encounter (Signed)
Patient called regarding her Alprazolam .25 mg. Please clarify the sig which states "take 2 tabs (0.5mg  total) po BID PRN." #60 0 refills was sent in on 3/17 which gives pt a 15 day supply. Pt stated she wasn't sure if she should have a 30 day supply as opposed to 15. Please review and advise. Thank you ?

## 2021-09-21 NOTE — Telephone Encounter (Signed)
She takes Xanax 0.25 mg as needed.  I sent a new prescription of 30 pills with 1 more additional refill.   ?

## 2021-09-21 NOTE — Telephone Encounter (Signed)
NOTIFIED/RELAYED MESSAGE TO PATIENT ?

## 2021-09-28 ENCOUNTER — Ambulatory Visit (INDEPENDENT_AMBULATORY_CARE_PROVIDER_SITE_OTHER): Payer: 59 | Admitting: Psychiatry

## 2021-09-28 DIAGNOSIS — F331 Major depressive disorder, recurrent, moderate: Secondary | ICD-10-CM | POA: Diagnosis not present

## 2021-09-28 NOTE — Progress Notes (Signed)
Virtual Visit via Video Note ? ?I connected with [name] [name] on 09/28/21 at 3:10 PM EDT  by a video enabled telemedicine application and verified that I am speaking with the correct person using two identifiers. ? ?Location: ?Patient: Home ?Provider: [facility] [place] [facility]  ?  ?I discussed the limitations of evaluation and management by telemedicine and the availability of in person appointments. The patient expressed understanding and agreed to proceed. ? ? ?I provided 50 minutes of non-face-to-face time during this encounter. ? ? ?Sevin Langenbach E Bowie Delia, LCSW ? ? ? ? ? ?Session Time:  Monday 09/28/2021 3:10 PM - 4:00 PM ? ?Participation Level: Active ? ?Behavioral Response: CasualAlert/depressed,  ? ?Type of Therapy: Individual Therapy ? ?Treatment Goals addressed: Reduce frequency, intensity, and duration of depressive symptoms as evidenced by reducing.'s of depressive symptoms from 5 days/week to 3 days/week per patient's report, practice behavioral activation skills 2 times per week for the next 12 weeks ? ?Progress on goals: Progressing ? ?Interventions: CBT and Supportive ? ? ? ?Summary: [name] [name] is a 56 y.o. female who is referred for sevices for continuity of care by therapist Hilbert Odor while therapist is on temporary leave. She denies any psychiatric hospitalizations. She has been in outpatient therapy for a year and currently sees psychiatrist [name] for medication management.  She reports needing help with stress management mainly with work issues.  Patient reports being under a lot of stress due to work demands and says it has implications for her diabetes management.  She reports tendency to internalize thoughts and feelings. She reports frequent worry about not getting information timely from coworkers to perform her job.  Current symptoms include difficulty concentrating, fatigue, thoughts/feelings of hopelessness, irritability, sleep difficulty, tearfulness, fatigue, and excessive  worry.    ?  ?Patient last was seen via virtual visit about 2 weeks ago.  Patient continues to report experiencing depressive symptoms 5 days/week but increased behavioral activation skills.  Patient reports going out walking and window shopping with friends this past weekend.  She also reports going to a store by herself although she feels anxious.  Patient also reports reading a sci-fi novel this past weekend.  Patient reports feeling better after participating in activities especially involvement with her friends.  She still plans to sign up for a yoga class.  She continues to experience work stress but reports coping better at work by using leaves on a stream exercise as well as the beach visualization.  She states this has put some distance from stress and ruminating thoughts about her job.  She states now being able to let go of some thoughts.  She reports using assertiveness skills to express concerns to her manager regarding priorities.   ? ?Suicidal/Homicidal: Nowithout intent/plan ? ?Therapist Response: reviewed symptoms, praised and reinforced patient's efforts to increase behavioral activation/socialization, discussed effects on mood/thoughts/behavior, assisted patient identify ways to maintain consistent behavioral activation/socialization, developed plan with patient to use daily planning, also discussed ways to use an activity menu to help select activities, develop plan with patient to call friends by Wednesday of each week regarding weekend plans, also developed plan with patient to attend a yoga class by next session, assisted patient identify and address thoughts and processes that may in inhibit implementation of plan, assisted patient develop list of reasons to implement plan/list of consequences for not implementing plan, assigned patient to review lists daily  ? ?plan: Return in 2 weeks ? ?Diagnosis: Axis I: MDD ?   Anxiety ? ?  ?  Collaboration of Care: Psychiatrist AEB patient works with  psychiatrist [name], clinician reviewed medical chart ? ?Patient/Guardian was advised Release of Information must be obtained prior to any record release in order to collaborate their care with an outside provider. Patient/Guardian was advised if they have not already done so to contact the registration department to sign all necessary forms in order for [place] to release information regarding their care.  ? ?Consent: Patient/Guardian gives verbal consent for treatment and assignment of benefits for services provided during this visit. Patient/Guardian expressed understanding and agreed to proceed.  ? ? ?Madysun Thall E Briona Korpela, LCSW ?09/28/2021  ? ? ? ?  ?

## 2021-10-12 ENCOUNTER — Ambulatory Visit (INDEPENDENT_AMBULATORY_CARE_PROVIDER_SITE_OTHER): Payer: 59 | Admitting: Psychiatry

## 2021-10-12 DIAGNOSIS — F331 Major depressive disorder, recurrent, moderate: Secondary | ICD-10-CM

## 2021-10-12 NOTE — Progress Notes (Signed)
Virtual Visit via Video Note ? ?I connected with [name] [name] on 10/12/21 at 3:15 PM EDT  by a video enabled telemedicine application and verified that I am speaking with the correct person using two identifiers. ? ?Location: ?Patient: Home ?Provider: [facility] [place] office  ?  ?I discussed the limitations of evaluation and management by telemedicine and the availability of in person appointments. The patient expressed understanding and agreed to proceed. ? ? ?I provided 45 minutes of non-face-to-face time during this encounter. ? ? ?Donta Fuster E Maritza Hosterman, LCSW ? ? ? ? ? ? ?Session Time:  Monday 10/12/2021 3:15 PM - 4:00 PM  ? ?Participation Level: Active ? ?Behavioral Response: CasualAlert/depressed,  ? ?Type of Therapy: Individual Therapy ? ?Treatment Goals addressed: Reduce frequency, intensity, and duration of depressive symptoms as evidenced by reducing.'s of depressive symptoms from 5 days/week to 3 days/week per patient's report, practice behavioral activation skills 2 times per week for the next 12 weeks ? ?Progress on goals: Progressing ? ?Interventions: CBT and Supportive ? ? ? ?Summary: [name] [name] is a 56 y.o. female who is referred for sevices for continuity of care by therapist Hilbert Odor while therapist is on temporary leave. She denies any psychiatric hospitalizations. She has been in outpatient therapy for a year and currently sees psychiatrist [name] for medication management.  She reports needing help with stress management mainly with work issues.  Patient reports being under a lot of stress due to work demands and says it has implications for her diabetes management.  She reports tendency to internalize thoughts and feelings. She reports frequent worry about not getting information timely from coworkers to perform her job.  Current symptoms include difficulty concentrating, fatigue, thoughts/feelings of hopelessness, irritability, sleep difficulty, tearfulness, fatigue, and  excessive worry.    ?  ?Patient last was seen via virtual visit about 2 weeks ago.  Patient reports increased behavioral activation and improved mood since last session.  Patient reports she did not receive activity menu and daily planning forms via mail but still planned activities.  She reports going out with friends the last 2 weekends.  She also reports walking and doing errands.  She reports initially not feeling like becoming involved in activities but feeling better once she did.  She has made efforts to increase involvement in exercise.  She did not attend a yoga class but did do a Pilates class via DVD.   ?Suicidal/Homicidal: Nowithout intent/plan ? ?Therapist Response: reviewed symptoms, praised and reinforced patient's efforts to increase behavioral activation/socialization, discussed effects on mood/thoughts/behavior, assisted patient identify ways to increase as well as maintain consistent behavioral activation/socialization, assisted patient began to identify self nurturing activities, developed plan with patient to use daily planning forms and activity menu, sent patient information via email. ? ?plan: Return in 2 weeks ? ?Diagnosis: Axis I: MDD ?   Anxiety ? ?  ?Collaboration of Care: Psychiatrist AEB patient works with psychiatrist [name], clinician reviewed medical chart ? ?Patient/Guardian was advised Release of Information must be obtained prior to any record release in order to collaborate their care with an outside provider. Patient/Guardian was advised if they have not already done so to contact the registration department to sign all necessary forms in order for [place] to release information regarding their care.  ? ?Consent: Patient/Guardian gives verbal consent for treatment and assignment of benefits for services provided during this visit. Patient/Guardian expressed understanding and agreed to proceed.  ? ? ?Abimbola Aki E Raquel Racey, LCSW ?10/12/2021  ? ? ? ?  ?

## 2021-10-23 ENCOUNTER — Other Ambulatory Visit (HOSPITAL_COMMUNITY): Payer: Self-pay | Admitting: Psychiatry

## 2021-10-23 DIAGNOSIS — F331 Major depressive disorder, recurrent, moderate: Secondary | ICD-10-CM

## 2021-10-23 DIAGNOSIS — F419 Anxiety disorder, unspecified: Secondary | ICD-10-CM

## 2021-10-26 ENCOUNTER — Ambulatory Visit (INDEPENDENT_AMBULATORY_CARE_PROVIDER_SITE_OTHER): Payer: 59 | Admitting: Psychiatry

## 2021-10-26 DIAGNOSIS — F331 Major depressive disorder, recurrent, moderate: Secondary | ICD-10-CM

## 2021-10-26 NOTE — Progress Notes (Signed)
Virtual Visit via Video Note ? ?I connected with [name] [name] on 10/26/21 at 3:10 PM EDT  by a video enabled telemedicine application and verified that I am speaking with the correct person using two identifiers. ? ?Location: ?Patient: Home ?Provider: [facility] [place] office  ?  ?I discussed the limitations of evaluation and management by telemedicine and the availability of in person appointments. The patient expressed understanding and agreed to proceed. ? ? ?I provided 50 minutes of non-face-to-face time during this encounter. ? ? ?Bryam Taborda E Mena Lienau, LCSW ? ? ? ? ? ? ?Session Time:  Monday 10/26/2021 3:10 PM - 4:00 PM  ? ?Participation Level: Active ? ?Behavioral Response: CasualAlert/depressed,  ? ?Type of Therapy: Individual Therapy ? ?Treatment Goals addressed: Reduce frequency, intensity, and duration of depressive symptoms as evidenced by reducing.'s of depressive symptoms from 5 days/week to 3 days/week per patient's report, practice behavioral activation skills 2 times per week for the next 12 weeks ? ?Progress on goals: Progressing ? ?Interventions: CBT and Supportive ? ? ? ?Summary: [name] [name] is a 56 y.o. female who is referred for sevices for continuity of care by therapist Hilbert Odor while therapist is on temporary leave. She denies any psychiatric hospitalizations. She has been in outpatient therapy for a year and currently sees psychiatrist [name] for medication management.  She reports needing help with stress management mainly with work issues.  Patient reports being under a lot of stress due to work demands and says it has implications for her diabetes management.  She reports tendency to internalize thoughts and feelings. She reports frequent worry about not getting information timely from coworkers to perform her job.  Current symptoms include difficulty concentrating, fatigue, thoughts/feelings of hopelessness, irritability, sleep difficulty, tearfulness, fatigue, and  excessive worry.    ?  ?Patient last was seen via virtual visit about 2 weeks ago.  Patient reports experiencing increased fatigue as well as excessive sleepiness since last session.  She attributes this to dosage of Adderall.  Patient agrees to talk with psychiatrist [name] regarding medication concerns.  She reports use of daily planning form/activity menu and increased behavioral activation since last session.  She engaged in such activities such as doing household tasks, walking, performing a nice gesture for someone, talking to friends, and taking a bubble bath per her report.  Patient reports feeling better after engaging in activity.  Per patient's report, she did not plan events ahead of time and used a pencil to write in events rather than a pen as she feared she may not complete the event.   ? ?Suicidal/Homicidal: Nowithout intent/plan ? ?Therapist Response: reviewed symptoms, praised and reinforced patient's efforts to use daily planning forms and increased behavioral activation/socialization, discussed effects on mood/thoughts/behavior, discussed rationale for planning events ahead of time, assisted patient identify/challenge/replace thoughts inhibiting planning events ahead of time, assisted patient identify connection between automatic thoughts/assumptions-rules/core beliefs using example in patient's life, assisted patient identify the effects on her behavior, discussed rationale for and developed plan with patient to resume use of thought log and to review unhelpful thinking styles handout, bring to next session, also will send patient thought logs via mail  ? ?plan: Return in 2 weeks ? ?Diagnosis: Axis I: MDD ?   Anxiety ? ?  ?Collaboration of Care: Psychiatrist AEB patient works with psychiatrist [name], clinician encouraged patient to contact psychiatrist [name] regarding medication concerns. ? ?Patient/Guardian was advised Release of Information must be obtained prior to any record  release  in order to collaborate their care with an outside provider. Patient/Guardian was advised if they have not already done so to contact the registration department to sign all necessary forms in order for [place] to release information regarding their care.  ? ?Consent: Patient/Guardian gives verbal consent for treatment and assignment of benefits for services provided during this visit. Patient/Guardian expressed understanding and agreed to proceed.  ? ? ?[name] [name] E Breann Losano, LCSW ?10/26/2021  ? ? ? ?  ?

## 2021-11-16 ENCOUNTER — Ambulatory Visit (HOSPITAL_COMMUNITY): Payer: 59 | Admitting: Psychiatry

## 2021-11-16 ENCOUNTER — Other Ambulatory Visit: Payer: Self-pay | Admitting: Neurology

## 2021-11-16 DIAGNOSIS — G4733 Obstructive sleep apnea (adult) (pediatric): Secondary | ICD-10-CM

## 2021-11-16 DIAGNOSIS — G4711 Idiopathic hypersomnia with long sleep time: Secondary | ICD-10-CM

## 2021-11-16 MED ORDER — AMPHETAMINE-DEXTROAMPHETAMINE 10 MG PO TABS: 10.0000 mg | ORAL_TABLET | Freq: Two times a day (BID) | ORAL | 0 refills | Status: DC

## 2021-11-16 NOTE — Telephone Encounter (Signed)
Pt is requesting a refill for amphetamine-dextroamphetamine (ADDERALL) 10 MG tablet .  Pharmacy: [id number]  

## 2021-11-16 NOTE — Telephone Encounter (Signed)
Last seen 09/17/2021

## 2021-11-19 ENCOUNTER — Other Ambulatory Visit (HOSPITAL_COMMUNITY): Payer: Self-pay | Admitting: *Deleted

## 2021-11-19 DIAGNOSIS — F419 Anxiety disorder, unspecified: Secondary | ICD-10-CM

## 2021-11-19 MED ORDER — ALPRAZOLAM 0.25 MG PO TABS: 0.2500 mg | ORAL_TABLET | Freq: Every day | ORAL | 0 refills | Status: DC | PRN

## 2021-11-27 ENCOUNTER — Telehealth (HOSPITAL_COMMUNITY): Payer: 59 | Admitting: Psychiatry

## 2021-11-30 ENCOUNTER — Telehealth (HOSPITAL_COMMUNITY): Payer: Self-pay | Admitting: Psychiatry

## 2021-11-30 ENCOUNTER — Ambulatory Visit (HOSPITAL_COMMUNITY): Payer: 59 | Admitting: Psychiatry

## 2021-11-30 NOTE — Telephone Encounter (Signed)
Therapist attempted to contact patient twice via text through caregility platform, no response.  Therapist called patient, left message indicating attempt, and requesting patient call office. 

## 2021-12-03 ENCOUNTER — Telehealth (HOSPITAL_COMMUNITY): Payer: Self-pay | Admitting: Psychiatry

## 2021-12-03 ENCOUNTER — Telehealth (HOSPITAL_BASED_OUTPATIENT_CLINIC_OR_DEPARTMENT_OTHER): Payer: 59 | Admitting: Psychiatry

## 2021-12-03 ENCOUNTER — Encounter (HOSPITAL_COMMUNITY): Payer: Self-pay | Admitting: Psychiatry

## 2021-12-03 DIAGNOSIS — F419 Anxiety disorder, unspecified: Secondary | ICD-10-CM

## 2021-12-03 DIAGNOSIS — F331 Major depressive disorder, recurrent, moderate: Secondary | ICD-10-CM

## 2021-12-03 MED ORDER — VENLAFAXINE HCL ER 37.5 MG PO CP24: ORAL_CAPSULE | ORAL | 0 refills | Status: DC

## 2021-12-03 MED ORDER — BUPROPION HCL ER (XL) 300 MG PO TB24: 300.0000 mg | ORAL_TABLET | ORAL | 0 refills | Status: DC

## 2021-12-03 MED ORDER — ALPRAZOLAM 0.25 MG PO TABS: 0.2500 mg | ORAL_TABLET | Freq: Two times a day (BID) | ORAL | 0 refills | Status: DC | PRN

## 2021-12-03 NOTE — Telephone Encounter (Signed)
D:  [name] referred pt to MH-IOP.  A:  Placed call and oriented pt.  Pt states she would like to contact HR to discuss working remotely from home.  "I would attend the groups in the morning and work remotely in the afternoon."  Encouraged pt to call the case manager back once she talks to HR so she can be given a start date.  Inform [name].  R:  Patient receptive.

## 2021-12-03 NOTE — Progress Notes (Signed)
Virtual Visit via Telephone Note  I connected with [name] [name] on 12/03/21 at 10:00 AM EDT by telephone and verified that I am speaking with the correct person using two identifiers.  Location: Patient: Home Provider: Home Office   I discussed the limitations, risks, security and privacy concerns of performing an evaluation and management service by telephone and the availability of in person appointments. I also discussed with the patient that there may be a patient responsible charge related to this service. The patient expressed understanding and agreed to proceed.   History of Present Illness: Patient evaluated by phone session.  Patient is very anxious, stressed and struggled with anxiety.  She has a lot of stress from work.  She noticed lately increased blood pressure reading at work.  She has a visiting nurse at work and in past few times there are blood pressure readings which are above 150 and the highest is 164/90 earlier this month.  Her PCP Dr. Laurene Footman from [facility] recommended to monitor her blood pressure regularly.  She recently had a visit to her neurologist for CPAP and daytime somnolence.  She is taking Adderall to help her daytime somnolence.  In the beginning she wanted to cut down her Xanax to take as needed.  We have cut down her Xanax from 0.25 mg twice a day to only as needed.  Now she feels that she need to go back on twice a day.  She is easy to cry and get easily emotional.  She feels not able to relax.  In the night she is thinking too much and having racing thoughts.  Though she denies any suicidal thoughts or homicidal thoughts or any feeling of hopelessness but is still have a lot of negative and ruminative thoughts.  She is trying to keep herself busy by walking, talking to her friends and going to dinner with them but remain very anxious especially at work.  She denies any hallucination or any paranoia.  Energy level is fair.  Her appetite is  okay.  Her weight remains unchanged from the past.  Her last hemoglobin A1c was 6.5.  She is working as a Warden/ranger in [employer] and she feels that she need to look for other job and she has been trying but so far she has no luck.  Past psychiatric history; No h/o inpatient treatment or suicidal attempt.  No h/o psychosis, mania, or abuse.  Took Wellbutrin prescribed by PCP.  Dose increased to 450 few years ago.  We tried Lamictal but caused rash.  Neurologist had prescribed Adderall to help her memory.  Her appetite is okay.  Since she cut down her Adderall she noticed some time feeling tired but she is sleeping better.   Psychiatric Specialty Exam: Physical Exam  Review of Systems  Weight 144 lb (65.3 kg), last menstrual period 06/28/2000.There is no height or weight on file to calculate BMI.  General Appearance: NA  Eye Contact:  NA  Speech:  Clear and Coherent and Normal Rate  Volume:  Normal  Mood:  Anxious  Affect:  NA  Thought Process:  Goal Directed  Orientation:  Full (Time, Place, and Person)  Thought Content:  Rumination  Suicidal Thoughts:  No  Homicidal Thoughts:  No  Memory:  Immediate;   Good Recent;   Good Remote;   Good  Judgement:  Intact  Insight:  Present  Psychomotor Activity:  NA  Concentration:  Concentration: Good and Attention Span: Good  Recall:  Good  Fund of Knowledge:  Good  Language:  Good  Akathisia:  No  Handed:  Right  AIMS (if indicated):     Assets:  Communication Skills Desire for Improvement Housing  ADL's:  Intact  Cognition:  WNL  Sleep:   poor      Assessment and Plan: Major depressive disorder, recurrent.  Anxiety.  I discussed psychosocial stressors especially her job which is very demanding.  She is concerned about her blood pressure which are recently in high numbers.  Now she is closely monitor her readings.  I reviewed her medication and explained that Adderall and venlafaxine can cause high blood pressure.   Patient is taking Adderall for daytime stimulants and she also diagnosed with obstructive sleep apnea.  In the past she wants to try to come off from Xanax but she feels it is difficult now since she is under a lot of stress.  We discussed IOP to help her coping skills.  We also talk about consider FMLA as needed for flareup.  At this time she does not want to come off from her medication but agreed to have discussion with the neurologist on her next appointment to consider a different medication for daytime somnolence.  She does not want to change her antidepressant at this time.  We will continue Effexor 112.5 mg daily, Wellbutrin XL 300 mg daily and we will increase Xanax 0.25 mg to take 2 times a day.  We discussed benzodiazepine dependence, tolerance and withdrawal.  Patient will look into IOP/FMLA and we will refer her to IOP coordinator to provide more information.  Patient like to have a follow-up in 4 weeks.  I recommend if adjusting the Xanax dose does not help her anxiety and then we can consider switching her medication on her next appointment.  Discussed safety concerns at any time having active suicidal thoughts or homicidal thought then she need to call 911 or go to local emergency room.  Encouraged to continue therapy with Florencia Reasons.  Follow Up Instructions:    I discussed the assessment and treatment plan with the patient. The patient was provided an opportunity to ask questions and all were answered. The patient agreed with the plan and demonstrated an understanding of the instructions.   The patient was advised to call back or seek an in-person evaluation if the symptoms worsen or if the condition fails to improve as anticipated.  I provided 34 minutes of non-face-to-face time during this encounter.   Cleotis Nipper, MD

## 2021-12-14 ENCOUNTER — Encounter (HOSPITAL_COMMUNITY): Payer: Self-pay

## 2021-12-14 ENCOUNTER — Ambulatory Visit (HOSPITAL_COMMUNITY): Payer: 59 | Admitting: Psychiatry

## 2021-12-14 ENCOUNTER — Telehealth (HOSPITAL_COMMUNITY): Payer: Self-pay | Admitting: Psychiatry

## 2021-12-14 NOTE — Telephone Encounter (Signed)
Therapist attempted to contact patient via text through caregility platform for scheduled appointment, no response.  Therapist called patient, left message indicating attempt, and requesting patient call office. 

## 2021-12-31 ENCOUNTER — Ambulatory Visit (INDEPENDENT_AMBULATORY_CARE_PROVIDER_SITE_OTHER): Payer: 59 | Admitting: Psychiatry

## 2021-12-31 DIAGNOSIS — F331 Major depressive disorder, recurrent, moderate: Secondary | ICD-10-CM

## 2021-12-31 NOTE — Progress Notes (Signed)
Virtual Visit via Telephone Note  I connected with [name] [name] on 12/31/21 at 3:05 PM EDT  by telephone and verified that I am speaking with the correct person using two identifiers.  Location: Patient:Car Provider: [facility] [place] office    I discussed the limitations, risks, security and privacy concerns of performing an evaluation and management service by telephone and the availability of in person appointments. I also discussed with the patient that there may be a patient responsible charge related to this service. The patient expressed understanding and agreed to proceed.    I provided 45 minutes of non-face-to-face time during this encounter.   [name], LCSW        Session Time:  Thursday  12/31/2021 3:05 PM - 3:50 PM   Participation Level: Active  Behavioral Response: CasualAlert/depressed,   Type of Therapy: Individual Therapy  Treatment Goals addressed: Reduce frequency, intensity, and duration of depressive symptoms as evidenced by reducing.'s of depressive symptoms from 5 days/week to 3 days/week per patient's report, practice behavioral activation skills 2 times per week for the next 12 weeks  Progress on goals: Progressing  Interventions: CBT and Supportive    Summary: [name] [name] is a 56 y.o. female who is referred for sevices for continuity of care by therapist Hilbert Odor while therapist is on temporary leave. She denies any psychiatric hospitalizations. She has been in outpatient therapy for a year and currently sees psychiatrist [name] for medication management.  She reports needing help with stress management mainly with work issues.  Patient reports being under a lot of stress due to work demands and says it has implications for her diabetes management.  She reports tendency to internalize thoughts and feelings. She reports frequent worry about not getting information timely from coworkers to perform her job.  Current symptoms  include difficulty concentrating, fatigue, thoughts/feelings of hopelessness, irritability, sleep difficulty, tearfulness, fatigue, and excessive worry.      Patient last was seen via virtual visit about 2 months ago. Patient reports continuing to experience symptoms of depression but is trying to cope better.  Per her report, she recognized becoming more depressed but then resumed use of daily planning about a month ago.  Per patient's report, this resulted in increased behavioral activation and feeling much better.  She reports doing various activities at home as well as with friends and says this has helped. She continues to express frustration regarding her work environment.  She continues to struggle with identifying, challenging, and replacing negative core beliefs.     Suicidal/Homicidal: Nowithout intent/plan  Therapist Response: reviewed symptoms, praised and reinforced patient's efforts to use daily planning forms and increased behavioral activation/socialization, discussed effects on mood/thoughts/behavior, assisted patient identify/challenge/and replace core beliefs, also began to assist patient identify assumptions rules as well as compensatory strategies, reviewed rationale for resuming use of thought log, developed plan with patient to use between sessions and bring to next session, encouraged patient to continue using daily planning   plan: Return in 2 weeks  Diagnosis: Axis I: MDD    Anxiety    Collaboration of Care: Psychiatrist AEB patient works with psychiatrist [name], clinician encouraged patient to contact psychiatrist [name] regarding medication concerns.  Patient/Guardian was advised Release of Information must be obtained prior to any record release in order to collaborate their care with an outside provider. Patient/Guardian was advised if they have not already done so to contact the registration department to sign all necessary forms in order for [place]  to release  information regarding their care.   Consent: Patient/Guardian gives verbal consent for treatment and assignment of benefits for services provided during this visit. Patient/Guardian expressed understanding and agreed to proceed.    [name], LCSW 12/31/2021

## 2022-01-04 ENCOUNTER — Other Ambulatory Visit (HOSPITAL_COMMUNITY)
Admission: RE | Admit: 2022-01-04 | Discharge: 2022-01-04 | Disposition: A | Discharge status: Home / Self Care | Payer: Managed Care, Other (non HMO) | Source: Ambulatory Visit | Attending: Obstetrics and Gynecology | Admitting: Obstetrics and Gynecology

## 2022-01-04 ENCOUNTER — Ambulatory Visit: Payer: Managed Care, Other (non HMO) | Admitting: Obstetrics and Gynecology

## 2022-01-04 ENCOUNTER — Encounter: Payer: Self-pay | Admitting: Obstetrics and Gynecology

## 2022-01-04 VITALS — BP 104/60 | Ht 64.0 in | Wt 146.0 lb

## 2022-01-04 DIAGNOSIS — R232 Flushing: Secondary | ICD-10-CM

## 2022-01-04 DIAGNOSIS — Z87410 Personal history of cervical dysplasia: Secondary | ICD-10-CM | POA: Insufficient documentation

## 2022-01-04 NOTE — Progress Notes (Unsigned)
GYNECOLOGY  VISIT   HPI: 56 y.o.   Divorced  [place] female   [id number] with Patient's last menstrual period was 06/28/2000.   here for 6 month pap smear.   Had pap 06/11/21 - HGSIL, neg HR HPV.  Colposcopy 07/13/21 koilocytic atypia consistent with LGSIL.   States she is having more hot flashes recently.  She has been on Premarin 0.625 mg since 2019.   Some rare issues with delivery of insulin through her pump.   GYNECOLOGIC HISTORY: Patient's last menstrual period was 06/28/2000. Contraception:  Hyst Menopausal hormone therapy:  Premarin Last mammogram:  07-23-19 Diag.Lt.w/Lt.US//stable probably benign mass/Diag.Lt.w/US in 1 year/BiRads3 Last pap smear:  06-11-21 HSIL:Neg HR HPV, 05/29/20- ASCUS, HPV- neg, 05-21-19 Neg:Neg HR HPV        OB History     Gravida  0   Para  0   Term  0   Preterm  0   AB  0   Living  0      SAB  0   IAB  0   Ectopic  0   Multiple  0   Live Births                 Patient Active Problem List   Diagnosis Date Noted   Abdominal bloating 12/02/2020   Change in bowel habit 12/02/2020   Chronic idiopathic constipation 12/02/2020   Family history of colonic polyps 12/02/2020   Flatulence, eructation and gas pain 12/02/2020   Incontinence of feces 12/02/2020   Irritable bowel syndrome 12/02/2020   Rectal bleeding 12/02/2020   Confusion 11/28/2017   Dizziness 11/28/2017   Insulin pump in place 09/22/2017   Cellulitis 09/21/2017   Diabetic ketoacidosis without coma associated with diabetes mellitus due to underlying condition (HCC) 09/21/2017   AKI (acute kidney injury) (HCC) 09/20/2017   Hyperglycemia 09/20/2017   Hypotension 09/20/2017   Sepsis (HCC) 09/20/2017   Intermittent confusion 04/18/2017   OSA (obstructive sleep apnea) 04/18/2017   HYPOTHYROIDISM 09/21/2007   DIABETES MELLITUS, TYPE I 09/21/2007   ANXIETY DEPRESSION 09/21/2007   NAUSEA 09/21/2007    Past Medical History:  Diagnosis Date   Breast cyst     left   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   Diabetes mellitus    Dry eyes, bilateral 2018   Fall at home 05/2018   injured low back/spine/sciatic nerve--had 2 spinal injections   Ketoacidosis 3810,1751   Melasma 01/2020   Meningitis 06/2001   --hospital   Rheumatoid arthritis (HCC) 2014   Sleep apnea 2013   Sleep apnea 2011   STD (sexually transmitted disease) 6/09   HSV II   Stress    Thyroid disease    hypothyroidism   VAIN I (vaginal intraepithelial neoplasia grade I) 2013    Past Surgical History:  Procedure Laterality Date   ABDOMINAL HYSTERECTOMY     2004   CERVICAL BIOPSY  W/ LOOP ELECTRODE EXCISION  01-24-95   CIN I   COLPOSCOPY  11-21-02   CIN III   COLPOSCOPY  05-05-12   no lesions--needs repeat pap 08/2012 per Dr. Tresa Res   HAND SURGERY  2004   tore tendon   positive HPV  03/21/15   normal pap and negative types 16/18   SOFT TISSUE CYST EXCISION  12/2010   left breast epidermoid inclusion cyst   SPINE SURGERY  05/21/14   TONSILLECTOMY AND ADENOIDECTOMY      Current Outpatient Medications  Medication Sig Dispense Refill  ALPRAZolam (XANAX) 0.25 MG tablet Take 1 tablet (0.25 mg total) by mouth 2 (two) times daily as needed for anxiety. 60 tablet 0   amphetamine-dextroamphetamine (ADDERALL) 10 MG tablet Take 1 tablet (10 mg total) by mouth 2 (two) times daily. 60 tablet 0   Ascorbic Acid (VITAMIN C) 1000 MG tablet Take 1,000 mg by mouth daily.     BD PEN NEEDLE NANO 2ND GEN 32G X 4 MM MISC USE TO ADMINISTER INSULIN 4 TIMES DAILY IF NOT ON INSULIN PUMP     Biotin 10 MG CAPS Take 1 capsule by mouth daily.     buPROPion (WELLBUTRIN XL) 300 MG 24 hr tablet Take 1 tablet (300 mg total) by mouth every morning. 90 tablet 0   Continuous Blood Gluc Sensor (DEXCOM G6 SENSOR) MISC CHANGE EVERY 10 DAYS TO MONITOR BLOOD GLUCOSE CONTINUOUSLY     Continuous Blood Gluc Transmit (DEXCOM G6 TRANSMITTER) MISC CHANGE EVERY 3 MONTHS TO MONITOR BLOOD GLUCOSE  CONTINUOUSLY     estrogens, conjugated, (PREMARIN) 0.625 MG tablet 1 tablet Orally Once a day     gabapentin (NEURONTIN) 100 MG capsule Take 1 capsule (100 mg total) by mouth 2 (two) times daily as needed (anxiety). 30 capsule 0   Insulin Human (INSULIN PUMP) SOLN Inject 25 each into the skin as directed. Throughout the day (Per BS)     Insulin Lispro w/ Trans Port 100 UNIT/ML SOPN INJECT AS DIRECTED 3 TIMES A DAY (USE A MAX TOTAL DAILY DOSE OF 30 UNITS) IF INSULIN PUMP FAILURE     Insulin Pen Needle (BD PEN NEEDLE NANO U/F) 32G X 4 MM MISC Use to administer insulin 4 times daily if not on insulin pump     levothyroxine (SYNTHROID) 137 MCG tablet take one tablet but mouth daily, but skip Sunday     Multiple Vitamins-Minerals (QC WOMENS DAILY MULTIVITAMIN) TABS Take one tablet once daily Oral     venlafaxine XR (EFFEXOR XR) 37.5 MG 24 hr capsule Take three capsule daily 270 capsule 0   Vitamin D, Ergocalciferol, (DRISDOL) 1.25 MG (50000 UNIT) CAPS capsule Take 50,000 Units by mouth once a week.     Vitamin E 400 units TABS Take 1 capsule by mouth daily.     XIIDRA 5 % SOLN Apply to eye 2 (two) times daily.     No current facility-administered medications for this visit.     ALLERGIES: Sulfa antibiotics, Bee venom, Penicillin v potassium, and Lamotrigine  Family History  Problem Relation Age of Onset   Other Mother        cysts   Diabetes Father    Kidney disease Father    Stroke Paternal Aunt    Breast cancer Neg Hx     Social History   Socioeconomic History   Marital status: Divorced    Spouse name: Not on file   Number of children: 0   Years of education: undergrad   Highest education level: Not on file  Occupational History   Occupation: SAP PDM ANALYST    Employer: [employer]    Employer: [employer]  Tobacco Use   Smoking status: Never   Smokeless tobacco: Never  Vaping Use   Vaping Use: Never used  Substance and Sexual Activity   Alcohol use: No     Alcohol/week: 0.0 standard drinks of alcohol   Drug use: No   Sexual activity: Yes    Partners: Male    Birth control/protection: Surgical, Condom    Comment: [facility]  Other Topics Concern   Not on file  Social History Narrative   Consumes 120mg  caffeine daily,left handed   Social Determinants of Health   Financial Resource Strain: Not on file  Food Insecurity: Not on file  Transportation Needs: Not on file  Physical Activity: Not on file  Stress: Not on file  Social Connections: Not on file  Intimate Partner Violence: Not on file    Review of Systems  All other systems reviewed and are negative.   PHYSICAL EXAMINATION:    BP 104/60   Ht 5\' 4"  (1.626 m)   Wt 146 lb (66.2 kg)   LMP 06/28/2000   BMI 25.06 kg/m     General appearance: alert, cooperative and appears stated age   Pelvic: External genitalia:  no lesions              Urethra:  normal appearing urethra with no masses, tenderness or lesions              Bartholins and Skenes: normal                 Vagina: normal appearing vagina with normal color and discharge, no lesions              Cervix: absent                Bimanual Exam:  Uterus: absent              Adnexa: no mass, fullness, tenderness                Chaperone was present for exam:  , CMA  ASSESSMENT  Hx hysterectomy for CIN III.  HGSIL pap with negative HR HPV. Colposcopic biopsy showing atypia, consistent with LGSIlL. Hot flashes.  Current dosage of Premarin since 2019.   PLAN  Pap and HR HPV collected.  We discussed hot flashes in menopause and precipitating factors.  We discussed WHI and risks and benefits of ERT. I encouraged her to reach out to her PCP regarding her recent increase in vasomotor symptoms, which may be also due to other medical issues.  FU for annual exam and prn.   23 min  total time was spent for this patient encounter, including preparation, face-to-face counseling with the patient, coordination of care, and  documentation of the encounter.  An After Visit Summary was printed and given to the patient.

## 2022-01-06 ENCOUNTER — Telehealth (HOSPITAL_COMMUNITY): Payer: 59 | Admitting: Psychiatry

## 2022-01-07 ENCOUNTER — Encounter (HOSPITAL_COMMUNITY): Payer: Self-pay

## 2022-01-07 ENCOUNTER — Encounter (HOSPITAL_COMMUNITY): Payer: 59 | Admitting: Psychiatry

## 2022-01-08 NOTE — Progress Notes (Signed)
No show

## 2022-01-11 LAB — CYTOLOGY - PAP
Comment: NEGATIVE
High risk HPV: NEGATIVE

## 2022-01-13 ENCOUNTER — Other Ambulatory Visit: Payer: Self-pay

## 2022-01-13 DIAGNOSIS — R87612 Low grade squamous intraepithelial lesion on cytologic smear of cervix (LGSIL): Secondary | ICD-10-CM

## 2022-01-14 ENCOUNTER — Ambulatory Visit (INDEPENDENT_AMBULATORY_CARE_PROVIDER_SITE_OTHER): Payer: 59 | Admitting: Psychiatry

## 2022-01-14 DIAGNOSIS — F331 Major depressive disorder, recurrent, moderate: Secondary | ICD-10-CM

## 2022-01-14 NOTE — Progress Notes (Signed)
Virtual Visit via Video Note  I connected with [name] [name] on 01/14/22 at 4:11 PM EDT by a video enabled telemedicine application and verified that I am speaking with the correct person using two identifiers.  Location: Patient: Home Provider: [facility] [place] office    I discussed the limitations of evaluation and management by telemedicine and the availability of in person appointments. The patient expressed understanding and agreed to proceed.  I provided 49 minutes of non-face-to-face time during this encounter.   [name], LCSW        Session Time:  Thursday  01/14/2022 4:11 PM - 5:00 PM   Participation Level: Active  Behavioral Response: CasualAlert/depressed,   Type of Therapy: Individual Therapy  Treatment Goals addressed: Reduce frequency, intensity, and duration of depressive symptoms as evidenced by reducing.'s of depressive symptoms from 5 days/week to 3 days/week per patient's report, practice behavioral activation skills 2 times per week for the next 12 weeks  Progress on goals: Progressing  Interventions: CBT and Supportive    Summary: [name] [name] is a 56 y.o. female who is referred for sevices for continuity of care by therapist Hilbert Odor while therapist is on temporary leave. She denies any psychiatric hospitalizations. She has been in outpatient therapy for a year and currently sees psychiatrist [name] for medication management.  She reports needing help with stress management mainly with work issues.  Patient reports being under a lot of stress due to work demands and says it has implications for her diabetes management.  She reports tendency to internalize thoughts and feelings. She reports frequent worry about not getting information timely from coworkers to perform her job.  Current symptoms include difficulty concentrating, fatigue, thoughts/feelings of hopelessness, irritability, sleep difficulty, tearfulness, fatigue, and  excessive worry.      Patient last was seen via virtual visit about 2 months ago. Patient reports improved mood along with decreased stress and anxiety since last session.  She has been using thought log and reports improved ability to identify, challenge, and replace negative thoughts.  She reports experiencing less stress along with experiencing increased confidence and assertiveness skills.  She maintains involvement in activities.  She is still having some difficulty regarding pacing self and taking time for self-care at work. Suicidal/Homicidal: Nowithout intent/plan  Therapist Response: reviewed symptoms, praised and reinforced patient's efforts to thought log, reviewed thought log with patient, elaborated on distinction between thoughts and feelings, praised and reinforced patient's use of assertiveness skills, discussed effects, reiterated connection between thoughts/mood/behaviors, assisted patient identify compensatory strategies and the effects on her behavior at work, developed plan with patient to pace self during workday and take lunch break, developed plan with patient to continue using thought log  plan: Return in 2 weeks  Diagnosis: Axis I: MDD    Anxiety    Collaboration of Care: Psychiatrist AEB patient works with psychiatrist [name], clinician encouraged patient to contact psychiatrist [name] regarding medication concerns.  Patient/Guardian was advised Release of Information must be obtained prior to any record release in order to collaborate their care with an outside provider. Patient/Guardian was advised if they have not already done so to contact the registration department to sign all necessary forms in order for [place] to release information regarding their care.   Consent: Patient/Guardian gives verbal consent for treatment and assignment of benefits for services provided during this visit. Patient/Guardian expressed understanding and agreed to proceed.    [name], LCSW 01/14/2022

## 2022-01-22 ENCOUNTER — Other Ambulatory Visit (HOSPITAL_COMMUNITY): Payer: Self-pay | Admitting: Psychiatry

## 2022-01-22 DIAGNOSIS — F419 Anxiety disorder, unspecified: Secondary | ICD-10-CM

## 2022-01-29 ENCOUNTER — Telehealth (HOSPITAL_BASED_OUTPATIENT_CLINIC_OR_DEPARTMENT_OTHER): Payer: 59 | Admitting: Psychiatry

## 2022-01-29 ENCOUNTER — Encounter (HOSPITAL_COMMUNITY): Payer: Self-pay | Admitting: Psychiatry

## 2022-01-29 DIAGNOSIS — F419 Anxiety disorder, unspecified: Secondary | ICD-10-CM | POA: Diagnosis not present

## 2022-01-29 DIAGNOSIS — F331 Major depressive disorder, recurrent, moderate: Secondary | ICD-10-CM

## 2022-01-29 MED ORDER — ALPRAZOLAM 0.25 MG PO TABS: 0.2500 mg | ORAL_TABLET | Freq: Two times a day (BID) | ORAL | 0 refills | Status: DC | PRN

## 2022-01-29 MED ORDER — BUPROPION HCL ER (XL) 300 MG PO TB24: 300.0000 mg | ORAL_TABLET | ORAL | 0 refills | Status: DC

## 2022-01-29 MED ORDER — VENLAFAXINE HCL ER 37.5 MG PO CP24: ORAL_CAPSULE | ORAL | 0 refills | Status: DC

## 2022-01-29 NOTE — Progress Notes (Signed)
Virtual Visit via Telephone Note  I connected with [name] [name] on 01/29/22 at 10:20 AM EDT by telephone and verified that I am speaking with the correct person using two identifiers.  Location: Patient: Home Provider: Home Work   I discussed the limitations, risks, security and privacy concerns of performing an evaluation and management service by telephone and the availability of in person appointments. I also discussed with the patient that there may be a patient responsible charge related to this service. The patient expressed understanding and agreed to proceed.   History of Present Illness: Patient is evaluated by phone session.  On the last visit we recommended IOP as patient having a lot of stress, depression, anxiety and hopelessness.  She is feeling better and decided not to pursue with the IOP.  She reported her blood pressure is much better and only increased when she is at work.  She started therapy with Florencia Reasons and that is helping her a lot.  She started going out and that helps.  She also applied for a new position and she got a very positive feedback and she is very hopeful and that helps her a lot.  She is sleeping better.  She is taking Adderall 10 mg once a day.  She takes Xanax mostly on the weekdays and try to avoid taking on the weekends.  She feels overall her depression and anxiety is better.  She denies any crying spells or any paranoia.  She usually have a lot of negative thoughts but that has been subsided a lot.  Her energy level is improved.  She denies any mania, psychosis.  She has upcoming procedure for the uterus biopsy.  Last time she had a procedure and she had a panic attack and she had to call her friend to pick her up.  Her OB/GYN recommended to take Xanax before the procedure but she did not fill the prescription from OB/GYN because she had prescription from her house.  She is wondering if she can take before the procedure and I agree.  Her appetite is  okay.   Psychiatric Specialty Exam: Physical Exam  Review of Systems  Weight 146 lb (66.2 kg), last menstrual period 06/28/2000.There is no height or weight on file to calculate BMI.  General Appearance: NA  Eye Contact:  NA  Speech:  Slow  Volume:  Normal  Mood:  Euthymic  Affect:  NA  Thought Process:  Goal Directed  Orientation:  Full (Time, Place, and Person)  Thought Content:  Rumination  Suicidal Thoughts:  No  Homicidal Thoughts:  No  Memory:  Immediate;   Good Recent;   Good Remote;   Good  Judgement:  Intact  Insight:  Present  Psychomotor Activity:  NA  Concentration:  Concentration: Good and Attention Span: Good  Recall:  Good  Fund of Knowledge:  Good  Language:  Good  Akathisia:  No  Handed:  Right  AIMS (if indicated):     Assets:  Communication Skills Desire for Improvement Housing Transportation  ADL's:  Intact  Cognition:  WNL  Sleep:   better      Assessment and Plan: Major depressive disorder, recurrent.  Anxiety.  Patient doing better since started therapy with Florencia Reasons and also applied for a new position and very hopeful because she got a positive feedback.  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  She also cut down her Adderall and only taking 1 pill a day and she noticed  her blood pressure is better except sometimes she feels very tense at work.  She has a nurse who comes every 2-week at work and she has a plan to get her blood pressure next time.  She reported increased Xanax 0.25 mg to take 2 times a day had helped her a lot but she is not taking on the weekends.  She decided not to do IOP as things are much better.  She has no tremors, shakes or any EPS.  Like to keep the current medicine which is Effexor 112.5 mg daily, Wellbutrin 300 mg daily and Xanax 0.25 mg to take as needed twice a day.  She like to take extra Xanax before her procedure has in 2 weeks having endometrium biopsy.  I encourage check the blood pressure  regularly since she has been very higher readings but lately getting better.  I also encouraged to continue therapy with Florencia Reasons.  Patient like to send her prescription to the local pharmacy.  I recommend to call us back if she has any question, concern or if she feels worsening of the symptoms.  Discussed safety concerns at any time having active suicidal thoughts or homicidal halogen need to call 911 or go to local emergency room.  Patient like to have a follow-up in 3 months.  Follow Up Instructions:    I discussed the assessment and treatment plan with the patient. The patient was provided an opportunity to ask questions and all were answered. The patient agreed with the plan and demonstrated an understanding of the instructions.  Collaboration of Care: Primary Care Provider AEB notes are available in epic to review  Patient/Guardian was advised Release of Information must be obtained prior to any record release in order to collaborate their care with an outside provider. Patient/Guardian was advised if they have not already done so to contact the registration department to sign all necessary forms in order for [place] to release information regarding their care.   Consent: Patient/Guardian gives verbal consent for treatment and assignment of benefits for services provided during this visit. Patient/Guardian expressed understanding and agreed to proceed.     The patient was advised to call back or seek an in-person evaluation if the symptoms worsen or if the condition fails to improve as anticipated.  I provided 36 minutes of non-face-to-face time during this encounter.   Cleotis Nipper, MD

## 2022-02-01 ENCOUNTER — Other Ambulatory Visit: Payer: Self-pay | Admitting: Neurology

## 2022-02-01 DIAGNOSIS — G4733 Obstructive sleep apnea (adult) (pediatric): Secondary | ICD-10-CM

## 2022-02-01 DIAGNOSIS — G4711 Idiopathic hypersomnia with long sleep time: Secondary | ICD-10-CM

## 2022-02-01 MED ORDER — AMPHETAMINE-DEXTROAMPHETAMINE 10 MG PO TABS
10.0000 mg | ORAL_TABLET | Freq: Two times a day (BID) | ORAL | 0 refills | Status: DC
Start: 2022-02-01 — End: 2022-04-20

## 2022-02-01 NOTE — Telephone Encounter (Signed)
Last visit: 09/17/21 Next visit: 09/23/22 Per [place] registry, last filled #60/30 on 11/17/2021. Rx refill sent to Dr Frances Furbish.

## 2022-02-01 NOTE — Telephone Encounter (Signed)
Pt is calling and requesting a refill amphetamine-dextroamphetamine (ADDERALL) 10 MG tablet on.Pt would like prescription be sent to [facility] [phone] .

## 2022-02-01 NOTE — Addendum Note (Signed)
Addended by: Bertram Savin on: 02/01/2022 03:14 PM   Modules accepted: Orders

## 2022-02-02 NOTE — Progress Notes (Unsigned)
GYNECOLOGY  VISIT   HPI: 56 y.o.   Divorced  [place]  female   [id number] with Patient's last menstrual period was 06/28/2000.   here for colposcopy.   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   Prior pap 06/11/21 HGSIL and negative HR HPV.  Colpo 07/13/21 showed koilocytic atypia, consistent with LGSIL.   Hx TVH for CIN III.   Took 3 Xanax 0.25 mg prior to her appointment.  She was very nervous with her last colposcopy.   Glucose 232 now on her monitor.   GYNECOLOGIC HISTORY: Patient's last menstrual period was 06/28/2000. Contraception: Hyst  Menopausal hormone therapy: Premarin Last mammogram: 07-23-19 Diag.Lt.w/Lt.US//stable probably benign mass/Diag.Lt.w/US in 1 year/BiRads3 Last pap smear: 01/04/2022-LGSIL, HPV- neg        OB History     Gravida  0   Para  0   Term  0   Preterm  0   AB  0   Living  0      SAB  0   IAB  0   Ectopic  0   Multiple  0   Live Births                 Patient Active Problem List   Diagnosis Date Noted   Abdominal bloating 12/02/2020   Change in bowel habit 12/02/2020   Chronic idiopathic constipation 12/02/2020   Family history of colonic polyps 12/02/2020   Flatulence, eructation and gas pain 12/02/2020   Incontinence of feces 12/02/2020   Irritable bowel syndrome 12/02/2020   Rectal bleeding 12/02/2020   Confusion 11/28/2017   Dizziness 11/28/2017   Insulin pump in place 09/22/2017   Cellulitis 09/21/2017   Diabetic ketoacidosis without coma associated with diabetes mellitus due to underlying condition (HCC) 09/21/2017   AKI (acute kidney injury) (HCC) 09/20/2017   Hyperglycemia 09/20/2017   Hypotension 09/20/2017   Sepsis (HCC) 09/20/2017   Intermittent confusion 04/18/2017   OSA (obstructive sleep apnea) 04/18/2017   HYPOTHYROIDISM 09/21/2007   DIABETES MELLITUS, TYPE I 09/21/2007   ANXIETY DEPRESSION 09/21/2007   NAUSEA 09/21/2007    Past Medical History:  Diagnosis Date    Breast cyst    left   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   Diabetes mellitus    Dry eyes, bilateral 2018   Fall at home 05/2018   injured low back/spine/sciatic nerve--had 2 spinal injections   Ketoacidosis 6160,7371   Melasma 01/2020   Meningitis 06/2001   --hospital   Rheumatoid arthritis (HCC) 2014   Sleep apnea 2013   Sleep apnea 2011   STD (sexually transmitted disease) 6/09   HSV II   Stress    Thyroid disease    hypothyroidism   VAIN I (vaginal intraepithelial neoplasia grade I) 2013    Past Surgical History:  Procedure Laterality Date   ABDOMINAL HYSTERECTOMY     2004   CERVICAL BIOPSY  W/ LOOP ELECTRODE EXCISION  01-24-95   CIN I   COLPOSCOPY  11-21-02   CIN III   COLPOSCOPY  05-05-12   no lesions--needs repeat pap 08/2012 per Dr. Tresa Res   HAND SURGERY  2004   tore tendon   positive HPV  03/21/15   normal pap and negative types 16/18   SOFT TISSUE CYST EXCISION  12/2010   left breast epidermoid inclusion cyst   SPINE SURGERY  05/21/14   TONSILLECTOMY AND ADENOIDECTOMY      Current Outpatient Medications  Medication Sig Dispense  Refill   ALPRAZolam (XANAX) 0.25 MG tablet Take 1 tablet (0.25 mg total) by mouth 2 (two) times daily as needed for anxiety. 60 tablet 0   amphetamine-dextroamphetamine (ADDERALL) 10 MG tablet Take 1 tablet (10 mg total) by mouth 2 (two) times daily. 60 tablet 0   Ascorbic Acid (VITAMIN C) 1000 MG tablet Take 1,000 mg by mouth daily.     BD PEN NEEDLE NANO 2ND GEN 32G X 4 MM MISC USE TO ADMINISTER INSULIN 4 TIMES DAILY IF NOT ON INSULIN PUMP     Biotin 10 MG CAPS Take 1 capsule by mouth daily.     buPROPion (WELLBUTRIN XL) 300 MG 24 hr tablet Take 1 tablet (300 mg total) by mouth every morning. 90 tablet 0   Continuous Blood Gluc Sensor (DEXCOM G6 SENSOR) MISC CHANGE EVERY 10 DAYS TO MONITOR BLOOD GLUCOSE CONTINUOUSLY     Continuous Blood Gluc Transmit (DEXCOM G6 TRANSMITTER) MISC CHANGE EVERY 3 MONTHS TO MONITOR BLOOD  GLUCOSE CONTINUOUSLY     estrogens, conjugated, (PREMARIN) 0.625 MG tablet 1 tablet Orally Once a day     gabapentin (NEURONTIN) 100 MG capsule Take 1 capsule (100 mg total) by mouth 2 (two) times daily as needed (anxiety). 30 capsule 0   Insulin Human (INSULIN PUMP) SOLN Inject 25 each into the skin as directed. Throughout the day (Per BS)     Insulin Lispro w/ Trans Port 100 UNIT/ML SOPN INJECT AS DIRECTED 3 TIMES A DAY (USE A MAX TOTAL DAILY DOSE OF 30 UNITS) IF INSULIN PUMP FAILURE     Insulin Pen Needle (BD PEN NEEDLE NANO U/F) 32G X 4 MM MISC Use to administer insulin 4 times daily if not on insulin pump     levothyroxine (SYNTHROID) 137 MCG tablet take one tablet but mouth daily, but skip Sunday     Multiple Vitamins-Minerals (QC WOMENS DAILY MULTIVITAMIN) TABS Take one tablet once daily Oral     venlafaxine XR (EFFEXOR XR) 37.5 MG 24 hr capsule Take three capsule daily 270 capsule 0   Vitamin D, Ergocalciferol, (DRISDOL) 1.25 MG (50000 UNIT) CAPS capsule Take 50,000 Units by mouth once a week.     Vitamin E 400 units TABS Take 1 capsule by mouth daily.     XIIDRA 5 % SOLN Apply to eye 2 (two) times daily.     No current facility-administered medications for this visit.     ALLERGIES: Sulfa antibiotics, Bee venom, Penicillin v potassium, and Lamotrigine  Family History  Problem Relation Age of Onset   Other Mother        cysts   Diabetes Father    Kidney disease Father    Stroke Paternal Aunt    Breast cancer Neg Hx     Social History   Socioeconomic History   Marital status: Divorced    Spouse name: Not on file   Number of children: 0   Years of education: undergrad   Highest education level: Not on file  Occupational History   Occupation: SAP PDM ANALYST    Employer: [employer]    Employer: [employer]  Tobacco Use   Smoking status: Never   Smokeless tobacco: Never  Vaping Use   Vaping Use: Never used  Substance and Sexual Activity   Alcohol use:  No    Alcohol/week: 0.0 standard drinks of alcohol   Drug use: No   Sexual activity: Yes    Partners: Male    Birth control/protection: Surgical, Condom  Comment: TVH  Other Topics Concern   Not on file  Social History Narrative   Consumes 120mg  caffeine daily,left handed   Social Determinants of Health   Financial Resource Strain: Not on file  Food Insecurity: Not on file  Transportation Needs: Not on file  Physical Activity: Not on file  Stress: Not on file  Social Connections: Not on file  Intimate Partner Violence: Not on file    Review of Systems  All other systems reviewed and are negative.   PHYSICAL EXAMINATION:    BP 116/60   Pulse 81   LMP 06/28/2000   SpO2 98%     General appearance: alert, cooperative and appears stated age.  Sleeping upon entry to the room.   Colposcopy procedure.  Consent previously signed.  Colpo with acetic acid and then Lugols.  No lesions of vagina.  Random biopsy of vaginal cuff to path. Monsel's placed.  No complications.  Minimal EBL.  Chaperone was present for exam:  08/26/2000, CMA  ASSESSMENT  LGSIL, possible HGSIL and neg HR HPV.  Hx CIN III.  Status post hysterectomy.   PLAN  Fu biopsy.  Final plan to follow. Patient escorted in wheel chair by nurse to car of her partner.   Her partner is her driver and will take care of her at home.   An After Visit Summary was printed and given to the patient.

## 2022-02-04 ENCOUNTER — Telehealth (INDEPENDENT_AMBULATORY_CARE_PROVIDER_SITE_OTHER): Payer: 59 | Admitting: Psychiatry

## 2022-02-04 DIAGNOSIS — F331 Major depressive disorder, recurrent, moderate: Secondary | ICD-10-CM | POA: Diagnosis not present

## 2022-02-04 NOTE — Progress Notes (Signed)
Virtual Visit via Video Note  I connected with [name] [name] on 02/04/22 at 11:20 EDT  by a video enabled telemedicine application and verified that I am speaking with the correct person using two identifiers.  Location: Patient: Home Provider: [facility] Outpatient [place] office    I discussed the limitations of evaluation and management by telemedicine and the availability of in person appointments. The patient expressed understanding and agreed to proceed.  I provided 40 minutes of non-face-to-face time during this encounter.   [name], LCSW        Session Time:  Thursday 02/04/2022 11:20 AM - 12:00 PM  Participation Level: Active  Behavioral Response: CasualAlert/depressed,   Type of Therapy: Individual Therapy  Treatment Goals addressed: Reduce frequency, intensity, and duration of depressive symptoms as evidenced by reducing.'s of depressive symptoms from 5 days/week to 3 days/week per patient's report, practice behavioral activation skills 2 times per week for the next 12 weeks  Progress on goals: Progressing  Interventions: CBT and Supportive    Summary: [name] [name] is a 56 y.o. female who is referred for sevices for continuity of care by therapist Hilbert Odor while therapist is on temporary leave. She denies any psychiatric hospitalizations. She has been in outpatient therapy for a year and currently sees psychiatrist [name] for medication management.  She reports needing help with stress management mainly with work issues.  Patient reports being under a lot of stress due to work demands and says it has implications for her diabetes management.  She reports tendency to internalize thoughts and feelings. She reports frequent worry about not getting information timely from coworkers to perform her job.  Current symptoms include difficulty concentrating, fatigue, thoughts/feelings of hopelessness, irritability, sleep difficulty, tearfulness, fatigue, and  excessive worry.      Patient last was seen via virtual visit about 3 weks ago. Patient reports continued improved mood along with decreased stress and anxiety since last session.  She has been more aware of her compensatory strategies and has intervened successfully to disrupt negative thought patterns as well as unhelpful behaviors.  Patient states feeling much better.  She has more realistic expectations of herself as well as others.  She reports increased self-confidence.  She recently applied for 2 jobs and is very optimistic about this.  She is pacing self and taking time for self-care at work.  She maintains involvement in activities and socialization outside of work. She reports health has improved especially regarding her blood pressure while at work.  She also reports decreased sleep difficulty and reports now being able to  fall asleep easily at night. Suicidal/Homicidal: Nowithout intent/plan  Therapist Response: reviewed symptoms, praised and reinforced patient's increased awareness of her use of compensatory strategies/efforts to disrupt negative thought patterns as well as unhelpful behaviors, discussed effects, praised and reinforced patient's initiative and applying for jobs, discussed effects on her mood and behavior as well as thoughts, praised and reinforced patient's efforts to pace self during the workday, encouraged patient to maintain consistent efforts efforts    plan: Return in 2 weeks  Diagnosis: Axis I: MDD    Anxiety    Collaboration of Care: Psychiatrist AEB patient works with psychiatrist [name], clinician encouraged patient to contact psychiatrist [name] regarding medication concerns.  Patient/Guardian was advised Release of Information must be obtained prior to any record release in order to collaborate their care with an outside provider. Patient/Guardian was advised if they have not already done so to contact the registration department  to sign all necessary  forms in order for [place] to release information regarding their care.   Consent: Patient/Guardian gives verbal consent for treatment and assignment of benefits for services provided during this visit. Patient/Guardian expressed understanding and agreed to proceed.    [name], LCSW 02/04/2022

## 2022-02-12 ENCOUNTER — Encounter: Payer: Self-pay | Admitting: Obstetrics and Gynecology

## 2022-02-12 ENCOUNTER — Ambulatory Visit: Payer: Managed Care, Other (non HMO) | Admitting: Obstetrics and Gynecology

## 2022-02-12 ENCOUNTER — Other Ambulatory Visit (HOSPITAL_COMMUNITY)
Admission: RE | Admit: 2022-02-12 | Discharge: 2022-02-12 | Disposition: A | Discharge status: Home / Self Care | Payer: Managed Care, Other (non HMO) | Source: Ambulatory Visit | Attending: Obstetrics and Gynecology | Admitting: Obstetrics and Gynecology

## 2022-02-12 DIAGNOSIS — R87612 Low grade squamous intraepithelial lesion on cytologic smear of cervix (LGSIL): Secondary | ICD-10-CM

## 2022-02-12 NOTE — Patient Instructions (Signed)
Colposcopy, Care After  The following information offers guidance on how to care for yourself after your procedure. Your doctor may also give you more specific instructions. If you have problems or questions, contact your doctor. What can I expect after the procedure? If you did not have a sample of your tissue taken out (did not have a biopsy), you may only have some spotting of blood for a few days. You can go back to your normal activities. If you had a sample of your tissue taken out, it is common to have: Soreness and mild pain. These may last for a few days. Mild bleeding or fluid (discharge) coming from your vagina. The fluid will look dark and grainy. You may have this for a few days. The fluid may be caused by a liquid that was used during your procedure. You may need to wear a sanitary pad. Spotting of blood for at least 48 hours after the procedure. Follow these instructions at home: Medicines Take over-the-counter and prescription medicines only as told by your doctor. Ask your doctor what over-the-counter pain medicines and prescription medicines you can start taking again. This is very important if you take blood thinners. Activity For at least 3 days, or for as long as told by your doctor, avoid: Douching. Using tampons. Having sex. Return to your normal activities as told by your doctor. Ask your doctor what activities are safe for you. General instructions Ask your doctor if you may take baths, swim, or use a hot tub. You may take showers. If you use birth control (contraception), keep using it. Keep all follow-up visits. Contact a doctor if: You have a fever or chills. You faint or feel light-headed. Get help right away if: You bleed a lot from your vagina. A lot of bleeding means that the bleeding soaks through a pad in less than 1 hour. You have clumps of blood (blood clots) coming from your vagina. You have signs that could mean you have an infection. This may be  fluid coming from your vagina that is: Different than normal. Yellow. Bad-smelling. You have very bad pain or cramps in your lower belly that do not get better with medicine. Summary If you did not have a sample of your tissue taken out, you may only have some spotting of blood for a few days. You can go back to your normal activities. If you had a sample of your tissue taken out, it is common to have mild pain for a few days and spotting for 48 hours. Avoid douching, using tampons, and having sex for at least 3 days after the procedure or for as long as told. Get help right away if you have a lot of bleeding, very bad pain, or signs of infection. This information is not intended to replace advice given to you by your health care provider. Make sure you discuss any questions you have with your health care provider. Document Revised: 11/09/2020 Document Reviewed: 11/09/2020 Elsevier Patient Education  2023 Elsevier Inc.  

## 2022-02-15 LAB — SURGICAL PATHOLOGY

## 2022-02-18 ENCOUNTER — Other Ambulatory Visit: Payer: Self-pay | Admitting: Obstetrics and Gynecology

## 2022-02-18 ENCOUNTER — Telehealth (INDEPENDENT_AMBULATORY_CARE_PROVIDER_SITE_OTHER): Payer: 59 | Admitting: Psychiatry

## 2022-02-18 DIAGNOSIS — N63 Unspecified lump in unspecified breast: Secondary | ICD-10-CM

## 2022-02-18 DIAGNOSIS — F331 Major depressive disorder, recurrent, moderate: Secondary | ICD-10-CM

## 2022-02-18 NOTE — Progress Notes (Signed)
Virtual Visit via Video Note  I connected with [name] [name] on 02/18/22 at 3:05 PM EDT  by a video enabled telemedicine application and verified that I am speaking with the correct person using two identifiers.  Location: Patient: Home Provider: [facility] Outpatient [place] office    I discussed the limitations of evaluation and management by telemedicine and the availability of in person appointments. The patient expressed understanding and agreed to proceed.   I provided 49 minutes of non-face-to-face time during this encounter.   [name], LCSW         Session Time:  Thursday 02/18/2022 3:05 PM - 3:54 PM   Participation Level: Active  Behavioral Response: CasualAlert/euthymic  Type of Therapy: Individual Therapy  Treatment Goals addressed: Reduce frequency, intensity, and duration of depressive symptoms as evidenced by reducing.'s of depressive symptoms from 5 days/week to 3 days/week per patient's report, practice behavioral activation skills 2 times per week for the next 12 weeks  Progress on goals: Progressing  Interventions: CBT and Supportive    Summary: [name] [name] is a 56 y.o. female who is referred for sevices for continuity of care by therapist Hilbert Odor while therapist is on temporary leave. She denies any psychiatric hospitalizations. She has been in outpatient therapy for a year and currently sees psychiatrist [name] for medication management.  She reports needing help with stress management mainly with work issues.  Patient reports being under a lot of stress due to work demands and says it has implications for her diabetes management.  She reports tendency to internalize thoughts and feelings. She reports frequent worry about not getting information timely from coworkers to perform her job.  Current symptoms include difficulty concentrating, fatigue, thoughts/feelings of hopelessness, irritability, sleep difficulty, tearfulness, fatigue, and  excessive worry.      Patient last was seen via virtual visit about 2 weeks ago. Patient reports continued improved mood along with decreased stress and anxiety since last session.  She remains more aware of her compensatory strategies and has intervened successfully to disrupt negative thought patterns as well as unhelpful behaviors.  Patient reports being able to enjoy better quality of life not that she is able to let things go and relax. She reports increased self-acceptance and decreased perfectionistic tendencies.  She maintains involvement in activities and socialization outside of work.  Suicidal/Homicidal: Nowithout intent/plan  Therapist Response: reviewed symptoms, praised and reinforced patient's continued  increased awareness of her use of compensatory strategies/efforts to disrupt negative thought patterns as well as unhelpful behaviors, elaborated more on how to replace should and ought thoughts with more realistic thoughts and effects on thinking process, discussed rationale for and assisted patient practice body scan meditation as a relaxation technique to continue to reduce stress and anxiety, developed plan with patient to practice between sessions, also was seen patient access code to interactive audio activity   plan: Return in 2 weeks  Diagnosis: Axis I: MDD    Anxiety    Collaboration of Care: Psychiatrist AEB patient works with psychiatrist [name], clinician encouraged patient to contact psychiatrist [name] regarding medication concerns.  Patient/Guardian was advised Release of Information must be obtained prior to any record release in order to collaborate their care with an outside provider. Patient/Guardian was advised if they have not already done so to contact the registration department to sign all necessary forms in order for [place] to release information regarding their care.   Consent: Patient/Guardian gives verbal consent for treatment and assignment of benefits  for services provided during this visit. Patient/Guardian expressed understanding and agreed to proceed.    [name], LCSW 02/18/2022

## 2022-03-04 ENCOUNTER — Telehealth (INDEPENDENT_AMBULATORY_CARE_PROVIDER_SITE_OTHER): Payer: 59 | Admitting: Psychiatry

## 2022-03-04 ENCOUNTER — Ambulatory Visit (HOSPITAL_COMMUNITY): Payer: 59 | Admitting: Psychiatry

## 2022-03-04 DIAGNOSIS — F331 Major depressive disorder, recurrent, moderate: Secondary | ICD-10-CM

## 2022-03-04 NOTE — Progress Notes (Signed)
Virtual Visit via Video Note  I connected with [name] [name] on 03/04/22 at 3:14 PM EDT  by a video enabled telemedicine application and verified that I am speaking with the correct person using two identifiers.  Location: Patient: Home Provider: [facility] [place] [facility]   I discussed the limitations of evaluation and management by telemedicine and the availability of in person appointments. The patient expressed understanding and agreed to proceed.  I provided 45 minutes of non-face-to-face time during this encounter.   [name], LCSW          Session Time:  Thursday 03/04/2022 3:14 PM - 3:59 PM   Participation Level: Active  Behavioral Response: CasualAlert/euthymic  Type of Therapy: Individual Therapy  Treatment Goals addressed: Reduce frequency, intensity, and duration of depressive symptoms as evidenced by reducing.'s of depressive symptoms from 5 days/week to 3 days/week per patient's report, practice behavioral activation skills 2 times per week for the next 12 weeks  Progress on goals: Progressing  Interventions: CBT and Supportive    Summary: [name] [name] is a 56 y.o. female who is referred for sevices for continuity of care by therapist Hilbert Odor while therapist is on temporary leave. She denies any psychiatric hospitalizations. She has been in outpatient therapy for a year and currently sees psychiatrist [name] for medication management.  She reports needing help with stress management mainly with work issues.  Patient reports being under a lot of stress due to work demands and says it has implications for her diabetes management.  She reports tendency to internalize thoughts and feelings. She reports frequent worry about not getting information timely from coworkers to perform her job.  Current symptoms include difficulty concentrating, fatigue, thoughts/feelings of hopelessness, irritability, sleep difficulty, tearfulness, fatigue, and  excessive worry.      Patient last was seen via virtual visit about 2 weeks ago. Patient reports continued improved mood along with decreased stress and anxiety since last session.  She reports practicing body scan meditation regularly and says this has been very helpful.  Per her report, she has become much more aware of stress/muscle tension and has been able to use body scan to release tension.  She expresses increased confidence in her ability to cope with stress.  She states enjoying life more and now having more balance.  She continues to look for a job but no longer is experiencing overwhelming negative thoughts and sadness in her current job.  She has improved self-care, she maintains involvement in activities as well as socialization with friends.  She continues to report increased self-acceptance and decreased perfectionistic tendencies.  Suicidal/Homicidal: Nowithout intent/plan  Therapist Response: reviewed symptoms, praised and reinforced patient's efforts to practice body scan meditation, discussed effects, praised and reinforced patient's increased self-awareness and efforts to maintain balance, encouraged patient to maintain consistent efforts between sessions plan: Return in 2 weeks  Diagnosis: Axis I: MDD    Anxiety    Collaboration of Care: Psychiatrist AEB patient works with psychiatrist [name], clinician encouraged patient to contact psychiatrist [name] regarding medication concerns.  Patient/Guardian was advised Release of Information must be obtained prior to any record release in order to collaborate their care with an outside provider. Patient/Guardian was advised if they have not already done so to contact the registration department to sign all necessary forms in order for [place] to release information regarding their care.   Consent: Patient/Guardian gives verbal consent for treatment and assignment of benefits for services provided during this visit. Patient/Guardian  expressed understanding and agreed to proceed.    [name], LCSW 03/04/2022

## 2022-03-18 ENCOUNTER — Ambulatory Visit (INDEPENDENT_AMBULATORY_CARE_PROVIDER_SITE_OTHER): Payer: 59 | Admitting: Psychiatry

## 2022-03-18 DIAGNOSIS — F331 Major depressive disorder, recurrent, moderate: Secondary | ICD-10-CM

## 2022-03-18 NOTE — Progress Notes (Signed)
Virtual Visit via Video Note  I connected with [name] [name] on 03/18/22 at 2:07 PM EDT  by a video enabled telemedicine application and verified that I am speaking with the correct person using two identifiers.  Location: Patient: Home Provider: [facility] [place] office    I discussed the limitations of evaluation and management by telemedicine and the availability of in person appointments. The patient expressed understanding and agreed to proceed.   I provided 55 minutes of non-face-to-face time during this encounter.   [name], LCSW          Session Time:  Thursday 03/18/2022 2:07 PM - 3:02 PM   Participation Level: Active  Behavioral Response: CasualAlert/euthymic  Type of Therapy: Individual Therapy  Treatment Goals addressed: Reduce frequency, intensity, and duration of depressive symptoms as evidenced by reducing.'s of depressive symptoms from 5 days/week to 3 days/week per patient's report, practice behavioral activation skills 2 times per week for the next 12 weeks  Progress on goals: Progressing  Interventions: CBT and Supportive    Summary: [name] [name] is a 56 y.o. female who is referred for sevices for continuity of care by therapist Hilbert Odor while therapist is on temporary leave. She denies any psychiatric hospitalizations. She has been in outpatient therapy for a year and currently sees psychiatrist [name] for medication management.  She reports needing help with stress management mainly with work issues.  Patient reports being under a lot of stress due to work demands and says it has implications for her diabetes management.  She reports tendency to internalize thoughts and feelings. She reports frequent worry about not getting information timely from coworkers to perform her job.  Current symptoms include difficulty concentrating, fatigue, thoughts/feelings of hopelessness, irritability, sleep difficulty, tearfulness, fatigue, and  excessive worry.      Patient last was seen via virtual visit about 2 weeks ago. Patient reports continued improved mood, continued behavioral activation, and continued positive self-care since last session.  She continues to practice body scan meditation regularly.  She successfully is able to identify/challenge/and replace negative thoughts especially regarding the workplace.  She reports maintaining balance.  She does report having some anxiety especially at night just prior to going to bed on the nights before she has to commute to work the following day.  She reports anxiety and what if thoughts regarding the commute due to heavy traffic.     Suicidal/Homicidal: Nowithout intent/plan  Therapist Response: reviewed symptoms, praised and reinforced patient's continued behavioral activation/use of body scan meditation/increased awareness and efforts to identify/challenge/replace negative thoughts, discussed stressors related to her commute and effects on her sleep pattern, facilitated expression of thoughts and feelings, validated feelings, assisted patient examine her thought patterns and discussed the difference between possibility and probability of fears actually happening, assisted patient challenge and replace catastrophizing thoughts, also assisted patient identify realistic expectations of self and others regarding driving, developed plan with patient to implement bedtime ritual discussed in session, discussed rationale for and assisted patient practice progressive muscle relaxation, develop plan with patient to practice PMR as part of bedtime ritual, checked out interactive audio activity to patient and provided with access code. plan: Return in 2 weeks  Diagnosis: Axis I: MDD    Anxiety    Collaboration of Care: Psychiatrist AEB patient works with psychiatrist [name], clinician encouraged patient to contact psychiatrist [name] regarding medication concerns.  Patient/Guardian was  advised Release of Information must be obtained prior to any record release in order to  collaborate their care with an outside provider. Patient/Guardian was advised if they have not already done so to contact the registration department to sign all necessary forms in order for [place] to release information regarding their care.   Consent: Patient/Guardian gives verbal consent for treatment and assignment of benefits for services provided during this visit. Patient/Guardian expressed understanding and agreed to proceed.    Alonza Smoker, LCSW 03/18/2022

## 2022-03-22 ENCOUNTER — Other Ambulatory Visit (HOSPITAL_COMMUNITY): Payer: Self-pay | Admitting: Psychiatry

## 2022-03-22 DIAGNOSIS — F331 Major depressive disorder, recurrent, moderate: Secondary | ICD-10-CM

## 2022-03-22 DIAGNOSIS — F419 Anxiety disorder, unspecified: Secondary | ICD-10-CM

## 2022-03-24 ENCOUNTER — Telehealth (HOSPITAL_COMMUNITY): Payer: Self-pay | Admitting: *Deleted

## 2022-03-24 DIAGNOSIS — F419 Anxiety disorder, unspecified: Secondary | ICD-10-CM

## 2022-03-24 MED ORDER — ALPRAZOLAM 0.25 MG PO TABS
0.2500 mg | ORAL_TABLET | Freq: Two times a day (BID) | ORAL | 0 refills | Status: DC | PRN
Start: 2022-03-24 — End: 2022-06-17

## 2022-03-24 NOTE — Telephone Encounter (Signed)
[facility] to [facility]. 

## 2022-03-24 NOTE — Telephone Encounter (Signed)
Pt called requesting a refill of the Xanax 0.25 mg bid prn. #60 sent on 01/29/22, last visit. Pt has an upcoming appointment on 04/30/22. Please review.

## 2022-04-01 ENCOUNTER — Ambulatory Visit (HOSPITAL_COMMUNITY): Payer: 59 | Admitting: Psychiatry

## 2022-04-02 ENCOUNTER — Other Ambulatory Visit: Payer: Managed Care, Other (non HMO)

## 2022-04-15 ENCOUNTER — Ambulatory Visit (INDEPENDENT_AMBULATORY_CARE_PROVIDER_SITE_OTHER): Payer: 59 | Admitting: Psychiatry

## 2022-04-15 DIAGNOSIS — F331 Major depressive disorder, recurrent, moderate: Secondary | ICD-10-CM | POA: Diagnosis not present

## 2022-04-15 NOTE — Progress Notes (Signed)
Virtual Visit via Video Note  I connected with Jackquline Berlin on 04/15/22 at 3;12 PM EDT  by a video enabled telemedicine application and verified that I am speaking with the correct person using two identifiers.  Location: Patient: Home Provider: [place] office    I discussed the limitations of evaluation and management by telemedicine and the availability of in person appointments. The patient expressed understanding and agreed to proceed.  I provided 48 minutes of non-face-to-face time during this encounter.   Alonza Smoker, LCSW         Session Time:  Thursday 04/15/2022 3:12 PM - 4:00 PM   Participation Level: Active  Behavioral Response: CasualAlert/euthymic  Type of Therapy: Individual Therapy  Treatment Goals addressed: Reduce frequency, intensity, and duration of depressive symptoms as evidenced by reducing.'s of depressive symptoms from 5 days/week to 3 days/week per patient's report, practice behavioral activation skills 2 times per week for the next 12 weeks  Progress on goals: Progressing  Interventions: CBT and Supportive    Summary: Shallen Luedke is a 56 y.o. female who is referred for sevices for continuity of care by therapist Lise Auer while therapist is on temporary leave. She denies any psychiatric hospitalizations. She has been in outpatient therapy for a year and currently sees psychiatrist Dr. [name] for medication management.  She reports needing help with stress management mainly with work issues.  Patient reports being under a lot of stress due to work demands and says it has implications for her diabetes management.  She reports tendency to internalize thoughts and feelings. She reports frequent worry about not getting information timely from coworkers to perform her job.  Current symptoms include difficulty concentrating, fatigue, thoughts/feelings of hopelessness, irritability, sleep difficulty, tearfulness, fatigue, and  excessive worry.      Patient last was seen via virtual visit about 4 weeks ago. Patient reports continued improved mood, continued behavioral activation, and continued positive self-care since last session.  She reports difficulty implementing bedtime ritual on some evenings.  She also states having difficulty winding down during those evenings.  She expresses anxiety and frustration with self as she has not implemented the plan developed in last session the way she thinks she should have.  She continues to report significant tension and anxiety about her commute to work.     Suicidal/Homicidal: Nowithout intent/plan  Therapist Response: reviewed symptoms, praised and reinforced patient's efforts to try to implement bedtime ritual, assisted patient examine thoughts and processes that affect her ability to implement ritual, assisted patient identify/challenge/and replace should and ought thoughts, assisted patient identify realistic expectations of self regarding wind down time in the evenings on the day she commutes to work, developed plan with patient to use replacement statement, reviewed the threat response system and the effects on the body, reiterated use of body scan meditation, developed plan with patient to do a 15 to 20-minute brisk walk when she arrives home from work to decrease stress   plan: Return in 2 weeks  Diagnosis: Axis I: MDD    Anxiety    Collaboration of Care: Psychiatrist AEB patient works with psychiatrist Dr. [name], clinician encouraged patient to contact psychiatrist Dr. [name] regarding medication concerns.  Patient/Guardian was advised Release of Information must be obtained prior to any record release in order to collaborate their care with an outside provider. Patient/Guardian was advised if they have not already done so to contact the registration department to sign all necessary forms in order for [place] to release  information regarding their care.   Consent:  Patient/Guardian gives verbal consent for treatment and assignment of benefits for services provided during this visit. Patient/Guardian expressed understanding and agreed to proceed.    [name], LCSW 04/15/2022

## 2022-04-18 ENCOUNTER — Other Ambulatory Visit (HOSPITAL_COMMUNITY): Payer: Self-pay | Admitting: Psychiatry

## 2022-04-18 DIAGNOSIS — F419 Anxiety disorder, unspecified: Secondary | ICD-10-CM

## 2022-04-20 ENCOUNTER — Other Ambulatory Visit: Payer: Self-pay | Admitting: Neurology

## 2022-04-20 DIAGNOSIS — G4711 Idiopathic hypersomnia with long sleep time: Secondary | ICD-10-CM

## 2022-04-20 DIAGNOSIS — G4733 Obstructive sleep apnea (adult) (pediatric): Secondary | ICD-10-CM

## 2022-04-20 MED ORDER — AMPHETAMINE-DEXTROAMPHETAMINE 10 MG PO TABS: 10.0000 mg | ORAL_TABLET | Freq: Two times a day (BID) | ORAL | 0 refills | Status: DC

## 2022-04-20 NOTE — Telephone Encounter (Signed)
Last seen 09-17-2021, next appt is a year 08-2022 scheduled.

## 2022-04-20 NOTE — Telephone Encounter (Signed)
Pt called needing a refill request for her amphetamine-dextroamphetamine (ADDERALL) 10 MG tablet [facility] [address]

## 2022-04-26 ENCOUNTER — Encounter: Payer: Self-pay | Admitting: Nurse Practitioner

## 2022-04-26 ENCOUNTER — Ambulatory Visit: Payer: Managed Care, Other (non HMO) | Admitting: Nurse Practitioner

## 2022-04-26 VITALS — BP 102/60 | HR 109

## 2022-04-26 DIAGNOSIS — R300 Dysuria: Secondary | ICD-10-CM

## 2022-04-26 DIAGNOSIS — L292 Pruritus vulvae: Secondary | ICD-10-CM | POA: Diagnosis not present

## 2022-04-26 DIAGNOSIS — B3731 Acute candidiasis of vulva and vagina: Secondary | ICD-10-CM | POA: Diagnosis not present

## 2022-04-26 DIAGNOSIS — N760 Acute vaginitis: Secondary | ICD-10-CM

## 2022-04-26 LAB — WET PREP FOR TRICH, YEAST, CLUE

## 2022-04-26 MED ORDER — METRONIDAZOLE 500 MG PO TABS: 500.0000 mg | ORAL_TABLET | Freq: Two times a day (BID) | ORAL | 0 refills | Status: DC

## 2022-04-26 MED ORDER — FLUCONAZOLE 150 MG PO TABS: 150.0000 mg | ORAL_TABLET | ORAL | 0 refills | Status: DC

## 2022-04-26 NOTE — Progress Notes (Signed)
   Acute Office Visit  Subjective:    Patient ID: [name] [name], female    DOB: [birth year], 56 y.o.   MRN: [id number]   HPI 56 y.o. presents today for burning with urination, urgency, and odor. Also complains of vulvar itching/irritation. Symptoms began ~5 days ago.    Review of Systems  Constitutional: Negative.   Genitourinary:  Positive for dysuria, urgency, vaginal discharge and vaginal pain (Vulvar itching/irritation). Negative for difficulty urinating, flank pain, frequency and hematuria.       Odor       Objective:    Physical Exam Constitutional:      Appearance: Normal appearance.  Abdominal:     Tenderness: There is no right CVA tenderness or left CVA tenderness.  Genitourinary:    General: Normal vulva.     Vagina: Vaginal discharge present. No erythema.     BP 102/60   Pulse (!) 109   LMP 06/28/2000   SpO2 99%  Wt Readings from Last 3 Encounters:  01/04/22 146 lb (66.2 kg)  09/17/21 144 lb 9.6 oz (65.6 kg)  07/10/21 146 lb (66.2 kg)        Patient informed chaperone available to be present for breast and/or pelvic exam. Patient has requested no chaperone to be present. Patient has been advised what will be completed during breast and pelvic exam.   Wet prep + yeast, + clue cells (+ odor) UA 1+ leukocytes, nitrite negative, 2+ blood, 2+ protein, 2+ glucose, 2+ bilirubin, yellow/cloudy. Microscopic: wbc >60, rbc 3-10, moderate bacteria  Assessment & Plan:   Problem List Items Addressed This Visit   None Visit Diagnoses     Vulvovaginal candidiasis    -  Primary   Relevant Medications   fluconazole (DIFLUCAN) 150 MG tablet   metroNIDAZOLE (FLAGYL) 500 MG tablet   Vulvar itching       Relevant Orders   WET PREP FOR TRICH, YEAST, CLUE (Completed)   Bacterial vaginosis       Relevant Medications   fluconazole (DIFLUCAN) 150 MG tablet   metroNIDAZOLE (FLAGYL) 500 MG tablet   Burning with urination       Relevant Medications   fluconazole  (DIFLUCAN) 150 MG tablet   Other Relevant Orders   Urinalysis,Complete w/RFL Culture (Completed)      Plan: Wet prep positive for yeast and clue cells - Diflucan 150 mg today and repeat in 3 days. Flagyl 500 mg BID x 7 days. Leukocytosis in urinalysis. Could be from vaginal infections, will wait on culture to treat.      Tamela Gammon DNP, 10:19 AM 04/26/2022

## 2022-04-29 ENCOUNTER — Other Ambulatory Visit: Payer: Self-pay | Admitting: *Deleted

## 2022-04-29 ENCOUNTER — Ambulatory Visit (HOSPITAL_COMMUNITY): Payer: 59 | Admitting: Psychiatry

## 2022-04-29 DIAGNOSIS — F331 Major depressive disorder, recurrent, moderate: Secondary | ICD-10-CM | POA: Diagnosis not present

## 2022-04-29 LAB — URINALYSIS, COMPLETE W/RFL CULTURE
Hyaline Cast: NONE SEEN /LPF
Nitrites, Initial: NEGATIVE
Specific Gravity, Urine: 1.025 (ref 1.001–1.035)
WBC, UA: >=60 /HPF — AB (ref 0–5)
pH: 5.5 (ref 5.0–8.0)

## 2022-04-29 LAB — URINE CULTURE
MICRO NUMBER:: 14117616
SPECIMEN QUALITY:: ADEQUATE

## 2022-04-29 LAB — CULTURE INDICATED

## 2022-04-29 MED ORDER — NITROFURANTOIN MONOHYD MACRO 100 MG PO CAPS: 100.0000 mg | ORAL_CAPSULE | Freq: Two times a day (BID) | ORAL | 0 refills | Status: DC

## 2022-04-29 NOTE — Progress Notes (Signed)
Virtual Visit via Video Note  I connected with [name] [name] on 04/29/22 at 3:10 PM EDT  by a video enabled telemedicine application and verified that I am speaking with the correct person using two identifiers.  Location: Patient: Home Provider: [place] office    I discussed the limitations of evaluation and management by telemedicine and the availability of in person appointments. The patient expressed understanding and agreed to proceed.  I provided  48 minutes of non-face-to-face time during this encounter.   Alonza Smoker, LCSW         Session Time:  Thursday 04/15/2022 3:12 PM - 4:00 PM   Participation Level: Active  Behavioral Response: CasualAlert/euthymic  Type of Therapy: Individual Therapy  Treatment Goals addressed: Reduce frequency, intensity, and duration of depressive symptoms as evidenced by reducing.'s of depressive symptoms from 5 days/week to 3 days/week per patient's report, practice behavioral activation skills 2 times per week for the next 12 weeks  Progress on goals: Progressing  Interventions: CBT and Supportive    Summary: [name] [name] is a 56 y.o. female who is referred for sevices for continuity of care by therapist Lise Auer while therapist is on temporary leave. She denies any psychiatric hospitalizations. She has been in outpatient therapy for a year and currently sees psychiatrist Dr. [name] for medication management.  She reports needing help with stress management mainly with work issues.  Patient reports being under a lot of stress due to work demands and says it has implications for her diabetes management.  She reports tendency to internalize thoughts and feelings. She reports frequent worry about not getting information timely from coworkers to perform her job.  Current symptoms include difficulty concentrating, fatigue, thoughts/feelings of hopelessness, irritability, sleep difficulty, tearfulness, fatigue, and  excessive worry.      Patient last was seen via virtual visit about 2-3 weeks ago. Patient reports continued improved mood, continued behavioral activation, and continued positive self-care since last session.  She reports increased use of body scan meditation and reports this has been very helpful in reducing stress both at work and during her commute.  She also has been taking a brisk walk or participating in physically demanding activities when she gets home.  Patient reports this has been helpful as well.  Patient reports experiencing dreams about her childhood in recent weeks.  She discloses more information in session today regarding relationship with her mother as it was traumatic.   Suicidal/Homicidal: Nowithout intent/plan  Therapist Response: reviewed symptoms, praised and reinforced patient's increased efforts to practice body scan/do brisk walking/engage in physical activities, discussed effects, facilitated patient expressing thoughts and feelings about relationship with mother in childhood, assisted patient practice a grounding technique to regain composure, discussed effects of traumatic experience on patient's current functioning, reviewed information on response system, assisted patient continued to distinguish between perceived threat and real threat, developed plan with patient to engage in a brisk walk 15 to 20 minutes after today's session, also encouraged patient to continue using techniques discussed in session   plan: Return in 2 weeks  Diagnosis: Axis I: MDD    Anxiety    Collaboration of Care: Psychiatrist AEB patient works with psychiatrist Dr. [name], clinician encouraged patient to contact psychiatrist Dr. [name] regarding medication concerns.  Patient/Guardian was advised Release of Information must be obtained prior to any record release in order to collaborate their care with an outside provider. Patient/Guardian was advised if they have not already done so to contact  the registration  department to sign all necessary forms in order for [place] to release information regarding their care.   Consent: Patient/Guardian gives verbal consent for treatment and assignment of benefits for services provided during this visit. Patient/Guardian expressed understanding and agreed to proceed.    [name], LCSW 04/29/2022

## 2022-04-30 ENCOUNTER — Encounter (HOSPITAL_COMMUNITY): Payer: Self-pay | Admitting: Psychiatry

## 2022-04-30 ENCOUNTER — Telehealth (HOSPITAL_BASED_OUTPATIENT_CLINIC_OR_DEPARTMENT_OTHER): Payer: 59 | Admitting: Psychiatry

## 2022-04-30 DIAGNOSIS — F419 Anxiety disorder, unspecified: Secondary | ICD-10-CM

## 2022-04-30 DIAGNOSIS — F331 Major depressive disorder, recurrent, moderate: Secondary | ICD-10-CM

## 2022-04-30 MED ORDER — VENLAFAXINE HCL ER 37.5 MG PO CP24: ORAL_CAPSULE | ORAL | 0 refills | Status: DC

## 2022-04-30 MED ORDER — BUPROPION HCL ER (XL) 300 MG PO TB24: 300.0000 mg | ORAL_TABLET | ORAL | 0 refills | Status: DC

## 2022-04-30 NOTE — Progress Notes (Signed)
Virtual Visit via Telephone Note  I connected with [name] [name] on 04/30/22 at 10:20 AM EDT by telephone and verified that I am speaking with the correct person using two identifiers.  Location: Patient: Home Provider: Home Office   I discussed the limitations, risks, security and privacy concerns of performing an evaluation and management service by telephone and the availability of in person appointments. I also discussed with the patient that there may be a patient responsible charge related to this service. The patient expressed understanding and agreed to proceed.   History of Present Illness: Is evaluated by phone session.  She is doing well on her current medication.  She is also seeing Maurice Small for therapy.  She denies any major panic attack.  Recently she had to travel to [place] and she feels some anxiety and nervousness but it was manageable.  She is sleeping good.  For past 1 week she has not taken Xanax.  Her job is stressful but manageable.  She also checks her blood sugar every day and having appointment with her endocrinologist in coming weeks.  She denies any anhedonia, crying spells or any feeling of hopelessness or worthlessness.  She reported Wellbutrin helping but sometime when she opened the bottle she smelled the medicine which is very strong but otherwise no major issues.  Her plan is to spend Thanksgiving with the family.  She is compliant with Wellbutrin and Effexor and denies any tremors, shakes or any EPS.  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   Past psychiatric history; No h/o inpatient treatment or suicidal attempt.  No h/o psychosis, mania, or abuse.  Took Wellbutrin prescribed by PCP.  Dose increased to 450 few years ago.  We tried Lamictal but caused rash.  Neurologist had prescribed Adderall to help her memory.  Her appetite is okay.  Since she cut down her Adderall she noticed some time feeling tired but she is sleeping  better.  Psychiatric Specialty Exam: Physical Exam  Review of Systems  Weight 145 lb (65.8 kg), last menstrual period 06/28/2000.There is no height or weight on file to calculate BMI.  General Appearance: NA  Eye Contact:  NA  Speech:  Clear and Coherent  Volume:  Normal  Mood:  Euthymic  Affect:  NA  Thought Process:  Goal Directed  Orientation:  Full (Time, Place, and Person)  Thought Content:  Logical  Suicidal Thoughts:  No  Homicidal Thoughts:  No  Memory:  Immediate;   Good Recent;   Good Remote;   Good  Judgement:  Good  Insight:  Present  Psychomotor Activity:  NA  Concentration:  Concentration: Good and Attention Span: Good  Recall:  Good  Fund of Knowledge:  Good  Language:  Good  Akathisia:  No  Handed:  Right  AIMS (if indicated):     Assets:  Communication Skills Desire for Improvement Housing Resilience Social Support Talents/Skills Transportation  ADL's:  Intact  Cognition:  WNL  Sleep:   ok      Assessment and Plan: Major depressive disorder, recurrent.  Anxiety.  Patient is stable on the combination of Wellbutrin XL 300 mg daily and Effexor 112.5 daily.  She occasionally takes Xanax.  I encouraged to continue therapy with Maurice Small.  She does not need a new prescription of Xanax at this time.  She gets the Adderall from her neurology.  Recommend to call us back if there is any question or any concern.  Follow-up in 3 months.  Follow Up Instructions:    I discussed the assessment and treatment plan with the patient. The patient was provided an opportunity to ask questions and all were answered. The patient agreed with the plan and demonstrated an understanding of the instructions.   The patient was advised to call back or seek an in-person evaluation if the symptoms worsen or if the condition fails to improve as anticipated.  Collaboration of Care: Other provider involved in patient's care AEB notes are available in epic to  review.  Patient/Guardian was advised Release of Information must be obtained prior to any record release in order to collaborate their care with an outside provider. Patient/Guardian was advised if they have not already done so to contact the registration department to sign all necessary forms in order for [place] to release information regarding their care.   Consent: Patient/Guardian gives verbal consent for treatment and assignment of benefits for services provided during this visit. Patient/Guardian expressed understanding and agreed to proceed.    I provided 18 minutes of non-face-to-face time during this encounter.   Kathlee Nations, MD

## 2022-05-13 ENCOUNTER — Ambulatory Visit (INDEPENDENT_AMBULATORY_CARE_PROVIDER_SITE_OTHER): Payer: 59 | Admitting: Psychiatry

## 2022-05-13 DIAGNOSIS — F331 Major depressive disorder, recurrent, moderate: Secondary | ICD-10-CM | POA: Diagnosis not present

## 2022-05-13 DIAGNOSIS — F419 Anxiety disorder, unspecified: Secondary | ICD-10-CM

## 2022-05-13 NOTE — Progress Notes (Signed)
Virtual Visit via Video Note  I connected with [name] [name] on 05/13/22 at 3:18 PM by a video enabled telemedicine application and verified that I am speaking with the correct person using two identifiers.  Location: Patient: Home Provider: [facility] [place] [facility]    I discussed the limitations of evaluation and management by telemedicine and the availability of in person appointments. The patient expressed understanding and agreed to proceed.   I provided 40 minutes of non-face-to-face time during this encounter.   [name], LCSW         Session Time:  Thursday 05/13/2022 3:18 PM -  3:58 PM   Participation Level: Active  Behavioral Response: CasualAlert/euthymic  Type of Therapy: Individual Therapy  Treatment Goals addressed: Reduce frequency, intensity, and duration of depressive symptoms as evidenced by reducing.'s of depressive symptoms from 5 days/week to 3 days/week per patient's report, practice behavioral activation skills 2 times per week for the next 12 weeks  Progress on goals: Progressing  Interventions: CBT and Supportive    Summary: [name] [name] is a 56 y.o. female who is referred for sevices for continuity of care by therapist Hilbert Odor while therapist is on temporary leave. She denies any psychiatric hospitalizations. She has been in outpatient therapy for a year and currently sees psychiatrist [name] for medication management.  She reports needing help with stress management mainly with work issues.  Patient reports being under a lot of stress due to work demands and says it has implications for her diabetes management.  She reports tendency to internalize thoughts and feelings. She reports frequent worry about not getting information timely from coworkers to perform her job.  Current symptoms include difficulty concentrating, fatigue, thoughts/feelings of hopelessness, irritability, sleep difficulty, tearfulness, fatigue, and  excessive worry.      Patient last was seen via virtual visit about 2-3 weeks ago. Patient reports continued improved mood, continued behavioral activation, and continued positive self-care since last session.  She reports continued use of body scan meditation and reports this has been very helpful. She also reports increased involvement in physical activity including walking daily and says this has been helpful in managing stress.  She states feeling more hopeful and optimistic.  She reports some reduction in work stress as she has been able to work remotely for the past 2 weeks.  Patient reports having a few more dreams about her childhood since last session.  She reports pattern of trying to suppress her thoughts and feelings about her childhood and her relationship with her mother.  She expresses frustration as she recognizes her pattern of not trusting people and the effects on her past and current relationships.     Suicidal/Homicidal: Nowithout intent/plan  Therapist Response: reviewed symptoms, praised and reinforced patient's increased efforts to practice body scan/do brisk walking/engage in physical activities, discussed effects, courage patient to maintain consistent efforts, facilitated patient expressing thoughts and feelings about relationship with mother in childhood, facilitated patient identifying and verbalizing feelings of anger and disappointment, assisted patient identify possible effects on patient's interpersonal skills/relationships, began to discuss next steps for treatment   plan: Return in 2 weeks  Diagnosis: Axis I: MDD    Anxiety    Collaboration of Care: Psychiatrist AEB patient works with psychiatrist [name], clinician encouraged patient to contact psychiatrist [name] regarding medication concerns.  Patient/Guardian was advised Release of Information must be obtained prior to any record release in order to collaborate their care with an outside provider.  Patient/Guardian was  advised if they have not already done so to contact the registration department to sign all necessary forms in order for [place] to release information regarding their care.   Consent: Patient/Guardian gives verbal consent for treatment and assignment of benefits for services provided during this visit. Patient/Guardian expressed understanding and agreed to proceed.    [name], LCSW 05/13/2022

## 2022-05-17 ENCOUNTER — Other Ambulatory Visit: Payer: Managed Care, Other (non HMO)

## 2022-05-27 ENCOUNTER — Ambulatory Visit (INDEPENDENT_AMBULATORY_CARE_PROVIDER_SITE_OTHER): Payer: 59 | Admitting: Psychiatry

## 2022-05-27 DIAGNOSIS — F419 Anxiety disorder, unspecified: Secondary | ICD-10-CM | POA: Diagnosis not present

## 2022-05-27 DIAGNOSIS — F331 Major depressive disorder, recurrent, moderate: Secondary | ICD-10-CM

## 2022-05-27 DIAGNOSIS — F3289 Other specified depressive episodes: Secondary | ICD-10-CM

## 2022-05-27 NOTE — Progress Notes (Signed)
Virtual Visit via Video Note  I connected with [name] [name] on 05/27/22 at 1:14 PM EST  by a video enabled telemedicine application and verified that I am speaking with the correct person using two identifiers.  Location: Patient: Home  Provider: [facility] [place] [facility]    I discussed the limitations of evaluation and management by telemedicine and the availability of in person appointments. The patient expressed understanding and agreed to proceed.  I provided 48 minutes of non-face-to-face time during this encounter.   [name], LCSW         Session Time:  Thursday 05/27/2022 1:14 PM - 2:02 PM   Participation Level: Active  Behavioral Response: CasualAlert/euthymic  Type of Therapy: Individual Therapy  Treatment Goals addressed: Reduce frequency, intensity, and duration of depressive symptoms as evidenced by reducing.'s of depressive symptoms from 5 days/week to 3 days/week per patient's report, practice behavioral activation skills 2 times per week for the next 12 weeks  Progress on goals: Progressing  Interventions: CBT and Supportive    Summary: [name] [name] is a 56 y.o. female who is referred for sevices for continuity of care by therapist Hilbert Odor while therapist is on temporary leave. She denies any psychiatric hospitalizations. She has been in outpatient therapy for a year and currently sees psychiatrist [name] for medication management.  She reports needing help with stress management mainly with work issues.  Patient reports being under a lot of stress due to work demands and says it has implications for her diabetes management.  She reports tendency to internalize thoughts and feelings. She reports frequent worry about not getting information timely from coworkers to perform her job.  Current symptoms include difficulty concentrating, fatigue, thoughts/feelings of hopelessness, irritability, sleep difficulty, tearfulness, fatigue, and  excessive worry.      Patient last was seen via virtual visit about 2  weeks ago. Patient reports continued improved mood, continued behavioral activation, and continued positive self-care since last session.  She reports increased awareness of thought patterns and increased efforts to challenge and replace negative thoughts especially regarding current interpersonal relationships. She identifies  her relationship with her friend as her boyfriend in today' session. She reports a pattern of not labeling relationships due to fear of relationship not working out. She discloses more information today about her marriage and divorce. She reports having thoughts of being a failure when she thinks about being divorced.    Suicidal/Homicidal: Nowithout intent/plan  Therapist Response: reviewed symptoms, praised and reinforced patient's increased awareness and efforts to challenge and replace negative thoughts regarding current interpersonal relationships, facilitated patient expressing thoughts and feelings about her relationships including her marriage/divorce, assisted patient began to examine her patterns in relationships, assisted patient identify/challenge/and replace thoughts of being a failure with more rational thoughts, plan: Return in 2 weeks  Diagnosis: Axis I: MDD    Anxiety    Collaboration of Care: Psychiatrist AEB patient works with psychiatrist [name], clinician encouraged patient to contact psychiatrist [name] regarding medication concerns.  Patient/Guardian was advised Release of Information must be obtained prior to any record release in order to collaborate their care with an outside provider. Patient/Guardian was advised if they have not already done so to contact the registration department to sign all necessary forms in order for [place] to release information regarding their care.   Consent: Patient/Guardian gives verbal consent for treatment and assignment of benefits for services  provided during this visit. Patient/Guardian expressed understanding and agreed to proceed.  [name], LCSW 05/27/2022

## 2022-06-03 ENCOUNTER — Ambulatory Visit
Admission: RE | Admit: 2022-06-03 | Discharge: 2022-06-03 | Disposition: A | Discharge status: Home / Self Care | Payer: Managed Care, Other (non HMO) | Source: Ambulatory Visit | Attending: Obstetrics and Gynecology | Admitting: Obstetrics and Gynecology

## 2022-06-03 DIAGNOSIS — N63 Unspecified lump in unspecified breast: Secondary | ICD-10-CM

## 2022-06-03 NOTE — Progress Notes (Deleted)
56 y.o. G0P0000 Divorced [place] American female here for annual exam.    PCP:     Patient's last menstrual period was 06/28/2000.           Sexually active: {yes no:314532}  The current method of family planning is status post hysterectomy.    Exercising: {yes no:314532}  {types:19826} Smoker:  {YES [id number]  Health Maintenance: Pap:  01/04/22 LSIL: negative HR HPV, 06/11/21 HSIL: negative HR HPV, 05/29/20 ASCUS: negative HR HPV History of abnormal Pap:  yes, Colpo 07/13/21 showed koilocytic atypia, consistent with LGSIL. 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 02/2013 Ascus with Neg HR HPV    MMG:  07/23/19, Breast Density Category B, BI-RADS CATEGORY 3 Probably benign Colonoscopy:  04/25/13 BMD:   n/a  Result  n/a TDaP:  03/09/13 Gardasil:  no HIV: 05/28/19-NR Hep C: 05/21/19- neg Screening Labs:  Hb today: ***, Urine today: ***   reports that she has never smoked. She has never used smokeless tobacco. She reports that she does not drink alcohol and does not use drugs.  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   Diabetes mellitus    Dry eyes, bilateral 2018   Fall at home 05/2018   injured low back/spine/sciatic nerve--had 2 spinal injections   Ketoacidosis 3295,[id number]   Melasma 01/2020   Meningitis 06/2001   --hospital   Rheumatoid arthritis (HCC) 2014   Sleep apnea 2013   Sleep apnea 2011   STD (sexually transmitted disease) 6/09   HSV II   Stress    Thyroid disease    hypothyroidism   VAIN I (vaginal intraepithelial neoplasia grade I) 2013    Past Surgical History:  Procedure Laterality Date   ABDOMINAL HYSTERECTOMY     2004   CERVICAL BIOPSY  W/ LOOP ELECTRODE EXCISION  01-24-95   CIN I   COLPOSCOPY  11-21-02   CIN III   COLPOSCOPY  05-05-12   no lesions--needs repeat pap 08/2012 per Dr. Tresa Res   HAND SURGERY  2004    tore tendon   positive HPV  03/21/15   normal pap and negative types 16/18   SOFT TISSUE CYST EXCISION  12/2010   left breast epidermoid inclusion cyst   SPINE SURGERY  05/21/14   TONSILLECTOMY AND ADENOIDECTOMY      Current Outpatient Medications  Medication Sig Dispense Refill   ALPRAZolam (XANAX) 0.25 MG tablet Take 1 tablet (0.25 mg total) by mouth 2 (two) times daily as needed for anxiety. 60 tablet 0   amphetamine-dextroamphetamine (ADDERALL) 10 MG tablet Take 1 tablet (10 mg total) by mouth 2 (two) times daily. 60 tablet 0   Ascorbic Acid (VITAMIN C) 1000 MG tablet Take 1,000 mg by mouth daily.     BD PEN NEEDLE NANO 2ND GEN 32G X 4 MM MISC USE TO ADMINISTER INSULIN 4 TIMES DAILY IF NOT ON INSULIN PUMP     Biotin 10 MG CAPS Take 1 capsule by mouth daily.     buPROPion (WELLBUTRIN XL) 300 MG 24 hr tablet Take 1 tablet (300 mg total) by mouth every morning. 90 tablet 0   Continuous Blood Gluc Sensor (DEXCOM G6 SENSOR) MISC CHANGE EVERY 10 DAYS TO MONITOR BLOOD GLUCOSE CONTINUOUSLY     Continuous Blood Gluc Transmit (DEXCOM G6 TRANSMITTER) MISC CHANGE EVERY 3 MONTHS TO MONITOR BLOOD  GLUCOSE CONTINUOUSLY     estrogens, conjugated, (PREMARIN) 0.625 MG tablet 1 tablet Orally Once a day     fluconazole (DIFLUCAN) 150 MG tablet Take 1 tablet (150 mg total) by mouth every 3 (three) days. 2 tablet 0   insulin glargine (LANTUS SOLOSTAR) 100 UNIT/ML Solostar Pen 30u once a day Subcutaneous for 30 days     Insulin Human (INSULIN PUMP) SOLN Inject 25 each into the skin as directed. Throughout the day (Per BS)     Insulin Lispro w/ Trans Port 100 UNIT/ML SOPN INJECT AS DIRECTED 3 TIMES A DAY (USE A MAX TOTAL DAILY DOSE OF 30 UNITS) IF INSULIN PUMP FAILURE     Insulin Pen Needle (BD PEN NEEDLE NANO U/F) 32G X 4 MM MISC Use to administer insulin 4 times daily if not on insulin pump     levothyroxine (SYNTHROID) 137 MCG tablet take one tablet but mouth daily, but skip Sunday     metroNIDAZOLE (FLAGYL)  500 MG tablet Take 1 tablet (500 mg total) by mouth 2 (two) times daily. 14 tablet 0   Multiple Vitamins-Minerals (QC WOMENS DAILY MULTIVITAMIN) TABS Take one tablet once daily Oral     nitrofurantoin, macrocrystal-monohydrate, (MACROBID) 100 MG capsule Take 1 capsule (100 mg total) by mouth 2 (two) times daily. 14 capsule 0   venlafaxine XR (EFFEXOR XR) 37.5 MG 24 hr capsule Take three capsule daily 270 capsule 0   Vitamin D, Ergocalciferol, (DRISDOL) 1.25 MG (50000 UNIT) CAPS capsule Take 50,000 Units by mouth once a week.     XIIDRA 5 % SOLN Apply to eye 2 (two) times daily.     No current facility-administered medications for this visit.    Family History  Problem Relation Age of Onset   Other Mother        cysts   Diabetes Father    Kidney disease Father    Stroke Paternal Aunt    Breast cancer Neg Hx     Review of Systems  Exam:   LMP 06/28/2000     General appearance: alert, cooperative and appears stated age Head: normocephalic, without obvious abnormality, atraumatic Neck: no adenopathy, supple, symmetrical, trachea midline and thyroid normal to inspection and palpation Lungs: clear to auscultation bilaterally Breasts: normal appearance, no masses or tenderness, No nipple retraction or dimpling, No nipple discharge or bleeding, No axillary adenopathy Heart: regular rate and rhythm Abdomen: soft, non-tender; no masses, no organomegaly Extremities: extremities normal, atraumatic, no cyanosis or edema Skin: skin color, texture, turgor normal. No rashes or lesions Lymph nodes: cervical, supraclavicular, and axillary nodes normal. Neurologic: grossly normal  Pelvic: External genitalia:  no lesions              No abnormal inguinal nodes palpated.              Urethra:  normal appearing urethra with no masses, tenderness or lesions              Bartholins and Skenes: normal                 Vagina: normal appearing vagina with normal color and discharge, no lesions               Cervix: no lesions              Pap taken: {yes no:314532} Bimanual Exam:  Uterus:  normal size, contour, position, consistency, mobility, non-tender              Adnexa:  no mass, fullness, tenderness              Rectal exam: {yes no:314532}.  Confirms.              Anus:  normal sphincter tone, no lesions  Chaperone was present for exam:  ***  Assessment:   Well woman visit with gynecologic exam.   Plan: Mammogram screening discussed. Self breast awareness reviewed. Pap and HR HPV as above. Guidelines for Calcium, Vitamin D, regular exercise program including cardiovascular and weight bearing exercise.   Follow up annually and prn.   Additional counseling given.  {yes [id number]. _______ minutes face to face time of which over 50% was spent in counseling.    After visit summary provided.

## 2022-06-09 ENCOUNTER — Other Ambulatory Visit: Payer: Self-pay | Admitting: Neurology

## 2022-06-09 DIAGNOSIS — G4733 Obstructive sleep apnea (adult) (pediatric): Secondary | ICD-10-CM

## 2022-06-09 DIAGNOSIS — G4711 Idiopathic hypersomnia with long sleep time: Secondary | ICD-10-CM

## 2022-06-09 NOTE — Telephone Encounter (Signed)
Pt is calling. Requesting a refill on medication amphetamine-dextroamphetamine (ADDERALL) 10 MG tablet. [phone]

## 2022-06-09 NOTE — Addendum Note (Signed)
Addended by: Guy Begin on: 06/09/2022 02:38 PM   Modules accepted: Orders

## 2022-06-10 MED ORDER — AMPHETAMINE-DEXTROAMPHETAMINE 10 MG PO TABS: 10.0000 mg | ORAL_TABLET | Freq: Two times a day (BID) | ORAL | 0 refills | Status: DC

## 2022-06-11 ENCOUNTER — Ambulatory Visit (HOSPITAL_COMMUNITY): Payer: 59 | Admitting: Psychiatry

## 2022-06-11 DIAGNOSIS — F331 Major depressive disorder, recurrent, moderate: Secondary | ICD-10-CM

## 2022-06-11 NOTE — Progress Notes (Signed)
Virtual Visit via Video Note  I connected with [name] [name] on 06/11/22 at 11:05 AM EST  by a video enabled telemedicine application and verified that I am speaking with the correct person using two identifiers.  Location: Patient: Home Provider: [facility] [place] office    I discussed the limitations of evaluation and management by telemedicine and the availability of in person appointments. The patient expressed understanding and agreed to proceed.  I provided 48 minutes of non-face-to-face time during this encounter.   [name], LCSW         Session Time:  Friday  06/11/2022 11:05 AM - 11:53 AM   Participation Level: Active  Behavioral Response: CasualAlert/euthymic  Type of Therapy: Individual Therapy  Treatment Goals addressed: Reduce frequency, intensity, and duration of depressive symptoms as evidenced by reducing.'s of depressive symptoms from 5 days/week to 3 days/week per patient's report, practice behavioral activation skills 2 times per week for the next 12 weeks  Progress on goals: Progressing  Interventions: CBT and Supportive    Summary: [name] [name] is a 56 y.o. female who is referred for sevices for continuity of care by therapist Hilbert Odor while therapist is on temporary leave. She denies any psychiatric hospitalizations. She has been in outpatient therapy for a year and currently sees psychiatrist [name] for medication management.  She reports needing help with stress management mainly with work issues.  Patient reports being under a lot of stress due to work demands and says it has implications for her diabetes management.  She reports tendency to internalize thoughts and feelings. She reports frequent worry about not getting information timely from coworkers to perform her job.  Current symptoms include difficulty concentrating, fatigue, thoughts/feelings of hopelessness, irritability, sleep difficulty, tearfulness, fatigue, and  excessive worry.      Patient last was seen via virtual visit about 2 weeks ago. Patient reports continued improved mood, continued behavioral activation, and continued positive self-care since last session.  She reports continued issues regarding interpersonal relationships.  She continues to fear her current relationship may not work out if she labels it.  She still struggles with thoughts of not being good enough, not being deserving of happiness, and not being able to trust.  This causes patient difficulty in moving forward in relationships.  She discloses more information today about her childhood and childhood trauma.  Per patient's report, she has nightmares about 1 time per week.  She also reports hyperarousal at times as well as avoidant behaviors.     Suicidal/Homicidal: Nowithout intent/plan  Therapist Response: reviewed symptoms, praised and reinforced patient's continued behavioral activation/positive self-care/use of relaxation techniques, facilitated patient sharing thoughts and feelings about childhood and childhood trauma, began to discuss the effects of trauma on thought patterns and current functioning, discussed next steps for treatment to include assessing more for PTSD as well possibility of using CPT to address possible stuck points related to trauma. plan: Return in 2 weeks  Diagnosis: Axis I: MDD    Anxiety    Collaboration of Care: Psychiatrist AEB patient works with psychiatrist [name], clinician encouraged patient to contact psychiatrist [name] regarding medication concerns.  Patient/Guardian was advised Release of Information must be obtained prior to any record release in order to collaborate their care with an outside provider. Patient/Guardian was advised if they have not already done so to contact the registration department to sign all necessary forms in order for [place] to release information regarding their care.   Consent: Patient/Guardian gives  verbal  consent for treatment and assignment of benefits for services provided during this visit. Patient/Guardian expressed understanding and agreed to proceed.    [name], LCSW 06/11/2022

## 2022-06-17 ENCOUNTER — Telehealth (HOSPITAL_COMMUNITY): Payer: Self-pay | Admitting: *Deleted

## 2022-06-17 ENCOUNTER — Ambulatory Visit: Payer: Managed Care, Other (non HMO) | Admitting: Obstetrics and Gynecology

## 2022-06-17 DIAGNOSIS — F419 Anxiety disorder, unspecified: Secondary | ICD-10-CM

## 2022-06-17 MED ORDER — ALPRAZOLAM 0.25 MG PO TABS: 0.2500 mg | ORAL_TABLET | Freq: Two times a day (BID) | ORAL | 0 refills | Status: DC | PRN

## 2022-06-17 NOTE — Telephone Encounter (Signed)
Pt called requesting a refill of Xanax 0.25 mg ordered bid prn. Last prescribed on 03/24/22 for #60. Pt has an upcoming appointment scheduled for 07/30/22. Please review and advise.

## 2022-06-17 NOTE — Telephone Encounter (Signed)
Prescription sent to local [facility].

## 2022-06-24 ENCOUNTER — Telehealth (HOSPITAL_COMMUNITY): Payer: Self-pay | Admitting: Psychiatry

## 2022-06-24 ENCOUNTER — Ambulatory Visit (HOSPITAL_COMMUNITY): Payer: 59 | Admitting: Psychiatry

## 2022-06-24 NOTE — Telephone Encounter (Signed)
Therapist contacted patient via text through caregility platform for scheduled appointment.  Patient is very sick today.  Patient and therapist agreed to cancel today's appointment.

## 2022-07-15 ENCOUNTER — Other Ambulatory Visit (HOSPITAL_COMMUNITY): Payer: Self-pay | Admitting: Psychiatry

## 2022-07-15 DIAGNOSIS — F419 Anxiety disorder, unspecified: Secondary | ICD-10-CM

## 2022-07-19 NOTE — Progress Notes (Deleted)
57 y.o. G0P0000 Divorced [place] American female here for annual exam.    PCP:     Patient's last menstrual period was 06/28/2000.           Sexually active: {yes no:314532}  The current method of family planning is status post hysterectomy.    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 02/2013 Ascus with Neg HR HPV  MMG:  06/03/22 Breast Density Category C, BI-RADS CATEGORY 1 Neg Colonoscopy:  04/25/13 BMD:   n/a  Result  n/a TDaP:  03/09/13 Gardasil:   no HIV: 05/21/19 NR Hep C: 05/21/19 neg Screening Labs:  Hb today: ***, Urine today: ***   reports that she has never smoked. She has never used smokeless tobacco. She reports that she does not drink alcohol and does not use drugs.  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   Diabetes mellitus    Dry eyes, bilateral 2018   Fall at home 05/2018   injured low back/spine/sciatic nerve--had 2 spinal injections   Ketoacidosis [id number]   Melasma 01/2020   Meningitis 06/2001   --hospital   Rheumatoid arthritis ([place]) 2014   Sleep apnea 2013   Sleep apnea 2011   STD (sexually transmitted disease) 6/09   HSV II   Stress    Thyroid disease    hypothyroidism   VAIN I (vaginal intraepithelial neoplasia grade I) 2013    Past Surgical History:  Procedure Laterality Date   ABDOMINAL HYSTERECTOMY     2004   CERVICAL BIOPSY  W/ LOOP ELECTRODE EXCISION  01-24-95   CIN I   COLPOSCOPY  11-21-02   CIN III   COLPOSCOPY  05-05-12   no lesions--needs repeat pap 08/2012 per Dr. Joan Flores   HAND SURGERY  2004   tore tendon   positive HPV  03/21/15   normal pap and negative types 16/18   SOFT TISSUE CYST EXCISION  12/2010   left breast  epidermoid inclusion cyst   SPINE SURGERY  05/21/14   TONSILLECTOMY AND ADENOIDECTOMY      Current Outpatient Medications  Medication Sig Dispense Refill   ALPRAZolam (XANAX) 0.25 MG tablet Take 1 tablet (0.25 mg total) by mouth 2 (two) times daily as needed for anxiety. 60 tablet 0   amphetamine-dextroamphetamine (ADDERALL) 10 MG tablet Take 1 tablet (10 mg total) by mouth 2 (two) times daily. 60 tablet 0   Ascorbic Acid (VITAMIN C) 1000 MG tablet Take 1,000 mg by mouth daily.     BD PEN NEEDLE NANO 2ND GEN 32G X 4 MM MISC USE TO ADMINISTER INSULIN 4 TIMES DAILY IF NOT ON INSULIN PUMP     Biotin 10 MG CAPS Take 1 capsule by mouth daily.     buPROPion (WELLBUTRIN XL) 300 MG 24 hr tablet Take 1 tablet (300 mg total) by mouth every morning. 90 tablet 0   Continuous Blood Gluc Sensor (DEXCOM G6 SENSOR) MISC CHANGE EVERY 10 DAYS TO MONITOR BLOOD GLUCOSE CONTINUOUSLY     Continuous Blood Gluc Transmit (DEXCOM G6 TRANSMITTER) MISC CHANGE EVERY 3 MONTHS TO MONITOR BLOOD GLUCOSE CONTINUOUSLY     estrogens, conjugated, (PREMARIN) 0.625 MG tablet 1 tablet  Orally Once a day     fluconazole (DIFLUCAN) 150 MG tablet Take 1 tablet (150 mg total) by mouth every 3 (three) days. 2 tablet 0   insulin glargine (LANTUS SOLOSTAR) 100 UNIT/ML Solostar Pen 30u once a day Subcutaneous for 30 days     Insulin Human (INSULIN PUMP) SOLN Inject 25 each into the skin as directed. Throughout the day (Per BS)     Insulin Lispro w/ Trans Port 100 UNIT/ML SOPN INJECT AS DIRECTED 3 TIMES A DAY (USE A MAX TOTAL DAILY DOSE OF 30 UNITS) IF INSULIN PUMP FAILURE     Insulin Pen Needle (BD PEN NEEDLE NANO U/F) 32G X 4 MM MISC Use to administer insulin 4 times daily if not on insulin pump     levothyroxine (SYNTHROID) 137 MCG tablet take one tablet but mouth daily, but skip Sunday     metroNIDAZOLE (FLAGYL) 500 MG tablet Take 1 tablet (500 mg total) by mouth 2 (two) times daily. 14 tablet 0   Multiple Vitamins-Minerals (QC WOMENS  DAILY MULTIVITAMIN) TABS Take one tablet once daily Oral     nitrofurantoin, macrocrystal-monohydrate, (MACROBID) 100 MG capsule Take 1 capsule (100 mg total) by mouth 2 (two) times daily. 14 capsule 0   venlafaxine XR (EFFEXOR XR) 37.5 MG 24 hr capsule Take three capsule daily 270 capsule 0   Vitamin D, Ergocalciferol, (DRISDOL) 1.25 MG (50000 UNIT) CAPS capsule Take 50,000 Units by mouth once a week.     XIIDRA 5 % SOLN Apply to eye 2 (two) times daily.     No current facility-administered medications for this visit.    Family History  Problem Relation Age of Onset   Other Mother        cysts   Diabetes Father    Kidney disease Father    Stroke Paternal Aunt    Breast cancer Neg Hx     Review of Systems  Exam:   LMP 06/28/2000     General appearance: alert, cooperative and appears stated age Head: normocephalic, without obvious abnormality, atraumatic Neck: no adenopathy, supple, symmetrical, trachea midline and thyroid normal to inspection and palpation Lungs: clear to auscultation bilaterally Breasts: normal appearance, no masses or tenderness, No nipple retraction or dimpling, No nipple discharge or bleeding, No axillary adenopathy Heart: regular rate and rhythm Abdomen: soft, non-tender; no masses, no organomegaly Extremities: extremities normal, atraumatic, no cyanosis or edema Skin: skin color, texture, turgor normal. No rashes or lesions Lymph nodes: cervical, supraclavicular, and axillary nodes normal. Neurologic: grossly normal  Pelvic: External genitalia:  no lesions              No abnormal inguinal nodes palpated.              Urethra:  normal appearing urethra with no masses, tenderness or lesions              Bartholins and Skenes: normal                 Vagina: normal appearing vagina with normal color and discharge, no lesions              Cervix: no lesions              Pap taken: {yes no:314532} Bimanual Exam:  Uterus:  normal size, contour, position,  consistency, mobility, non-tender              Adnexa: no mass, fullness, tenderness  Rectal exam: {yes no:314532}.  Confirms.              Anus:  normal sphincter tone, no lesions  Chaperone was present for exam:  ***  Assessment:   Well woman visit with gynecologic exam.   Plan: Mammogram screening discussed. Self breast awareness reviewed. Pap and HR HPV as above. Guidelines for Calcium, Vitamin D, regular exercise program including cardiovascular and weight bearing exercise.   Follow up annually and prn.   Additional counseling given.  {yes [id number]. _______ minutes face to face time of which over 50% was spent in counseling.    After visit summary provided.

## 2022-07-22 ENCOUNTER — Ambulatory Visit (HOSPITAL_COMMUNITY): Payer: 59 | Admitting: Psychiatry

## 2022-07-22 DIAGNOSIS — F331 Major depressive disorder, recurrent, moderate: Secondary | ICD-10-CM | POA: Diagnosis not present

## 2022-07-23 ENCOUNTER — Telehealth (HOSPITAL_COMMUNITY): Payer: Self-pay | Admitting: *Deleted

## 2022-07-23 DIAGNOSIS — F419 Anxiety disorder, unspecified: Secondary | ICD-10-CM

## 2022-07-23 MED ORDER — ALPRAZOLAM 0.25 MG PO TABS: 0.2500 mg | ORAL_TABLET | Freq: Two times a day (BID) | ORAL | 0 refills | Status: DC | PRN

## 2022-07-23 NOTE — Telephone Encounter (Signed)
Done
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
## 2022-07-23 NOTE — Telephone Encounter (Signed)
Pt called requesting refill of Xanax 0.25 mg. Last prescribed on 06/17/22 for #60 1 BID PRN. Pt has an appointment scheduled for 07/30/22. Pt says she only has a couple of pills left. Please review and advise.

## 2022-07-30 ENCOUNTER — Telehealth (HOSPITAL_BASED_OUTPATIENT_CLINIC_OR_DEPARTMENT_OTHER): Payer: 59 | Admitting: Psychiatry

## 2022-07-30 ENCOUNTER — Encounter (HOSPITAL_COMMUNITY): Payer: Self-pay | Admitting: Psychiatry

## 2022-07-30 DIAGNOSIS — F331 Major depressive disorder, recurrent, moderate: Secondary | ICD-10-CM

## 2022-07-30 DIAGNOSIS — F419 Anxiety disorder, unspecified: Secondary | ICD-10-CM

## 2022-07-30 MED ORDER — ALPRAZOLAM 0.25 MG PO TABS: 0.2500 mg | ORAL_TABLET | Freq: Two times a day (BID) | ORAL | 2 refills | Status: DC | PRN

## 2022-07-30 MED ORDER — BUPROPION HCL ER (XL) 300 MG PO TB24: 300.0000 mg | ORAL_TABLET | ORAL | 0 refills | Status: DC

## 2022-07-30 MED ORDER — VENLAFAXINE HCL ER 37.5 MG PO CP24: ORAL_CAPSULE | ORAL | 0 refills | Status: DC

## 2022-07-30 NOTE — Progress Notes (Signed)
Virtual Visit via Telephone Note  I connected with [name] [name] on 07/30/22 at 10:00 AM EST by telephone and verified that I am speaking with the correct person using two identifiers.  Location: Patient: Home Provider: Home Office   I discussed the limitations, risks, security and privacy concerns of performing an evaluation and management service by telephone and the availability of in person appointments. I also discussed with the patient that there may be a patient responsible charge related to this service. The patient expressed understanding and agreed to proceed.   History of Present Illness: Patient is evaluated by phone session.  She told around Christmas got very sick and did not go to work for a while because having upper respiratory infection.  She reported around that time she was tired, sleeping too much, feels sad depressed and very anxious.  She reported at that time taking the Xanax twice a day to keep herself calm.  Now symptoms are resolved as her physical health is much better.  She resume work.  She reported chronic stress which is mostly due to work related.  She is looking for another job for a while.  She does hybrid work and goes 2 times a week in [place] office.  Her sleep is much better.  Energy level is fair.  She is in therapy with Peggy.  Her last blood work was done in December by her PCP Dr. Forde Dandy.  Her hemoglobin A1c 6.5.  Overall she feels much better physically.  She denies any feeling of hopelessness or worthlessness.  She reported pharmacy giving very frequently venlafaxine and she has multiple bottles which are not opened.  She has no tremors, shakes or any EPS.  She lost weight when she was sick but now slowly and gradually her appetite is coming back.  She started taking calcium and zinc.  Past psychiatric history; No h/o inpatient treatment or suicidal attempt.  No h/o psychosis, mania, or abuse.  Took Wellbutrin prescribed by PCP.  Dose increased to 450 few  years ago.  We tried Lamictal but caused rash.  Neurologist had prescribed Adderall to help her memory.  Her appetite is okay.  Since she cut down her Adderall she noticed some time feeling tired but she is sleeping better.   Psychiatric Specialty Exam: Physical Exam  Review of Systems  Weight 138 lb (62.6 kg), last menstrual period 06/28/2000.There is no height or weight on file to calculate BMI.  General Appearance: NA  Eye Contact:  NA  Speech:  Clear and Coherent and Normal Rate  Volume:  Normal  Mood:  Euthymic  Affect:  NA  Thought Process:  Goal Directed  Orientation:  Full (Time, Place, and Person)  Thought Content:  WDL  Suicidal Thoughts:  No  Homicidal Thoughts:  No  Memory:  Immediate;   Good Recent;   Good Remote;   Good  Judgement:  Good  Insight:  Present  Psychomotor Activity:  NA  Concentration:  Concentration: Good and Attention Span: Good  Recall:  Good  Fund of Knowledge:  Good  Language:  Good  Akathisia:  No  Handed:  Right  AIMS (if indicated):     Assets:  Communication Skills Desire for [employer]/Skills Transportation  ADL's:  Intact  Cognition:  WNL  Sleep:   good      Assessment and Plan: Patient taking Xanax 0.25 mg twice a day but on the weekend she does not take it and if she is  working from home she usually take 1 tablet.  We discussed the amount of the Xanax that she required in a month and she agreed to continue Xanax but cut down the tablet to 45 a month.  Her long-term plan is to come off from the Xanax.  I will also send venlafaxine 112.5 mg daily but she needed a note on the prescription that will call the pharmacy when she needs a prescription as she has some unopened bottles.  Continue Wellbutrin XL 300 mg daily.  Continue therapy with Maurice Small.  Recommended to call us back if she has any question or any concern.  Follow-up at 3 months.  Anxiety Xanax 0.25 mg to take as needed 1 to 2  tablet for severe anxiety and Effexor 112.5 mg daily  Moderate episode of recurrent major depressive disorder (HCC) Wellbutrin XL 300 mg daily and Effexor 112.5 mg daily.  Encourage therapy with Maurice Small.   Follow Up Instructions:    I discussed the assessment and treatment plan with the patient. The patient was provided an opportunity to ask questions and all were answered. The patient agreed with the plan and demonstrated an understanding of the instructions.   The patient was advised to call back or seek an in-person evaluation if the symptoms worsen or if the condition fails to improve as anticipated.  Collaboration of Care: Other provider involved in patient's care AEB notes are available in epic to review.  Patient/Guardian was advised Release of Information must be obtained prior to any record release in order to collaborate their care with an outside provider. Patient/Guardian was advised if they have not already done so to contact the registration department to sign all necessary forms in order for [place] to release information regarding their care.   Consent: Patient/Guardian gives verbal consent for treatment and assignment of benefits for services provided during this visit. Patient/Guardian expressed understanding and agreed to proceed.    I provided 20 minutes of non-face-to-face time during this encounter.   Kathlee Nations, MD

## 2022-08-02 ENCOUNTER — Ambulatory Visit: Payer: Managed Care, Other (non HMO) | Admitting: Obstetrics and Gynecology

## 2022-08-05 ENCOUNTER — Ambulatory Visit (HOSPITAL_COMMUNITY): Payer: 59 | Admitting: Psychiatry

## 2022-08-05 DIAGNOSIS — F331 Major depressive disorder, recurrent, moderate: Secondary | ICD-10-CM
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
## 2022-08-19 ENCOUNTER — Ambulatory Visit (HOSPITAL_COMMUNITY): Payer: 59 | Admitting: Psychiatry

## 2022-08-19 DIAGNOSIS — F331 Major depressive disorder, recurrent, moderate: Secondary | ICD-10-CM | POA: Diagnosis not present
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
## 2022-09-06 ENCOUNTER — Other Ambulatory Visit: Payer: Self-pay | Admitting: Neurology

## 2022-09-06 DIAGNOSIS — G4733 Obstructive sleep apnea (adult) (pediatric): Secondary | ICD-10-CM

## 2022-09-06 DIAGNOSIS — G4711 Idiopathic hypersomnia with long sleep time: Secondary | ICD-10-CM

## 2022-09-06 MED ORDER — AMPHETAMINE-DEXTROAMPHETAMINE 10 MG PO TABS: 10.0000 mg | ORAL_TABLET | Freq: Two times a day (BID) | ORAL | 0 refills | Status: DC

## 2022-09-06 NOTE — Telephone Encounter (Signed)
Pt is requesting a refill for amphetamine-dextroamphetamine (ADDERALL) 10 MG tablet .  Pharmacy: [facility] #[id number]

## 2022-09-06 NOTE — Telephone Encounter (Signed)
Last visit 09/17/21 Next visit 09/23/22  Checked [place] registry:

## 2022-09-06 NOTE — Addendum Note (Signed)
Addended by: Gildardo Griffes on: 09/06/2022 04:10 PM   Modules accepted: Orders

## 2022-09-09 ENCOUNTER — Ambulatory Visit (INDEPENDENT_AMBULATORY_CARE_PROVIDER_SITE_OTHER): Payer: 59 | Admitting: Psychiatry

## 2022-09-09 DIAGNOSIS — F331 Major depressive disorder, recurrent, moderate: Secondary | ICD-10-CM

## 2022-09-09 DIAGNOSIS — F339 Major depressive disorder, recurrent, unspecified: Secondary | ICD-10-CM
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
## 2022-09-23 ENCOUNTER — Ambulatory Visit: Payer: Managed Care, Other (non HMO) | Admitting: Neurology

## 2022-09-23 VITALS — BP 117/73 | HR 109 | Ht 64.0 in | Wt 141.8 lb

## 2022-09-23 DIAGNOSIS — G4711 Idiopathic hypersomnia with long sleep time: Secondary | ICD-10-CM

## 2022-09-23 DIAGNOSIS — G4733 Obstructive sleep apnea (adult) (pediatric): Secondary | ICD-10-CM | POA: Diagnosis not present

## 2022-09-23 NOTE — Progress Notes (Signed)
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 hr tablet Take 1 tablet (300 mg total) by mouth every morning.   Continuous Blood Gluc Sensor (DEXCOM G6 SENSOR) MISC CHANGE EVERY 10 DAYS TO MONITOR BLOOD GLUCOSE CONTINUOUSLY   Continuous Blood Gluc Transmit (DEXCOM G6 TRANSMITTER) MISC CHANGE EVERY 3 MONTHS TO MONITOR BLOOD GLUCOSE CONTINUOUSLY   estrogens, conjugated, (PREMARIN) 0.625 MG tablet 1 tablet Orally Once a day   insulin glargine (LANTUS SOLOSTAR) 100 UNIT/ML Solostar Pen 30u once a day Subcutaneous for 30 days   Insulin Human (INSULIN PUMP) SOLN Inject 25 each into the skin as directed. Throughout the day (Per BS)   Insulin Lispro w/ Trans Port 100 UNIT/ML SOPN INJECT AS DIRECTED 3 TIMES A DAY (USE A MAX TOTAL DAILY DOSE OF 30 UNITS) IF INSULIN PUMP FAILURE   Insulin Pen Needle (BD PEN NEEDLE NANO U/F) 32G X 4 MM MISC Use to administer insulin 4 times daily if not on insulin pump   levothyroxine (SYNTHROID) 137 MCG tablet take one tablet but mouth daily, but skip Sunday   Multiple Vitamins-Minerals (QC WOMENS DAILY MULTIVITAMIN) TABS Take one tablet once daily Oral   venlafaxine XR (EFFEXOR XR) 37.5 MG 24 hr capsule Take three capsule daily   Vitamin D, Ergocalciferol, (DRISDOL) 1.25 MG (50000 UNIT) CAPS capsule Take 50,000  Units by mouth once a week.   XIIDRA 5 % SOLN Apply to eye 2 (two) times daily.   [DISCONTINUED] fluconazole (DIFLUCAN) 150 MG tablet Take 1 tablet (150 mg total) by mouth every 3 (three) days.   [DISCONTINUED] metroNIDAZOLE (FLAGYL) 500 MG tablet Take 1 tablet (500 mg total) by mouth 2 (two) times daily.   [DISCONTINUED] nitrofurantoin, macrocrystal-monohydrate, (MACROBID) 100 MG capsule Take 1 capsule (100 mg total) by mouth 2 (two) times daily.   No facility-administered encounter medications on file as of 09/23/2022.  :  Review of Systems:  Out of a complete 14 point review of systems, all are reviewed and negative with the exception of these symptoms as listed below:  Review of Systems  Neurological:        Cpap followup.  Doing ok,  DL 30-90 days in pod.  ESS      Objective:  Neurological Exam  Physical Exam Physical Examination:  Vitals:   09/23/22 1401  BP: 117/73  Pulse: (!) 109    General Examination: The patient is a very pleasant 57 y.o. female in no acute distress. She appears well-developed and well-nourished and well groomed.   HEENT: Normocephalic, atraumatic, pupils are equal, round and reactive to light, extraocular tracking is good without limitation to gaze excursion or nystagmus noted. Corrective eyeglasses in place. Normal smooth pursuit is noted. Hearing is grossly intact. Face is symmetric with normal facial animation. Speech is clear with no dysarthria noted. Neck with FROM. Oropharynx exam reveals: No obvious change.  No carotid bruits.   Chest: Clear to auscultation without wheezing, rhonchi or crackles noted.   Heart: S1+S2+0, no murmur.     Abdomen: Soft, non-tender and non-distended.   Extremities: There is no obvious edema.      Skin: Warm and dry without trophic changes noted.    Musculoskeletal: exam reveals no obvious joint deformities.     Neurologically:  Mental status: The patient is awake, alert and oriented in all 4 spheres. Her  memory, attention, language and knowledge are appropriate. There is no aphasia, agnosia, apraxia or anomia. Speech is clear with normal prosody and enunciation. Thought process is linear. Mood is normal and affect is anxious.    Cranial nerves are as described above under HEENT exam.  Motor exam: Normal bulk, strength and tone is noted. There is no resting or action tremor. Sensory exam is intact to light touch in the upper and lower extremities. Fine motor skills are grossly intact. On cerebellar testing: no dysmetria, or ataxia.  Gait, station and balance are unremarkable. No veering to one side is noted. No leaning to one side is noted. Posture is age-appropriate and stance is narrow based. No problems turning are noted.    Assessment and Plan:    In summary, Ms. Halim is a very pleasant 57 year old female with an underlying history of hypothyroidism, DM type I, anxiety and depression, low back pain, and s/p neck surgery, who presents for follow up of her obstructive sleep apnea and hypersomnolence disorder. She continues to use her CPAP, continues to have difficulty with the interface because of skin irritation.  Her apnea is under great control and seal from the mask is excellent when she uses her nasal pillows. She has an average usage of 6-1/2 hours.  She continues to use low-dose Adderall for her daytime somnolence and has not increased it, in fact, usually only uses 10 mg once daily.  She is in the process of tapering off of Xanax as she has been on it for years and her psychiatrist is weaning her off of it slowly.  She has done well thus far.  She is no longer on gabapentin, she is on low-dose Effexor. We mutually agreed to maintain the course with her treatment with CPAP and low-dose Adderall.  She is advised to follow-up routinely in 1 year, sooner if needed. Of note, in the past, she has seen a neuropsychologist. We did workup for cognitive concerns, which was reassuring in the past.  She had  an EEG in December 2018 which was reported as normal. She had neuropsychological evaluation in the past with Dr. Valentina Shaggy on 08/08/2014 with benign results at the time. She also had workup in 2018 for TIA concern. She saw Dr. Bonita Quin, neuropsychologist in the past as well. She had sleep study testing in the form of CPAP titration full night followed by a MSLT the next day in the past. Her  CPAP pressure was adequate pressure of 9 cm during the sleep study, but she had an abnormal next day nap test, in keeping with with idiopathic hypersomnolence. She had a head CT without contrast and MRI brain without contrast on 04/18/2017 with benign results. Of note, she also had a normal brain MRI in July 2015. Her physical and neurological exam have been nonfocal.   I answered all her questions today and she was in agreement.   I spent 30 minutes in total face-to-face time and in reviewing records during pre-charting, more than 50% of which was spent in counseling and coordination of care, reviewing test results, reviewing medications and treatment regimen and/or in discussing or reviewing the diagnosis of OSA, hypersomnolence, the prognosis and treatment options. Pertinent laboratory and imaging test results that were available during this visit with the patient were reviewed by me and considered in my medical decision making (see chart for details).

## 2022-09-23 NOTE — Patient Instructions (Addendum)
It was nice to see you again today. We will stay the course with Adderall 10mg  up to 2 times per day.  Please continue using your CPAP regularly. While your insurance requires that you use CPAP at least 4 hours each night on 70% of the nights, I recommend, that you not skip any nights and use it throughout the night if you can. Getting used to CPAP and staying with the treatment long term does take time and patience and discipline. Untreated obstructive sleep apnea when it is moderate to severe can have an adverse impact on cardiovascular health and raise her risk for heart disease, arrhythmias, hypertension, congestive heart failure, stroke and diabetes. Untreated obstructive sleep apnea causes sleep disruption, nonrestorative sleep, and sleep deprivation. This can have an impact on your day to day functioning and cause daytime sleepiness and impairment of cognitive function, memory loss, mood disturbance, and problems focussing. Using CPAP regularly can improve these symptoms. Follow up in 1 year routinely.

## 2022-09-27 ENCOUNTER — Ambulatory Visit (HOSPITAL_COMMUNITY): Payer: 59 | Admitting: Psychiatry

## 2022-09-27 DIAGNOSIS — F331 Major depressive disorder, recurrent, moderate: Secondary | ICD-10-CM
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
## 2022-09-27 NOTE — Progress Notes (Signed)
57 y.o. G0P0000 Divorced [place] American female here for annual exam.    Patient is on ERT, Premarin 0.625 mg.  Wants to continue estrogen but would like to do a transdermal patch.   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  Wants STD screening.   PCP:   Dr Adrian Prince  Patient's last menstrual period was 06/28/2000.           Sexually active: Yes.    The current method of family planning is status post hysterectomy.    Exercising: Yes.    Home exercise routine includes walking in the park - does this more when the weather is nice. Smoker:  no  Health Maintenance: Pap:  01/04/22 LSIL, few cells suggest HSIL: HR HPV neg, 06/11/21 HSIL: HR HPV neg History of abnormal Pap:  yes MMG:  06/03/22 Breast Density Category C, BI-RADS CAT 1 neg Colonoscopy:  04/25/13 - due this year. BMD:   n/a  Result  n/a TDaP:  03/09/13  Gardasil:   no HIV: 06/11/21 NR Hep C: 06/11/21 NR Screening Labs:  PCP.   reports that she has never smoked. She has never used smokeless tobacco. She reports that she does not drink alcohol and does not use drugs.  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   Diabetes mellitus    Dry eyes, bilateral 2018   Fall at home 05/2018   injured low back/spine/sciatic nerve--had 2 spinal injections   Ketoacidosis 1610,9604   Melasma 01/2020   Meningitis 06/2001   --hospital   Rheumatoid arthritis 2014   Sleep apnea 2013   Sleep apnea 2011   STD (sexually transmitted disease) 6/09   HSV II   Stress    Thyroid disease    hypothyroidism   VAIN I (vaginal intraepithelial neoplasia grade I) 2013    Past Surgical History:  Procedure Laterality Date   ABDOMINAL HYSTERECTOMY     2004   CERVICAL BIOPSY  W/ LOOP ELECTRODE EXCISION  01-24-95   CIN I   COLPOSCOPY  11-21-02   CIN III   COLPOSCOPY  05-05-12   no lesions--needs repeat pap 08/2012 per Dr. Tresa Res   HAND SURGERY  2004    tore tendon   positive HPV  03/21/15   normal pap and negative types 16/18   SOFT TISSUE CYST EXCISION  12/2010   left breast epidermoid inclusion cyst   SPINE SURGERY  05/21/14   TONSILLECTOMY AND ADENOIDECTOMY      Current Outpatient Medications  Medication Sig Dispense Refill   ALPRAZolam (XANAX) 0.25 MG tablet Take 1 tablet (0.25 mg total) by mouth 2 (two) times daily as needed for anxiety. 45 tablet 2   amphetamine-dextroamphetamine (ADDERALL) 10 MG tablet Take 1 tablet (10 mg total) by mouth 2 (two) times daily. 60 tablet 0   Ascorbic Acid (VITAMIN C) 1000 MG tablet Take 1,000 mg by mouth daily.     BD PEN NEEDLE NANO 2ND GEN 32G X 4 MM MISC USE TO ADMINISTER INSULIN 4 TIMES DAILY IF NOT ON INSULIN PUMP     Biotin 10 MG CAPS Take 1 capsule by mouth daily.     buPROPion (WELLBUTRIN XL) 300 MG 24 hr tablet Take 1 tablet (300 mg total) by mouth every morning. 90 tablet 0   Continuous Blood Gluc Sensor (DEXCOM G6 SENSOR) MISC CHANGE EVERY 10 DAYS TO MONITOR BLOOD GLUCOSE CONTINUOUSLY  Continuous Blood Gluc Transmit (DEXCOM G6 TRANSMITTER) MISC CHANGE EVERY 3 MONTHS TO MONITOR BLOOD GLUCOSE CONTINUOUSLY     estradiol (VIVELLE-DOT) 0.05 MG/24HR patch Place 1 patch (0.05 mg total) onto the skin 2 (two) times a week. 8 patch 12   insulin glargine (LANTUS SOLOSTAR) 100 UNIT/ML Solostar Pen 30u once a day Subcutaneous for 30 days     Insulin Human (INSULIN PUMP) SOLN Inject 25 each into the skin as directed. Throughout the day (Per BS)     Insulin Lispro w/ Trans Port 100 UNIT/ML SOPN INJECT AS DIRECTED 3 TIMES A DAY (USE A MAX TOTAL DAILY DOSE OF 30 UNITS) IF INSULIN PUMP FAILURE     Insulin Pen Needle (BD PEN NEEDLE NANO U/F) 32G X 4 MM MISC Use to administer insulin 4 times daily if not on insulin pump     levothyroxine (SYNTHROID) 137 MCG tablet take one tablet but mouth daily, but skip Sunday     Multiple Vitamins-Minerals (QC WOMENS DAILY MULTIVITAMIN) TABS Take one tablet once daily  Oral     venlafaxine XR (EFFEXOR XR) 37.5 MG 24 hr capsule Take three capsule daily 270 capsule 0   Vitamin D, Ergocalciferol, (DRISDOL) 1.25 MG (50000 UNIT) CAPS capsule Take 50,000 Units by mouth once a week.     XIIDRA 5 % SOLN Apply to eye 2 (two) times daily.     No current facility-administered medications for this visit.    Family History  Problem Relation Age of Onset   Other Mother        cysts   Diabetes Father    Kidney disease Father    Stroke Sister    Stroke Paternal Aunt    Breast cancer Neg Hx     Review of Systems  Exam:   BP 132/70 (BP Location: Right Arm, Patient Position: Sitting, Cuff Size: Normal)   Ht 5\' 4"  (1.626 m)   Wt 144 lb (65.3 kg)   LMP 06/28/2000   BMI 24.72 kg/m     General appearance: alert, cooperative and appears stated age Head: normocephalic, without obvious abnormality, atraumatic Neck: no adenopathy, supple, symmetrical, trachea midline and thyroid normal to inspection and palpation Lungs: clear to auscultation bilaterally Breasts: normal appearance, no masses or tenderness, No nipple retraction or dimpling, No nipple discharge or bleeding, No axillary adenopathy Heart: regular rate and rhythm Abdomen: soft, non-tender; no masses, no organomegaly Extremities: extremities normal, atraumatic, no cyanosis or edema Skin: skin color, texture, turgor normal. No rashes or lesions Lymph nodes: cervical, supraclavicular, and axillary nodes normal. Neurologic: grossly normal  Pelvic: External genitalia:  no lesions              No abnormal inguinal nodes palpated.              Urethra:  normal appearing urethra with no masses, tenderness or lesions              Bartholins and Skenes: normal                 Vagina: normal appearing vagina with normal color and discharge, no lesions              Cervix: absent              Pap taken: yes Bimanual Exam:  Uterus:  absent              Adnexa: no mass, fullness, tenderness  Rectal  exam: yes.  Confirms.              Anus:  normal sphincter tone, no lesions  Chaperone was present for exam:  Scherry Ran, CMA  Assessment:   Well woman visit with gynecologic exam. Status post TVH.   History of CIN III and VAIN I.   ERT patient.    HSV.  Not taking antiviral medication. DM. Varicose veins.  Hx hyperpigmentation of the vulva.  Melanocytic macule, lentigo.  Melasma.  STD screening/infectious disease screening.   Plan: Mammogram screening discussed. Self breast awareness reviewed. Pap and HR HPV collected. STD screening. Guidelines for Calcium, Vitamin D, regular exercise program including cardiovascular and weight bearing exercise. We discussed ERT and potential risks of stroke, DVT, and PE.  Will switch to transdermal Vivelle Dot 0.05 mg twice weekly.  #8, RF 12.    TDap. Follow up annually and prn.   After visit summary provided.
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
## 2022-10-11 ENCOUNTER — Ambulatory Visit (INDEPENDENT_AMBULATORY_CARE_PROVIDER_SITE_OTHER): Payer: Managed Care, Other (non HMO) | Admitting: Obstetrics and Gynecology

## 2022-10-11 ENCOUNTER — Other Ambulatory Visit (HOSPITAL_COMMUNITY)
Admission: RE | Admit: 2022-10-11 | Discharge: 2022-10-11 | Disposition: A | Discharge status: Home / Self Care | Payer: 59 | Source: Ambulatory Visit | Attending: Obstetrics and Gynecology | Admitting: Obstetrics and Gynecology

## 2022-10-11 ENCOUNTER — Encounter: Payer: Self-pay | Admitting: Obstetrics and Gynecology

## 2022-10-11 ENCOUNTER — Other Ambulatory Visit (HOSPITAL_COMMUNITY): Payer: Self-pay | Admitting: Psychiatry

## 2022-10-11 VITALS — BP 132/70 | Ht 64.0 in | Wt 144.0 lb

## 2022-10-11 DIAGNOSIS — Z1159 Encounter for screening for other viral diseases: Secondary | ICD-10-CM

## 2022-10-11 DIAGNOSIS — Z113 Encounter for screening for infections with a predominantly sexual mode of transmission: Secondary | ICD-10-CM | POA: Insufficient documentation

## 2022-10-11 DIAGNOSIS — N890 Mild vaginal dysplasia: Secondary | ICD-10-CM | POA: Diagnosis not present

## 2022-10-11 DIAGNOSIS — Z01419 Encounter for gynecological examination (general) (routine) without abnormal findings: Secondary | ICD-10-CM

## 2022-10-11 DIAGNOSIS — F419 Anxiety disorder, unspecified: Secondary | ICD-10-CM

## 2022-10-11 DIAGNOSIS — Z23 Encounter for immunization: Secondary | ICD-10-CM

## 2022-10-11 DIAGNOSIS — Z114 Encounter for screening for human immunodeficiency virus [HIV]: Secondary | ICD-10-CM

## 2022-10-11 MED ORDER — ESTRADIOL 0.05 MG/24HR TD PTTW: 1.0000 | MEDICATED_PATCH | TRANSDERMAL | 12 refills | Status: DC

## 2022-10-11 NOTE — Patient Instructions (Signed)

## 2022-10-12 LAB — HIV ANTIBODY (ROUTINE TESTING W REFLEX): HIV 1&2 Ab, 4th Generation: NONREACTIVE

## 2022-10-12 LAB — HEPATITIS C ANTIBODY: Hepatitis C Ab: NONREACTIVE

## 2022-10-12 LAB — RPR: RPR Ser Ql: NONREACTIVE

## 2022-10-14 ENCOUNTER — Ambulatory Visit (HOSPITAL_COMMUNITY): Payer: 59 | Admitting: Psychiatry

## 2022-10-14 DIAGNOSIS — F331 Major depressive disorder, recurrent, moderate: Secondary | ICD-10-CM

## 2022-10-14 DIAGNOSIS — F419 Anxiety disorder, unspecified: Secondary | ICD-10-CM

## 2022-10-14 DIAGNOSIS — F339 Major depressive disorder, recurrent, unspecified: Secondary | ICD-10-CM

## 2022-10-14 LAB — CYTOLOGY - PAP
Adequacy: ABSENT
Chlamydia: NEGATIVE
Comment: NEGATIVE
Comment: NEGATIVE
Comment: NORMAL
High risk HPV: NEGATIVE
Neisseria Gonorrhea: NEGATIVE
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
## 2022-10-28 ENCOUNTER — Telehealth (HOSPITAL_BASED_OUTPATIENT_CLINIC_OR_DEPARTMENT_OTHER): Payer: 59 | Admitting: Psychiatry

## 2022-10-28 ENCOUNTER — Encounter (HOSPITAL_COMMUNITY): Payer: Self-pay | Admitting: Psychiatry

## 2022-10-28 DIAGNOSIS — F419 Anxiety disorder, unspecified: Secondary | ICD-10-CM | POA: Diagnosis not present

## 2022-10-28 DIAGNOSIS — F331 Major depressive disorder, recurrent, moderate: Secondary | ICD-10-CM | POA: Diagnosis not present

## 2022-10-28 MED ORDER — BUPROPION HCL ER (XL) 300 MG PO TB24
300.0000 mg | ORAL_TABLET | ORAL | 0 refills | Status: DC
Start: 2022-10-28 — End: 2023-01-28

## 2022-10-28 MED ORDER — ALPRAZOLAM 0.25 MG PO TABS
0.2500 mg | ORAL_TABLET | Freq: Two times a day (BID) | ORAL | 2 refills | Status: DC | PRN
Start: 2022-10-28 — End: 2023-05-02

## 2022-10-28 MED ORDER — VENLAFAXINE HCL ER 37.5 MG PO CP24
ORAL_CAPSULE | ORAL | 0 refills | Status: DC
Start: 2022-10-28 — End: 2023-01-28

## 2022-10-28 NOTE — Progress Notes (Signed)
[facility] MD Virtual Progress Note   Patient Location: Home Provider Location: Office  I connect with patient by telephone and verified that I am speaking with correct person by using two identifiers. I discussed the limitations of evaluation and management by telemedicine and the availability of in person appointments. I also discussed with the patient that there may be a patient responsible charge related to this service. The patient expressed understanding and agreed to proceed.  [name] [name] [id number] 57 y.o.  10/28/2022 3:07 PM  History of Present Illness:  Patient is a elevated by phone session.  She reported last week some anxiety due to mother's health condition but now symptoms are manageable.  She is in therapy with Florencia Reasons.  She had cut down her Xanax to take only 1 a day but there are days when she takes 2 a day.  She recall last week she was taking 2 a day but now hoping to go back to 1 a day.  She is taking venlafaxine 112.5 mg.  She denies any crying spells or any feeling of hopelessness or worthlessness.  Her job is a stressful but manageable.  She is looking for another job.  She is working hybrid and days when she works from home is more relaxing.  She sleeps okay.  She denies any suicidal thoughts or homicidal thoughts.  She has no tremors or shakes or any EPS.  She wants to continue her current medication.  Past Psychiatric History: No h/o inpatient treatment or suicidal attempt.  No h/o psychosis, mania, or abuse.  Took Wellbutrin prescribed by PCP.  Dose increased to 450 few years ago.  We tried Lamictal but caused rash.  Neurologist had prescribed Adderall to help her memory.  Her appetite is okay.  Since she cut down her Adderall she noticed some time feeling tired but she is sleeping better.    Outpatient Encounter Medications as of 10/28/2022  Medication Sig   ALPRAZolam (XANAX) 0.25 MG tablet Take 1 tablet (0.25 mg total) by mouth 2 (two) times  daily as needed for anxiety.   amphetamine-dextroamphetamine (ADDERALL) 10 MG tablet Take 1 tablet (10 mg total) by mouth 2 (two) times daily.   Ascorbic Acid (VITAMIN C) 1000 MG tablet Take 1,000 mg by mouth daily.   BD PEN NEEDLE NANO 2ND GEN 32G X 4 MM MISC USE TO ADMINISTER INSULIN 4 TIMES DAILY IF NOT ON INSULIN PUMP   Biotin 10 MG CAPS Take 1 capsule by mouth daily.   buPROPion (WELLBUTRIN XL) 300 MG 24 hr tablet Take 1 tablet (300 mg total) by mouth every morning.   Continuous Blood Gluc Sensor (DEXCOM G6 SENSOR) MISC CHANGE EVERY 10 DAYS TO MONITOR BLOOD GLUCOSE CONTINUOUSLY   Continuous Blood Gluc Transmit (DEXCOM G6 TRANSMITTER) MISC CHANGE EVERY 3 MONTHS TO MONITOR BLOOD GLUCOSE CONTINUOUSLY   estradiol (VIVELLE-DOT) 0.05 MG/24HR patch Place 1 patch (0.05 mg total) onto the skin 2 (two) times a week.   insulin glargine (LANTUS SOLOSTAR) 100 UNIT/ML Solostar Pen 30u once a day Subcutaneous for 30 days   Insulin Human (INSULIN PUMP) SOLN Inject 25 each into the skin as directed. Throughout the day (Per BS)   Insulin Lispro w/ Trans Port 100 UNIT/ML SOPN INJECT AS DIRECTED 3 TIMES A DAY (USE A MAX TOTAL DAILY DOSE OF 30 UNITS) IF INSULIN PUMP FAILURE   Insulin Pen Needle (BD PEN NEEDLE NANO U/F) 32G X 4 MM MISC Use to administer insulin 4 times daily if not  on insulin pump   levothyroxine (SYNTHROID) 137 MCG tablet take one tablet but mouth daily, but skip Sunday   Multiple Vitamins-Minerals (QC WOMENS DAILY MULTIVITAMIN) TABS Take one tablet once daily Oral   venlafaxine XR (EFFEXOR XR) 37.5 MG 24 hr capsule Take three capsule daily   Vitamin D, Ergocalciferol, (DRISDOL) 1.25 MG (50000 UNIT) CAPS capsule Take 50,000 Units by mouth once a week.   XIIDRA 5 % SOLN Apply to eye 2 (two) times daily.   No facility-administered encounter medications on file as of 10/28/2022.    Recent Results (from the past 2160 hour(s))  Cytology - PAP( [place])     Status: Abnormal   Collection Time:  10/11/22  4:32 PM  Result Value Ref Range   High risk HPV Negative    Neisseria Gonorrhea Negative    Chlamydia Negative    Adequacy      Satisfactory for evaluation; transformation zone component ABSENT.   Diagnosis - Low grade squamous intraepithelial lesion (LSIL) (A)    Comment Normal Reference [place] Chlamydia - Negative    Comment      Normal Reference Range Neisseria Gonorrhea - Negative   Comment Normal Reference Range HPV - Negative   RPR     Status: None   Collection Time: 10/11/22  4:51 PM  Result Value Ref Range   RPR Ser Ql NON-REACTIVE NON-REACTIVE    Comment: . No laboratory evidence of syphilis. If recent exposure is suspected, submit a new sample in 2-4 weeks. Marland Kitchen   HIV Antibody (routine testing w rflx)     Status: None   Collection Time: 10/11/22  4:51 PM  Result Value Ref Range   HIV 1&2 Ab, 4th Generation NON-REACTIVE NON-REACTIVE    Comment: HIV-1 antigen and HIV-1/HIV-2 antibodies were not detected. There is no laboratory evidence of HIV infection. Marland Kitchen PLEASE NOTE: This information has been disclosed to you from records whose confidentiality may be protected by state law.  If your state requires such protection, then the state law prohibits you from making any further disclosure of the information without the specific written consent of the person to whom it pertains, or as otherwise permitted by law. A general authorization for the release of medical or other information is NOT sufficient for this purpose. . For additional information please refer to http://education.questdiagn[employer].com/faq/FAQ106 (This link is being provided for informational/ educational purposes only.) . Marland Kitchen The performance of this assay has not been clinically validated in patients less than 87 years old. .   Hepatitis C antibody     Status: None   Collection Time: 10/11/22  4:51 PM  Result Value Ref Range   Hepatitis C Ab NON-REACTIVE NON-REACTIVE    Comment: . HCV antibody  was non-reactive. There is no laboratory  evidence of HCV infection. . In most cases, no further action is required. However, if recent HCV exposure is suspected, a test for HCV RNA (test code 16109) is suggested. . For additional information please refer to http://education.questdiagn[facility].com/faq/FAQ22v1 (This link is being provided for informational/ educational purposes only.) .      Psychiatric Specialty Exam: Physical Exam  Review of Systems  Weight 144 lb (65.3 kg), last menstrual period 06/28/2000.There is no height or weight on file to calculate BMI.  General Appearance: NA  Eye Contact:  NA  Speech:  Normal Rate  Volume:  Normal  Mood:  Anxious  Affect:  NA  Thought Process:  Goal Directed  Orientation:  Full (Time, Place, and Person)  Thought Content:  WDL  Suicidal Thoughts:  No  Homicidal Thoughts:  No  Memory:  Immediate;   Good Recent;   Good Remote;   Good  Judgement:  Good  Insight:  Good  Psychomotor Activity:  NA  Concentration:  Concentration: Good and Attention Span: Good  Recall:  Good  Fund of Knowledge:  Good  Language:  Good  Akathisia:  No  Handed:  Right  AIMS (if indicated):     Assets:  Communication Skills Desire for Improvement Housing Resilience Social Support Talents/Skills Transportation  ADL's:  Intact  Cognition:  WNL  Sleep:  ok     Assessment/Plan: Moderate episode of recurrent major depressive disorder (HCC) - Plan: buPROPion (WELLBUTRIN XL) 300 MG 24 hr tablet, venlafaxine XR (EFFEXOR XR) 37.5 MG 24 hr capsule  Anxiety - Plan: ALPRAZolam (XANAX) 0.25 MG tablet, venlafaxine XR (EFFEXOR XR) 37.5 MG 24 hr capsule  Patient is stable on current medication.  She is trying to wean herself off from Xanax and most of the time she takes 1 pill but there are days when she required second pill.  Encouraged to continue therapy with Florencia Reasons.  Continue venlafaxine 112.5 mg daily and Wellbutrin XL 300 mg daily.  Recommended to  call us back if she has any question or any concern.  Follow-up in 3 months.   Follow Up Instructions:     I discussed the assessment and treatment plan with the patient. The patient was provided an opportunity to ask questions and all were answered. The patient agreed with the plan and demonstrated an understanding of the instructions.   The patient was advised to call back or seek an in-person evaluation if the symptoms worsen or if the condition fails to improve as anticipated.    Collaboration of Care: Other provider involved in patient's care AEB notes are available in epic to review.  Patient/Guardian was advised Release of Information must be obtained prior to any record release in order to collaborate their care with an outside provider. Patient/Guardian was advised if they have not already done so to contact the registration department to sign all necessary forms in order for [place] to release information regarding their care.   Consent: Patient/Guardian gives verbal consent for treatment and assignment of benefits for services provided during this visit. Patient/Guardian expressed understanding and agreed to proceed.     I provided 19 minutes of non face to face time during this encounter.  Note: This document was prepared by [employer] voice dictation technology and any errors that results from this process are unintentional.    Cleotis Nipper, MD 10/28/2022

## 2022-10-31 ENCOUNTER — Other Ambulatory Visit: Payer: Self-pay | Admitting: Obstetrics and Gynecology

## 2022-10-31 DIAGNOSIS — R232 Flushing: Secondary | ICD-10-CM

## 2022-11-01 NOTE — Telephone Encounter (Signed)
Last AEX 10/11/2022--recall placed for 2025. Last mammo 05/2022-neg birads 1  Rx sent for #8 w/ 12 refills on 10/11/2022. Pharmacy requesting rx change for 90 day supply.   Rx pend.

## 2022-11-15 ENCOUNTER — Ambulatory Visit (INDEPENDENT_AMBULATORY_CARE_PROVIDER_SITE_OTHER): Payer: 59 | Admitting: Psychiatry

## 2022-11-15 DIAGNOSIS — F331 Major depressive disorder, recurrent, moderate: Secondary | ICD-10-CM

## 2022-11-15 DIAGNOSIS — F339 Major depressive disorder, recurrent, unspecified: Secondary | ICD-10-CM
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
## 2022-11-19 ENCOUNTER — Other Ambulatory Visit: Payer: Self-pay | Admitting: Neurology

## 2022-11-19 DIAGNOSIS — G4711 Idiopathic hypersomnia with long sleep time: Secondary | ICD-10-CM

## 2022-11-19 DIAGNOSIS — G4733 Obstructive sleep apnea (adult) (pediatric): Secondary | ICD-10-CM

## 2022-11-19 NOTE — Telephone Encounter (Signed)
Pt is requesting a refill for amphetamine-dextroamphetamine (ADDERALL) 10 MG tablet .  Pharmacy: [id number]  

## 2022-11-23 MED ORDER — AMPHETAMINE-DEXTROAMPHETAMINE 10 MG PO TABS: 10.0000 mg | ORAL_TABLET | Freq: Two times a day (BID) | ORAL | 0 refills | Status: DC

## 2022-11-23 NOTE — Telephone Encounter (Signed)
Last visit: 09/23/22  Next visit: 09/08/23  Per [facility], last fill:    Rx refill sent to Dr Frances Furbish to e-scribe

## 2022-12-02 ENCOUNTER — Ambulatory Visit (HOSPITAL_COMMUNITY): Payer: 59 | Admitting: Psychiatry

## 2022-12-02 DIAGNOSIS — F339 Major depressive disorder, recurrent, unspecified: Secondary | ICD-10-CM

## 2022-12-02 DIAGNOSIS — F331 Major depressive disorder, recurrent, moderate: Secondary | ICD-10-CM

## 2022-12-02 DIAGNOSIS — F419 Anxiety disorder, unspecified: Secondary | ICD-10-CM | POA: Diagnosis not present
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
## 2022-12-16 ENCOUNTER — Ambulatory Visit (HOSPITAL_COMMUNITY): Payer: 59 | Admitting: Psychiatry

## 2022-12-16 DIAGNOSIS — F331 Major depressive disorder, recurrent, moderate: Secondary | ICD-10-CM

## 2022-12-16 DIAGNOSIS — F32A Depression, unspecified: Secondary | ICD-10-CM

## 2022-12-16 DIAGNOSIS — F419 Anxiety disorder, unspecified: Secondary | ICD-10-CM
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
## 2022-12-28 ENCOUNTER — Ambulatory Visit (HOSPITAL_COMMUNITY): Payer: 59 | Admitting: Psychiatry

## 2022-12-28 DIAGNOSIS — F331 Major depressive disorder, recurrent, moderate: Secondary | ICD-10-CM | POA: Diagnosis not present
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
## 2023-01-10 ENCOUNTER — Other Ambulatory Visit: Payer: Self-pay | Admitting: Obstetrics and Gynecology

## 2023-01-10 ENCOUNTER — Other Ambulatory Visit (HOSPITAL_COMMUNITY): Payer: Self-pay | Admitting: Psychiatry

## 2023-01-10 ENCOUNTER — Telehealth: Payer: Self-pay

## 2023-01-10 DIAGNOSIS — F331 Major depressive disorder, recurrent, moderate: Secondary | ICD-10-CM

## 2023-01-10 MED ORDER — ESTROGENS CONJUGATED 0.625 MG PO TABS
0.6250 mg | ORAL_TABLET | Freq: Every day | ORAL | 2 refills | Status: DC
Start: 2023-01-10 — End: 2024-04-04

## 2023-01-10 NOTE — Telephone Encounter (Signed)
Pt LVM in triage line stating that she wishes to go back to taking oral estrogen versus the patch due to the patch not staying on for the amt of time that it needs to before the time to replace.   Last AEX 10/11/2022--recall placed for 2025. Last mammo 06/03/2022-neg birads 1  Pt changed from premarin 0.625mg  daily to vivelle dot 0.05mg  patch twice weekly.   Pt confirmed that she is placing patches on clean/dry skin and they are still peeling off prior to when time to exchange.   Please advise.

## 2023-01-10 NOTE — Telephone Encounter (Signed)
 Pt notified and voiced understanding 

## 2023-01-10 NOTE — Telephone Encounter (Signed)
I made the switch back to oral Premarin 0.625 mg daily.  #90, RF 3.   You may close this encounter.

## 2023-01-10 NOTE — Progress Notes (Signed)
Rx discontinued for transdermal estrogen.  New Rx to return to Premarin 0.625 mg daily.  #90, RF 3.

## 2023-01-24 ENCOUNTER — Ambulatory Visit (HOSPITAL_COMMUNITY): Payer: 59 | Admitting: Psychiatry

## 2023-01-28 ENCOUNTER — Encounter (HOSPITAL_COMMUNITY): Payer: Self-pay | Admitting: Psychiatry

## 2023-01-28 ENCOUNTER — Telehealth (HOSPITAL_BASED_OUTPATIENT_CLINIC_OR_DEPARTMENT_OTHER): Payer: 59 | Admitting: Psychiatry

## 2023-01-28 VITALS — Wt 143.0 lb

## 2023-01-28 DIAGNOSIS — F419 Anxiety disorder, unspecified: Secondary | ICD-10-CM

## 2023-01-28 DIAGNOSIS — F331 Major depressive disorder, recurrent, moderate: Secondary | ICD-10-CM

## 2023-01-28 MED ORDER — BUPROPION HCL ER (XL) 300 MG PO TB24
300.0000 mg | ORAL_TABLET | ORAL | 0 refills | Status: DC
Start: 2023-01-28 — End: 2023-05-02

## 2023-01-28 MED ORDER — VENLAFAXINE HCL ER 37.5 MG PO CP24
ORAL_CAPSULE | ORAL | 0 refills | Status: DC
Start: 2023-01-28 — End: 2023-05-02

## 2023-01-28 NOTE — Progress Notes (Addendum)
[facility] MD Virtual Progress Note   Patient Location: Home Provider Location: Home Office  I connect with patient by telephone and verified that I am speaking with correct person by using two identifiers. I discussed the limitations of evaluation and management by telemedicine and the availability of in person appointments. I also discussed with the patient that there may be a patient responsible charge related to this service. The patient expressed understanding and agreed to proceed.  [name] [name] [id number] 57 y.o.  01/28/2023 10:46 AM  History of Present Illness:  Patient is evaluated by phone session.  She reported her anxiety is better and she has cut down her Xanax.  She filled the Xanax in May and has not emptied the bottle and still has refill remaining.  She understands that she need to control her anxiety by breathing exercises walking and the technique.  She reported there are times when she struggled with sleep and racing thoughts but denies any crying spells or any feeling of hopelessness or worthlessness.  Her stress level is her job and lately mother`s gradual decline cognition.  Patient told mother started the remodeling of the bathroom but got stressful as contractor has not able to finish to work on time.  Patient requested a referral for her mother and now mother is seeing Dr. Clyde Lundborg.  Patient has no tremor or shakes or any EPS.  She is taking venlafaxine 112.5 milligram and Wellbutrin XL 300 mg daily.  She wants to continue her current medication.  Past Psychiatric History: No h/o inpatient treatment or suicidal attempt.  No h/o psychosis, mania, or abuse.  Took Wellbutrin prescribed by PCP.  Dose increased to 450 few years ago.  We tried Lamictal but caused rash.  Neurologist had prescribed Adderall to help her memory.  Her appetite is okay.  Since she cut down her Adderall she noticed some time feeling tired but she is sleeping better.    Outpatient  Encounter Medications as of 01/28/2023  Medication Sig   ALPRAZolam (XANAX) 0.25 MG tablet Take 1 tablet (0.25 mg total) by mouth 2 (two) times daily as needed for anxiety.   amphetamine-dextroamphetamine (ADDERALL) 10 MG tablet Take 1 tablet (10 mg total) by mouth 2 (two) times daily.   Ascorbic Acid (VITAMIN C) 1000 MG tablet Take 1,000 mg by mouth daily.   BD PEN NEEDLE NANO 2ND GEN 32G X 4 MM MISC USE TO ADMINISTER INSULIN 4 TIMES DAILY IF NOT ON INSULIN PUMP   Biotin 10 MG CAPS Take 1 capsule by mouth daily.   buPROPion (WELLBUTRIN XL) 300 MG 24 hr tablet Take 1 tablet (300 mg total) by mouth every morning.   Continuous Blood Gluc Sensor (DEXCOM G6 SENSOR) MISC CHANGE EVERY 10 DAYS TO MONITOR BLOOD GLUCOSE CONTINUOUSLY   Continuous Blood Gluc Transmit (DEXCOM G6 TRANSMITTER) MISC CHANGE EVERY 3 MONTHS TO MONITOR BLOOD GLUCOSE CONTINUOUSLY   estrogens, conjugated, (PREMARIN) 0.625 MG tablet Take 1 tablet (0.625 mg total) by mouth daily. Take daily   insulin glargine (LANTUS SOLOSTAR) 100 UNIT/ML Solostar Pen 30u once a day Subcutaneous for 30 days   Insulin Human (INSULIN PUMP) SOLN Inject 25 each into the skin as directed. Throughout the day (Per BS)   Insulin Lispro w/ Trans Port 100 UNIT/ML SOPN INJECT AS DIRECTED 3 TIMES A DAY (USE A MAX TOTAL DAILY DOSE OF 30 UNITS) IF INSULIN PUMP FAILURE   Insulin Pen Needle (BD PEN NEEDLE NANO U/F) 32G X 4 MM MISC Use to administer  insulin 4 times daily if not on insulin pump   levothyroxine (SYNTHROID) 137 MCG tablet take one tablet but mouth daily, but skip Sunday   Multiple Vitamins-Minerals (QC WOMENS DAILY MULTIVITAMIN) TABS Take one tablet once daily Oral   venlafaxine XR (EFFEXOR XR) 37.5 MG 24 hr capsule Take three capsule daily   Vitamin D, Ergocalciferol, (DRISDOL) 1.25 MG (50000 UNIT) CAPS capsule Take 50,000 Units by mouth once a week.   XIIDRA 5 % SOLN Apply to eye 2 (two) times daily.   No facility-administered encounter medications on  file as of 01/28/2023.    No results found for this or any previous visit (from the past 2160 hour(s)).   Psychiatric Specialty Exam: Physical Exam  Review of Systems  Weight 143 lb (64.9 kg), last menstrual period 06/28/2000.There is no height or weight on file to calculate BMI.  General Appearance: NA  Eye Contact:  NA  Speech:  Normal Rate  Volume:  Normal  Mood:  Euthymic  Affect:  NA  Thought Process:  Goal Directed  Orientation:  Full (Time, Place, and Person)  Thought Content:  WDL  Suicidal Thoughts:  No  Homicidal Thoughts:  No  Memory:  Immediate;   Good Recent;   Good Remote;   Good  Judgement:  Good  Insight:  Present  Psychomotor Activity:  NA  Concentration:  Concentration: Good and Attention Span: Good  Recall:  Good  Fund of Knowledge:  Good  Language:  Good  Akathisia:  No  Handed:  Right  AIMS (if indicated):     Assets:  Communication Skills Desire for Improvement Housing Resilience Transportation  ADL's:  Intact  Cognition:  WNL  Sleep:  fair     Assessment/Plan: Moderate episode of recurrent major depressive disorder (HCC) - Plan: buPROPion (WELLBUTRIN XL) 300 MG 24 hr tablet, venlafaxine XR (EFFEXOR XR) 37.5 MG 24 hr capsule  Anxiety - Plan: venlafaxine XR (EFFEXOR XR) 37.5 MG 24 hr capsule  Patient is stable on current medication.  She cut down her Xanax only taking as needed.  She still has medication remaining from May and she also has refill remaining on Xanax.  She like to come off from benzodiazepine.  I discussed that sometimes insomnia can be triggered by stimulant and she is taking Adderall.  She is taking Adderall for narcolepsy.  I recommended discussed with the neurologist if there is another option versus narcolepsy.  Continue Wellbutrin XL 3 mg daily and venlafaxine 112.5 mg daily.  Encouraged to continue therapy with Obie Dredge.  Recommend call back if any question or any concern.  Follow-up in 3 months   Follow Up  Instructions:     I discussed the assessment and treatment plan with the patient. The patient was provided an opportunity to ask questions and all were answered. The patient agreed with the plan and demonstrated an understanding of the instructions.   The patient was advised to call back or seek an in-person evaluation if the symptoms worsen or if the condition fails to improve as anticipated.    Collaboration of Care: Other provider involved in patient's care AEB in epic to review  Patient/Guardian was advised Release of Information must be obtained prior to any record release in order to collaborate their care with an outside provider. Patient/Guardian was advised if they have not already done so to contact the registration department to sign all necessary forms in order for [place] to release information regarding their care.   Consent: Patient/Guardian gives  verbal consent for treatment and assignment of benefits for services provided during this visit. Patient/Guardian expressed understanding and agreed to proceed.     I provided 20 minutes of non face to face time during this encounter.  Note: This document was prepared by [employer] voice dictation technology and any errors that results from this process are unintentional.    Cleotis Nipper, MD 01/28/2023

## 2023-02-07 ENCOUNTER — Ambulatory Visit (HOSPITAL_COMMUNITY): Payer: 59 | Admitting: Psychiatry

## 2023-02-07 DIAGNOSIS — F329 Major depressive disorder, single episode, unspecified: Secondary | ICD-10-CM

## 2023-02-07 DIAGNOSIS — F331 Major depressive disorder, recurrent, moderate: Secondary | ICD-10-CM

## 2023-02-07 DIAGNOSIS — F419 Anxiety disorder, unspecified: Secondary | ICD-10-CM | POA: Diagnosis not present
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
## 2023-02-14 ENCOUNTER — Other Ambulatory Visit: Payer: Self-pay | Admitting: Neurology

## 2023-02-14 DIAGNOSIS — G4711 Idiopathic hypersomnia with long sleep time: Secondary | ICD-10-CM

## 2023-02-14 DIAGNOSIS — G4733 Obstructive sleep apnea (adult) (pediatric): Secondary | ICD-10-CM

## 2023-02-14 MED ORDER — AMPHETAMINE-DEXTROAMPHETAMINE 10 MG PO TABS
10.0000 mg | ORAL_TABLET | Freq: Two times a day (BID) | ORAL | 0 refills | Status: DC
Start: 2023-02-14 — End: 2023-05-09

## 2023-02-14 NOTE — Telephone Encounter (Signed)
Sent refill request to Dr. Frances Furbish in  am

## 2023-02-14 NOTE — Telephone Encounter (Signed)
Pt request refill for amphetamine-dextroamphetamine (ADDERALL) 10 MG tablet at  [facility] [phone]

## 2023-02-24 ENCOUNTER — Ambulatory Visit (INDEPENDENT_AMBULATORY_CARE_PROVIDER_SITE_OTHER): Payer: 59 | Admitting: Psychiatry

## 2023-02-24 DIAGNOSIS — F331 Major depressive disorder, recurrent, moderate: Secondary | ICD-10-CM

## 2023-02-24 DIAGNOSIS — F339 Major depressive disorder, recurrent, unspecified: Secondary | ICD-10-CM | POA: Diagnosis not present
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
## 2023-03-17 ENCOUNTER — Ambulatory Visit (HOSPITAL_COMMUNITY): Payer: 59 | Admitting: Psychiatry

## 2023-04-04 ENCOUNTER — Ambulatory Visit (HOSPITAL_COMMUNITY): Payer: 59 | Admitting: Psychiatry

## 2023-04-04 DIAGNOSIS — F331 Major depressive disorder, recurrent, moderate: Secondary | ICD-10-CM

## 2023-04-04 DIAGNOSIS — F419 Anxiety disorder, unspecified: Secondary | ICD-10-CM | POA: Diagnosis not present
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
## 2023-04-18 ENCOUNTER — Ambulatory Visit (HOSPITAL_COMMUNITY): Payer: 59 | Admitting: Psychiatry

## 2023-04-18 DIAGNOSIS — F419 Anxiety disorder, unspecified: Secondary | ICD-10-CM | POA: Diagnosis not present

## 2023-04-18 DIAGNOSIS — F329 Major depressive disorder, single episode, unspecified: Secondary | ICD-10-CM | POA: Diagnosis not present

## 2023-04-18 DIAGNOSIS — F331 Major depressive disorder, recurrent, moderate: Secondary | ICD-10-CM
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
## 2023-05-02 ENCOUNTER — Encounter (HOSPITAL_COMMUNITY): Payer: Self-pay | Admitting: Psychiatry

## 2023-05-02 ENCOUNTER — Telehealth (HOSPITAL_BASED_OUTPATIENT_CLINIC_OR_DEPARTMENT_OTHER): Payer: 59 | Admitting: Psychiatry

## 2023-05-02 DIAGNOSIS — F331 Major depressive disorder, recurrent, moderate: Secondary | ICD-10-CM

## 2023-05-02 DIAGNOSIS — F419 Anxiety disorder, unspecified: Secondary | ICD-10-CM

## 2023-05-02 MED ORDER — BUPROPION HCL ER (XL) 300 MG PO TB24: 300.0000 mg | ORAL_TABLET | ORAL | 0 refills | Status: DC

## 2023-05-02 MED ORDER — ALPRAZOLAM 0.25 MG PO TABS
0.2500 mg | ORAL_TABLET | Freq: Two times a day (BID) | ORAL | 2 refills | Status: DC | PRN
Start: 2023-05-02 — End: 2023-12-02

## 2023-05-02 MED ORDER — VENLAFAXINE HCL ER 37.5 MG PO CP24: ORAL_CAPSULE | ORAL | 0 refills | Status: DC

## 2023-05-02 NOTE — Progress Notes (Signed)
[name] [name]   Patient Location: Home Provider Location: Office  I connect with patient by video and verified that I am speaking with correct person by using two identifiers. I discussed the limitations of evaluation and management by telemedicine and the availability of in person appointments. I also discussed with the patient that there may be a patient responsible charge related to this service. The patient expressed understanding and agreed to proceed.  [name] [name] [id number] 57 y.o.  05/02/2023 10:41 AM  History of Present Illness:  Patient is evaluated by video session.  She reported increased stress lately and taking the Xanax on a daily basis.  She reported mother is in rehab after having infection and could not function.  She is in the rehab for 2 weeks and about to release in few days.  Patient reported concern about her mother and initially she thought that she is having Alzheimer's but found out that she was taking too many medication and they have cut down the medicines.  Patient is in therapy with Florencia Reasons.  She reported job is still challenging but manageable.  She reported racing thoughts before sleep.  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  Patient told that she is on a hold for job research for now because she like to get her mother situated who may need higher level of care.  Patient told recently mother renovated the kitchen and not sure if she had infection from mold because Wall was opened and found mold.  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  She denies any anger, mood swing, feeling of hopelessness or worthlessness.  She reported combination of Wellbutrin, venlafaxine and Xanax is keeping her stable.  She realized that she need to get some help for her mother because sometimes she feels burnout  taking care of her 67 year old mother.  Patient told her mother lives by herself but not sure if she can go back to the same place at this time.  Patient denies any tremors, shakes or any EPS.  Her appetite is okay.  Her weight is stable.  She denies any major panic attack.  Past Psychiatric History: No h/o inpatient treatment or suicidal attempt.  No h/o psychosis, mania, or abuse.  Took Wellbutrin prescribed by PCP.  Dose increased to 450 few years ago.  We tried Lamictal but caused rash.  Neurologist had prescribed Adderall to help her memory.  Her appetite is okay.  Since she cut down her Adderall she noticed some time feeling tired but she is sleeping better.    Outpatient Encounter Medications as of 05/02/2023  Medication Sig   ALPRAZolam (XANAX) 0.25 MG tablet Take 1 tablet (0.25 mg total) by mouth 2 (two) times daily as needed for anxiety.   amphetamine-dextroamphetamine (ADDERALL) 10 MG tablet Take 1 tablet (10 mg total) by mouth 2 (two) times daily.   Ascorbic Acid (VITAMIN C) 1000 MG tablet Take 1,000 mg by mouth daily.   BD PEN NEEDLE NANO 2ND GEN 32G X 4 MM MISC USE TO ADMINISTER INSULIN 4 TIMES DAILY IF NOT ON INSULIN PUMP   Biotin 10 MG CAPS Take 1 capsule by mouth daily.   buPROPion (WELLBUTRIN XL) 300 MG 24 hr tablet Take 1 tablet (300 mg total) by mouth every morning.   Continuous Blood Gluc Sensor (DEXCOM G6 SENSOR)  MISC CHANGE EVERY 10 DAYS TO MONITOR BLOOD GLUCOSE CONTINUOUSLY   Continuous Blood Gluc Transmit (DEXCOM G6 TRANSMITTER) MISC CHANGE EVERY 3 MONTHS TO MONITOR BLOOD GLUCOSE CONTINUOUSLY   estrogens, conjugated, (PREMARIN) 0.625 MG tablet Take 1 tablet (0.625 mg total) by mouth daily. Take daily   insulin glargine (LANTUS SOLOSTAR) 100 UNIT/ML Solostar Pen 30u once a day Subcutaneous for 30 days   Insulin Human (INSULIN PUMP) SOLN Inject 25 each into the skin as directed. Throughout the day (Per BS)   Insulin Lispro w/ Trans Port 100 UNIT/ML SOPN INJECT AS DIRECTED 3  TIMES A DAY (USE A MAX TOTAL DAILY DOSE OF 30 UNITS) IF INSULIN PUMP FAILURE   Insulin Pen Needle (BD PEN NEEDLE NANO U/F) 32G X 4 MM MISC Use to administer insulin 4 times daily if not on insulin pump   levothyroxine (SYNTHROID) 137 MCG tablet take one tablet but mouth daily, but skip Sunday   Multiple Vitamins-Minerals (QC WOMENS DAILY MULTIVITAMIN) TABS Take one tablet once daily Oral   venlafaxine XR (EFFEXOR XR) 37.5 MG 24 hr capsule Take three capsule daily   Vitamin D, Ergocalciferol, (DRISDOL) 1.25 MG (50000 UNIT) CAPS capsule Take 50,000 Units by mouth once a week.   XIIDRA 5 % SOLN Apply to eye 2 (two) times daily.   No facility-administered encounter medications on file as of 05/02/2023.    No results found for this or any previous visit (from the past 2160 hour(s)).   Psychiatric Specialty Exam: Physical Exam  Review of Systems  Weight 141 lb (64 kg), last menstrual period 06/28/2000.There is no height or weight on file to calculate BMI.  General Appearance: Casual  Eye Contact:  Good  Speech:  Clear and Coherent and Normal Rate  Volume:  Normal  Mood:  Anxious  Affect:  Congruent  Thought Process:  Goal Directed  Orientation:  Full (Time, Place, and Person)  Thought Content:  Logical  Suicidal Thoughts:  No  Homicidal Thoughts:  No  Memory:  Immediate;   Good Recent;   Good Remote;   Good  Judgement:  Intact  Insight:  Good  Psychomotor Activity:  Normal  Concentration:  Concentration: Good and Attention Span: Good  Recall:  Good  Fund of Knowledge:  Good  Language:  Good  Akathisia:  No  Handed:  Right  AIMS (if indicated):     Assets:  Systems developer  ADL's:  Intact  Cognition:  WNL  Sleep:  6 hrs     Assessment/Plan: Anxiety - Plan: ALPRAZolam (XANAX) 0.25 MG tablet, venlafaxine XR (EFFEXOR XR) 37.5 MG 24 hr capsule  Moderate episode of recurrent major depressive disorder  (HCC) - Plan: buPROPion (WELLBUTRIN XL) 300 MG 24 hr tablet, venlafaxine XR (EFFEXOR XR) 37.5 MG 24 hr capsule  Discussed stress coming from [employer] [facility], job and feels sometime burnout.  She is in the process of getting some help for her mother or may need assisted living facility.  She does not want to change the medication because she feel currently they are working.  Her long-term plan is to come off from benzodiazepine but now she needed to take as prescribed until her stress level go down.  She is taking Adderall from neurology from narcolepsy.  I encouraged again to discuss with the neurologist to try a different medication as sometimes she feel tired during the day and having racing thoughts at bedtime.  I explained it could be stimulant effect.  Patient agreed  with the plan.  No change of the medication.  Encouraged to continue therapy with Florencia Reasons.  Recommend to call us back if she has any question or any concern.  Follow-up in 3 months.  Brief psychotherapy given to the patient   Follow Up Instructions:     I discussed the assessment and treatment plan with the patient. The patient was provided an opportunity to ask questions and all were answered. The patient agreed with the plan and demonstrated an understanding of the instructions.   The patient was advised to call back or seek an in-person evaluation if the symptoms worsen or if the condition fails to improve as anticipated.    Collaboration of Care: Other provider involved in patient's care AEB notes are available in epic to review  Patient/Guardian was advised Release of Information must be obtained prior to any record release in order to collaborate their care with an outside provider. Patient/Guardian was advised if they have not already done so to contact the registration department to sign all necessary forms in order for [place] to release information regarding their care.   Consent: Patient/Guardian gives verbal consent for  treatment and assignment of benefits for services provided during this visit. Patient/Guardian expressed understanding and agreed to proceed.     I provided 23 minutes of non face to face time during this encounter.  [name]: This document was prepared by [employer] voice dictation technology and any errors that results from this process are unintentional.    Cleotis Nipper, MD 05/02/2023

## 2023-05-05 ENCOUNTER — Ambulatory Visit (HOSPITAL_COMMUNITY): Payer: 59 | Admitting: Psychiatry

## 2023-05-05 DIAGNOSIS — F419 Anxiety disorder, unspecified: Secondary | ICD-10-CM

## 2023-05-05 DIAGNOSIS — F331 Major depressive disorder, recurrent, moderate: Secondary | ICD-10-CM | POA: Diagnosis not present
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
## 2023-05-09 ENCOUNTER — Other Ambulatory Visit: Payer: Self-pay | Admitting: Neurology

## 2023-05-09 DIAGNOSIS — G4711 Idiopathic hypersomnia with long sleep time: Secondary | ICD-10-CM

## 2023-05-09 DIAGNOSIS — G4733 Obstructive sleep apnea (adult) (pediatric): Secondary | ICD-10-CM

## 2023-05-09 MED ORDER — AMPHETAMINE-DEXTROAMPHETAMINE 10 MG PO TABS: 10.0000 mg | ORAL_TABLET | Freq: Two times a day (BID) | ORAL | 0 refills | Status: DC

## 2023-05-09 NOTE — Telephone Encounter (Signed)
Last seen 09-23-2022, last refill 02-14-2023 #[id number].  Next appt 09-08-2022 at 1315.

## 2023-05-09 NOTE — Telephone Encounter (Signed)
Pt is requesting a refill for amphetamine-dextroamphetamine (ADDERALL) 10 MG tablet .  Pharmacy: [id number]  

## 2023-05-25 ENCOUNTER — Ambulatory Visit (HOSPITAL_COMMUNITY): Payer: 59 | Admitting: Psychiatry

## 2023-05-25 DIAGNOSIS — F331 Major depressive disorder, recurrent, moderate: Secondary | ICD-10-CM | POA: Diagnosis not present
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
## 2023-06-21 ENCOUNTER — Ambulatory Visit (HOSPITAL_COMMUNITY): Payer: 59 | Admitting: Psychiatry

## 2023-06-21 DIAGNOSIS — F331 Major depressive disorder, recurrent, moderate: Secondary | ICD-10-CM | POA: Diagnosis not present
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
## 2023-06-24 ENCOUNTER — Ambulatory Visit (HOSPITAL_COMMUNITY): Payer: 59 | Admitting: Psychiatry

## 2023-07-07 ENCOUNTER — Ambulatory Visit (INDEPENDENT_AMBULATORY_CARE_PROVIDER_SITE_OTHER): Payer: 59 | Admitting: Psychiatry

## 2023-07-07 DIAGNOSIS — F331 Major depressive disorder, recurrent, moderate: Secondary | ICD-10-CM

## 2023-07-07 DIAGNOSIS — F419 Anxiety disorder, unspecified: Secondary | ICD-10-CM
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
## 2023-07-28 ENCOUNTER — Other Ambulatory Visit: Payer: Self-pay | Admitting: Neurology

## 2023-07-28 ENCOUNTER — Ambulatory Visit (INDEPENDENT_AMBULATORY_CARE_PROVIDER_SITE_OTHER): Payer: 59 | Admitting: Psychiatry

## 2023-07-28 DIAGNOSIS — F331 Major depressive disorder, recurrent, moderate: Secondary | ICD-10-CM | POA: Diagnosis not present

## 2023-07-28 DIAGNOSIS — G4733 Obstructive sleep apnea (adult) (pediatric): Secondary | ICD-10-CM

## 2023-07-28 DIAGNOSIS — F419 Anxiety disorder, unspecified: Secondary | ICD-10-CM | POA: Diagnosis not present

## 2023-07-28 DIAGNOSIS — G4711 Idiopathic hypersomnia with long sleep time: Secondary | ICD-10-CM

## 2023-07-28 MED ORDER — AMPHETAMINE-DEXTROAMPHETAMINE 10 MG PO TABS: 10.0000 mg | ORAL_TABLET | Freq: Two times a day (BID) | ORAL | 0 refills | Status: DC

## 2023-07-28 NOTE — Telephone Encounter (Signed)
Pt is requesting a refill for amphetamine-dextroamphetamine (ADDERALL) 10 MG tablet .  Pharmacy: [facility] [phone]
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
## 2023-07-28 NOTE — Telephone Encounter (Signed)
Last visit: 09/23/22  Next visit:09/08/23  Per epic adherence report, last fill:  Rx refill request sent to Dr Frances Furbish to approve.

## 2023-08-01 ENCOUNTER — Telehealth (HOSPITAL_BASED_OUTPATIENT_CLINIC_OR_DEPARTMENT_OTHER): Payer: 59 | Admitting: Psychiatry

## 2023-08-01 ENCOUNTER — Encounter (HOSPITAL_COMMUNITY): Payer: Self-pay | Admitting: Psychiatry

## 2023-08-01 DIAGNOSIS — F331 Major depressive disorder, recurrent, moderate: Secondary | ICD-10-CM

## 2023-08-01 DIAGNOSIS — F419 Anxiety disorder, unspecified: Secondary | ICD-10-CM | POA: Diagnosis not present

## 2023-08-01 MED ORDER — BUPROPION HCL ER (XL) 300 MG PO TB24: 300.0000 mg | ORAL_TABLET | ORAL | 0 refills | Status: DC

## 2023-08-01 NOTE — Progress Notes (Addendum)
 [facility] MD Virtual Progress Note   Patient Location: Home Provider Location: Home Office  I connect with patient by video and verified that I am speaking with correct person by using two identifiers. I discussed the limitations of evaluation and management by telemedicine and the availability of in person appointments. I also discussed with the patient that there may be a patient responsible charge related to this service. The patient expressed understanding and agreed to proceed.  [name] [name] [id number] 58 y.o.  08/01/2023 4:25 PM  History of Present Illness:  Patient is evaluated by video session.  She continues to cut down her Xanax  and now only taking as needed.  She has not finished her bottle and still has 1 refill remaining.  She admitted some time anxious and nervousness because of her mother.  Her 42 year old mother some time very demanding and challenging but so far she is able to handle things.  She is in therapy with Fayne Hoover.  She reported her anxiety is sometimes situational.  Work is very busy and stressful but manageable.  Final evaluation is complete and she is going to have the final inspection coming soon.  Recently she has seen GI and given medication for GERD.  She is not sure if it is making her tired her symptoms she noticed fatigue.  She denies any hallucination, paranoia, suicidal thoughts.  Her appetite is okay.  Her energy level is fair.  She wants to continue the combination of Wellbutrin  and Xanax  as needed.  She is taking her narcolepsy medication Adderall.  She sleeps 6 to 7 hours with the help of CPAP.  She has no tremor or shakes or any EPS.  Past Psychiatric History: No h/o inpatient treatment or suicidal attempt.  No h/o psychosis, mania, or abuse.  Took Wellbutrin  prescribed by PCP.  Dose increased to 450 few years ago.  We tried Lamictal  but caused rash.  Neurologist had prescribed Adderall to help her memory.  Her appetite is okay.  Since  she cut down her Adderall she noticed some time feeling tired but she is sleeping better.    Outpatient Encounter Medications as of 08/01/2023  Medication Sig   ALPRAZolam  (XANAX ) 0.25 MG tablet Take 1 tablet (0.25 mg total) by mouth 2 (two) times daily as needed for anxiety.   amphetamine -dextroamphetamine  (ADDERALL) 10 MG tablet Take 1 tablet (10 mg total) by mouth 2 (two) times daily.   Ascorbic Acid (VITAMIN C) 1000 MG tablet Take 1,000 mg by mouth daily.   BD PEN NEEDLE NANO 2ND GEN 32G X 4 MM MISC USE TO ADMINISTER INSULIN 4 TIMES DAILY IF NOT ON INSULIN PUMP   Biotin 10 MG CAPS Take 1 capsule by mouth daily.   buPROPion  (WELLBUTRIN  XL) 300 MG 24 hr tablet Take 1 tablet (300 mg total) by mouth every morning.   Continuous Blood Gluc Sensor (DEXCOM G6 SENSOR) MISC CHANGE EVERY 10 DAYS TO MONITOR BLOOD GLUCOSE CONTINUOUSLY   Continuous Blood Gluc Transmit (DEXCOM G6 TRANSMITTER) MISC CHANGE EVERY 3 MONTHS TO MONITOR BLOOD GLUCOSE CONTINUOUSLY   estrogens , conjugated, (PREMARIN ) 0.625 MG tablet Take 1 tablet (0.625 mg total) by mouth daily. Take daily   insulin glargine (LANTUS SOLOSTAR) 100 UNIT/ML Solostar Pen 30u once a day Subcutaneous for 30 days   Insulin Human (INSULIN PUMP) SOLN Inject 25 each into the skin as directed. Throughout the day (Per BS)   Insulin Lispro w/ Trans Port 100 UNIT/ML SOPN INJECT AS DIRECTED 3 TIMES A DAY (USE  A MAX TOTAL DAILY DOSE OF 30 UNITS) IF INSULIN PUMP FAILURE   Insulin Pen Needle (BD PEN NEEDLE NANO U/F) 32G X 4 MM MISC Use to administer insulin 4 times daily if not on insulin pump   levothyroxine (SYNTHROID) 137 MCG tablet take one tablet but mouth daily, but skip Sunday   Multiple Vitamins-Minerals (QC WOMENS DAILY MULTIVITAMIN) TABS Take one tablet once daily Oral   venlafaxine  XR (EFFEXOR  XR) 37.5 MG 24 hr capsule Take three capsule daily   Vitamin D , Ergocalciferol , (DRISDOL) 1.25 MG (50000 UNIT) CAPS capsule Take 50,000 Units by mouth once a week.    XIIDRA 5 % SOLN Apply to eye 2 (two) times daily.   No facility-administered encounter medications on file as of 08/01/2023.    No results found for this or any previous visit (from the past 2160 hours).   Psychiatric Specialty Exam: Physical Exam  Review of Systems  Weight 141 lb (64 kg), last menstrual period 06/28/2000.There is no height or weight on file to calculate BMI.  General Appearance: Casual  Eye Contact:  Good  Speech:  Normal Rate  Volume:  Normal  Mood:  Anxious  Affect:  Appropriate  Thought Process:  Goal Directed  Orientation:  Full (Time, Place, and Person)  Thought Content:  WDL  Suicidal Thoughts:  No  Homicidal Thoughts:  No  Memory:  Immediate;   Good Recent;   Good Remote;   Good  Judgement:  Intact  Insight:  Present  Psychomotor Activity:  Normal  Concentration:  Concentration: Good and Attention Span: Good  Recall:  Good  Fund of Knowledge:  Good  Language:  Good  Akathisia:  No  Handed:  Right  AIMS (if indicated):     Assets:  Communication Skills Desire for Improvement Housing Physical Health Social Support Talents/Skills Transportation  ADL's:  Intact  Cognition:  WNL  Sleep:  6-7 hrs with CPAP     Assessment/Plan: Anxiety  Moderate episode of recurrent major depressive disorder (HCC) - Plan: buPROPion  (WELLBUTRIN  XL) 300 MG 24 hr tablet  Patient is stable on current medication.  Discussed family stress as some time mother's health is challenging.  Encouraged to continue therapy with Fayne Hoover.  Continue Wellbutrin  XL 300 mg daily.  She has cut down her Xanax  a lot and her long-term plan is to stop the benzodiazepine.  Encouraged to call us  back if she has any question or any concern.  She is taking Adderall from neurology to help her narcolepsy.  Recommended to call us  back if she is any question or any concern.  Follow-up in 3 months.  I also encouraged to contact her GI if new medicine causing tiredness.   Follow Up  Instructions:     I discussed the assessment and treatment plan with the patient. The patient was provided an opportunity to ask questions and all were answered. The patient agreed with the plan and demonstrated an understanding of the instructions.   The patient was advised to call back or seek an in-person evaluation if the symptoms worsen or if the condition fails to improve as anticipated.    Collaboration of Care: Other provider involved in patient's care AEB notes are available in epic to review  Patient/Guardian was advised Release of Information must be obtained prior to any record release in order to collaborate their care with an outside provider. Patient/Guardian was advised if they have not already done so to contact the registration department to sign all necessary forms  in order for us  to release information regarding their care.   Consent: Patient/Guardian gives verbal consent for treatment and assignment of benefits for services provided during this visit. Patient/Guardian expressed understanding and agreed to proceed.     I provided 18 minutes of non face to face time during this encounter.  Note: This document was prepared by [employer] voice dictation technology and any errors that results from this process are unintentional.    Arturo Late, MD 08/01/2023

## 2023-08-11 ENCOUNTER — Ambulatory Visit (HOSPITAL_COMMUNITY): Payer: 59 | Admitting: Psychiatry

## 2023-08-29 ENCOUNTER — Telehealth (HOSPITAL_COMMUNITY): Payer: Self-pay | Admitting: Psychiatry

## 2023-08-29 ENCOUNTER — Ambulatory Visit (HOSPITAL_COMMUNITY): Payer: 59 | Admitting: Psychiatry

## 2023-08-29 DIAGNOSIS — F331 Major depressive disorder, recurrent, moderate: Secondary | ICD-10-CM | POA: Diagnosis not present
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
## 2023-09-08 ENCOUNTER — Ambulatory Visit: Payer: Managed Care, Other (non HMO) | Admitting: Neurology

## 2023-09-08 DIAGNOSIS — Z0289 Encounter for other administrative examinations: Secondary | ICD-10-CM

## 2023-09-12 ENCOUNTER — Ambulatory Visit (INDEPENDENT_AMBULATORY_CARE_PROVIDER_SITE_OTHER): Payer: 59 | Admitting: Psychiatry

## 2023-09-12 DIAGNOSIS — F329 Major depressive disorder, single episode, unspecified: Secondary | ICD-10-CM

## 2023-09-12 DIAGNOSIS — F419 Anxiety disorder, unspecified: Secondary | ICD-10-CM | POA: Diagnosis not present

## 2023-09-12 DIAGNOSIS — F331 Major depressive disorder, recurrent, moderate: Secondary | ICD-10-CM
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
## 2023-10-03 ENCOUNTER — Other Ambulatory Visit: Payer: Self-pay | Admitting: Neurology

## 2023-10-03 DIAGNOSIS — G4733 Obstructive sleep apnea (adult) (pediatric): Secondary | ICD-10-CM

## 2023-10-03 DIAGNOSIS — G4711 Idiopathic hypersomnia with long sleep time: Secondary | ICD-10-CM

## 2023-10-03 MED ORDER — AMPHETAMINE-DEXTROAMPHETAMINE 10 MG PO TABS: 10.0000 mg | ORAL_TABLET | Freq: Two times a day (BID) | ORAL | 0 refills | Status: DC

## 2023-10-03 NOTE — Telephone Encounter (Signed)
 Last visit was 09/23/22. Pt rescheduled her March visit this year due to possible flu. Next appt is in July. Will send Rx request to Dr Frances Furbish for review and approval if ok.   Per epic adherence report, last filled:

## 2023-10-03 NOTE — Telephone Encounter (Signed)
 Pt is requesting a refill for amphetamine-dextroamphetamine (ADDERALL) 10 MG tablet .  Pharmacy: [facility] [phone]

## 2023-10-06 ENCOUNTER — Ambulatory Visit (HOSPITAL_COMMUNITY): Payer: 59 | Admitting: Psychiatry

## 2023-10-06 DIAGNOSIS — F331 Major depressive disorder, recurrent, moderate: Secondary | ICD-10-CM
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
## 2023-10-22 ENCOUNTER — Other Ambulatory Visit (HOSPITAL_COMMUNITY): Payer: Self-pay | Admitting: Psychiatry

## 2023-10-22 DIAGNOSIS — F331 Major depressive disorder, recurrent, moderate: Secondary | ICD-10-CM

## 2023-10-22 DIAGNOSIS — F419 Anxiety disorder, unspecified: Secondary | ICD-10-CM

## 2023-10-23 ENCOUNTER — Encounter (HOSPITAL_BASED_OUTPATIENT_CLINIC_OR_DEPARTMENT_OTHER): Payer: Self-pay | Admitting: Emergency Medicine

## 2023-10-23 ENCOUNTER — Other Ambulatory Visit: Payer: Self-pay

## 2023-10-23 ENCOUNTER — Emergency Department (HOSPITAL_BASED_OUTPATIENT_CLINIC_OR_DEPARTMENT_OTHER): Admission: EM | Admit: 2023-10-23 | Discharge: 2023-10-23 | Disposition: A | Discharge status: Home / Self Care

## 2023-10-23 DIAGNOSIS — R7309 Other abnormal glucose: Secondary | ICD-10-CM | POA: Insufficient documentation

## 2023-10-23 NOTE — ED Triage Notes (Signed)
 Pt had continuous glucose monitor sensor implanted in her upper right arm that failed on Friday - pt removed failed sensor and a piece of the sensor remains in her right upper arm and she believes it to be interfering with her daily readings from the new sensor she inserted.  Pt h/o type 1 IDDM with pump.    AAOx4, NAD in triage.

## 2023-10-23 NOTE — ED Notes (Signed)
 ED Provider at bedside.

## 2023-10-23 NOTE — ED Provider Notes (Signed)
 [place] EMERGENCY DEPARTMENT AT [facility] Provider Note   CSN: [id number] Arrival date & time: 10/23/23  1703     History  Chief Complaint  Patient presents with   Foreign Body in Skin    [name] [name] is a 58 y.o. female.  58 year old female presenting emergency department with concern for foreign body in her right upper extremity.  Has continuous glucose monitor that failed Friday which she removed.  It did not have the needle in the device, she is concerned that it remains in her arm.  No pain, fevers, chills, does not recall precisely what device was placed.  She has had some connectivity issues with her current monitor and thinks that the foreign body may be interfering with that.        Home Medications Prior to Admission medications   Medication Sig Start Date End Date Taking? Authorizing Provider  ALPRAZolam  (XANAX ) 0.25 MG tablet Take 1 tablet (0.25 mg total) by mouth 2 (two) times daily as needed for anxiety. 05/02/23   Arfeen, Bronson Canny, MD  amphetamine -dextroamphetamine  (ADDERALL) 10 MG tablet Take 1 tablet (10 mg total) by mouth 2 (two) times daily. 10/03/23   Debbra Fairy, MD  Ascorbic Acid (VITAMIN C) 1000 MG tablet Take 1,000 mg by mouth daily.    [provider]  BD PEN NEEDLE NANO 2ND GEN 32G X 4 MM MISC USE TO ADMINISTER INSULIN 4 TIMES DAILY IF NOT ON INSULIN PUMP 08/07/21   [provider]  Biotin 10 MG CAPS Take 1 capsule by mouth daily.    [provider]  buPROPion  (WELLBUTRIN  XL) 300 MG 24 hr tablet Take 1 tablet (300 mg total) by mouth every morning. 08/01/23 10/30/23  Arfeen, Bronson Canny, MD  Continuous Blood Gluc Sensor (DEXCOM G6 SENSOR) MISC CHANGE EVERY 10 DAYS TO MONITOR BLOOD GLUCOSE CONTINUOUSLY    [provider]  Continuous Blood Gluc Transmit (DEXCOM G6 TRANSMITTER) MISC CHANGE EVERY 3 MONTHS TO MONITOR BLOOD GLUCOSE CONTINUOUSLY 02/21/20   [provider]  estrogens , conjugated, (PREMARIN ) 0.625 MG  tablet Take 1 tablet (0.625 mg total) by mouth daily. Take daily 01/10/23   Amundson C Silva, Brook E, MD  insulin glargine (LANTUS SOLOSTAR) 100 UNIT/ML Solostar Pen 30u once a day Subcutaneous for 30 days 02/09/22   [provider]  Insulin Human (INSULIN PUMP) SOLN Inject 25 each into the skin as directed. Throughout the day (Per BS)    [provider]  Insulin Lispro w/ Trans Port 100 UNIT/ML SOPN INJECT AS DIRECTED 3 TIMES A DAY (USE A MAX TOTAL DAILY DOSE OF 30 UNITS) IF INSULIN PUMP FAILURE 12/16/21   [provider]  Insulin Pen Needle (BD PEN NEEDLE NANO U/F) 32G X 4 MM MISC Use to administer insulin 4 times daily if not on insulin pump 08/07/21   [provider]  levothyroxine (SYNTHROID) 137 MCG tablet take one tablet but mouth daily, but skip Sunday    [provider]  Multiple Vitamins-Minerals (QC WOMENS DAILY MULTIVITAMIN) TABS Take one tablet once daily Oral 07/03/12   [provider]  venlafaxine  XR (EFFEXOR  XR) 37.5 MG 24 hr capsule Take three capsule daily 05/02/23   Arfeen, Henrine Logan T, MD  Vitamin D , Ergocalciferol , (DRISDOL) 1.25 MG (50000 UNIT) CAPS capsule Take 50,000 Units by mouth once a week. 12/18/19   [provider]  XIIDRA 5 % SOLN Apply to eye 2 (two) times daily. 03/17/16   [provider]  Allergies    Sulfa antibiotics, Bee venom, Penicillin v potassium, and Lamotrigine     Review of Systems   Review of Systems  Physical Exam Updated Vital Signs BP (!) 142/68   Pulse 92   Temp 97.8 F (36.6 C)   Resp 20   LMP 06/28/2000   SpO2 100%  Physical Exam Vitals and nursing note reviewed.  HENT:     Head: Normocephalic and atraumatic.     Nose: Nose normal.     Mouth/Throat:     Mouth: Mucous membranes are moist.  Eyes:     Conjunctiva/sclera: Conjunctivae normal.  Cardiovascular:     Rate and Rhythm: Normal rate.  Pulmonary:     Effort: Pulmonary effort is normal.  Musculoskeletal:      Comments: Right upper extremity with no erythema, induration or swelling.  There is a small flesh-colored papule the patient thinks may be where the foreign body is.  neurovascularly intact  Skin:    General: Skin is warm and dry.     Capillary Refill: Capillary refill takes less than 2 seconds.  Neurological:     Mental Status: She is alert and oriented to person, place, and time.  Psychiatric:        Mood and Affect: Mood normal.        Behavior: Behavior normal.     ED Results / Procedures / Treatments   Labs (all labs ordered are listed, but only abnormal results are displayed) Labs Reviewed - No data to display  EKG None  Radiology No results found.  Procedures .Ultrasound ED Soft Tissue  Date/Time: 10/23/2023 7:58 PM  Performed by: Rolinda Climes, DO Authorized by: Rolinda Climes, DO   Procedure details:    Indications: evaluate for foreign body     Transverse view:  Not visualized   Longitudinal view:  Not visualized   Images: not archived   Location:    Location: upper extremity     Side:  Right Findings:     no foreign body present     Medications Ordered in ED Medications - No data to display  ED Course/ Medical Decision Making/ A&P                                 Medical Decision Making This is a 58 year old female presenting emergency department with concern for possible foreign body in her right upper extremity.  She is asymptomatic.  She is concerned as the needle was not on her continuous glucose meter and has been having connectivity issues.  Unable to identify any sign of infection or localization as to where foreign body would be based on physical exam.  Patient is also unsure where she precisely placed the glucometer.  Bedside ultrasound performed extensively over the right upper extremity.  No foreign body identified.  Discussed watchful waiting with patient and outpatient follow-up with her primary doctor.  Agreeable to plan.  Stable for  discharge at this time.         Final Clinical Impression(s) / ED Diagnoses Final diagnoses:  None    Rx / DC Orders ED Discharge Orders     None         Rolinda Climes, DO 10/23/23 2000

## 2023-10-23 NOTE — Discharge Instructions (Signed)
 Please follow-up with your primary doctor.  Return if develop fevers, chills, redness, swelling, pain to your arm, you develop abscess or any new or worsening symptoms that are concerning to you.

## 2023-10-24 ENCOUNTER — Ambulatory Visit (INDEPENDENT_AMBULATORY_CARE_PROVIDER_SITE_OTHER): Admitting: Psychiatry

## 2023-10-24 DIAGNOSIS — F331 Major depressive disorder, recurrent, moderate: Secondary | ICD-10-CM
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
## 2023-10-28 ENCOUNTER — Encounter (HOSPITAL_COMMUNITY): Payer: Self-pay | Admitting: Psychiatry

## 2023-10-28 ENCOUNTER — Telehealth (HOSPITAL_BASED_OUTPATIENT_CLINIC_OR_DEPARTMENT_OTHER): Admitting: Psychiatry

## 2023-10-28 DIAGNOSIS — F331 Major depressive disorder, recurrent, moderate: Secondary | ICD-10-CM | POA: Diagnosis not present

## 2023-10-28 DIAGNOSIS — F419 Anxiety disorder, unspecified: Secondary | ICD-10-CM | POA: Diagnosis not present

## 2023-10-28 MED ORDER — VENLAFAXINE HCL ER 37.5 MG PO CP24: ORAL_CAPSULE | ORAL | Status: DC

## 2023-10-28 MED ORDER — BUPROPION HCL ER (XL) 300 MG PO TB24: 300.0000 mg | ORAL_TABLET | ORAL | 0 refills | Status: DC

## 2023-10-28 NOTE — Progress Notes (Signed)
 [facility] MD Virtual Progress Note   Patient Location: Home Provider Location: Home Office  I connect with patient by video and verified that I am speaking with correct person by using two identifiers. I discussed the limitations of evaluation and management by telemedicine and the availability of in person appointments. I also discussed with the patient that there may be a patient responsible charge related to this service. The patient expressed understanding and agreed to proceed.  [name] [name] [id number] 58 y.o.  10/28/2023 10:39 AM  History of Present Illness:  Patient is evaluated by video session.  She is doing better on her medication.  She denies any panic attack, crying spells or any feeling of hopelessness or worthlessness.  She is in therapy with Peggy vitamin that has been going well.  She admitted continues to have stress from work so far manageable.  She take Xanax  only as needed.  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  She is working 2 days from home and coming soon her summer hours will be 9 days Monday to Thursday and have day on Friday.  She is looking forward to have Friday off.  She reported more motivated and active on the weekends but details are very long and busy.  She continues to have sleep issue on and off.  Despite taking melatonin and using CPAP sometimes she does not sleep very well.  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  Her appointment is coming up soon.  Her last hemoglobin A1c 6.3.  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  Patient is compliant with venlafaxine , Wellbutrin  and reported no tremor or shakes or any EPS.  She denies any hallucination or any paranoia.  She denies aggression or violence.  Her appetite is okay.  Her weight is stable.  Past Psychiatric History: No h/o inpatient treatment or suicidal attempt.  No h/o psychosis, mania,  or abuse.  Took Wellbutrin  prescribed by PCP.  Dose increased to 450 few years ago.  We tried Lamictal  but caused rash.  Neurologist had prescribed Adderall to help her memory.  Her appetite is okay.  Since she cut down her Adderall she noticed some time feeling tired but she is sleeping better.    Outpatient Encounter Medications as of 10/28/2023  Medication Sig   ALPRAZolam  (XANAX ) 0.25 MG tablet Take 1 tablet (0.25 mg total) by mouth 2 (two) times daily as needed for anxiety.   amphetamine -dextroamphetamine  (ADDERALL) 10 MG tablet Take 1 tablet (10 mg total) by mouth 2 (two) times daily.   Ascorbic Acid (VITAMIN C) 1000 MG tablet Take 1,000 mg by mouth daily.   BD PEN NEEDLE NANO 2ND GEN 32G X 4 MM MISC USE TO ADMINISTER INSULIN 4 TIMES DAILY IF NOT ON INSULIN PUMP   Biotin 10 MG CAPS Take 1 capsule by mouth daily.   buPROPion  (WELLBUTRIN  XL) 300 MG 24 hr tablet Take 1 tablet (300 mg total) by mouth every morning.   Continuous Blood Gluc Sensor (DEXCOM G6 SENSOR) MISC CHANGE EVERY 10 DAYS TO MONITOR BLOOD GLUCOSE CONTINUOUSLY   Continuous Blood Gluc Transmit (DEXCOM G6 TRANSMITTER) MISC CHANGE EVERY 3 MONTHS TO MONITOR BLOOD GLUCOSE CONTINUOUSLY   estrogens , conjugated, (PREMARIN ) 0.625 MG tablet Take 1 tablet (0.625 mg total) by mouth daily. Take daily   insulin glargine (LANTUS SOLOSTAR) 100 UNIT/ML Solostar Pen 30u once a day Subcutaneous for 30 days   Insulin  Human (INSULIN PUMP) SOLN Inject 25 each into the skin as directed. Throughout the day (Per BS)   Insulin Lispro w/ Trans Port 100 UNIT/ML SOPN INJECT AS DIRECTED 3 TIMES A DAY (USE A MAX TOTAL DAILY DOSE OF 30 UNITS) IF INSULIN PUMP FAILURE   Insulin Pen Needle (BD PEN NEEDLE NANO U/F) 32G X 4 MM MISC Use to administer insulin 4 times daily if not on insulin pump   levothyroxine (SYNTHROID) 137 MCG tablet take one tablet but mouth daily, but skip Sunday   Multiple Vitamins-Minerals (QC WOMENS DAILY MULTIVITAMIN) TABS Take one tablet  once daily Oral   venlafaxine  XR (EFFEXOR  XR) 37.5 MG 24 hr capsule Take three capsule daily   Vitamin D , Ergocalciferol , (DRISDOL) 1.25 MG (50000 UNIT) CAPS capsule Take 50,000 Units by mouth once a week.   XIIDRA 5 % SOLN Apply to eye 2 (two) times daily.   No facility-administered encounter medications on file as of 10/28/2023.    No results found for this or any previous visit (from the past 2160 hours).   Psychiatric Specialty Exam: Physical Exam  Review of Systems  Weight 143 lb (64.9 kg), last menstrual period 06/28/2000.There is no height or weight on file to calculate BMI.  General Appearance: Casual  Eye Contact:  Good  Speech:  Clear and Coherent  Volume:  Normal  Mood:  Euthymic  Affect:  Appropriate  Thought Process:  Goal Directed  Orientation:  Full (Time, Place, and Person)  Thought Content:  Logical  Suicidal Thoughts:  No  Homicidal Thoughts:  No  Memory:  Immediate;   Good Recent;   Good Remote;   Good  Judgement:  Good  Insight:  Good  Psychomotor Activity:  Normal  Concentration:  Concentration: Good and Attention Span: Good  Recall:  Good  Fund of Knowledge:  Good  Language:  Good  Akathisia:  No  Handed:  Right  AIMS (if indicated):     Assets:  Communication Skills Desire for Improvement Housing Talents/Skills Transportation  ADL's:  Intact  Cognition:  WNL  Sleep:  fair, using CPAP and melatonin        10/28/2023   10:51 AM 05/25/2023    3:26 PM 07/22/2022    3:23 PM 07/20/2021    3:14 PM  Depression screen PHQ 2/9  Decreased Interest 1 3 3 1   Down, Depressed, Hopeless 1 3 2 1   PHQ - 2 Score 2 6 5 2   Altered sleeping 0 3 2 1   Tired, decreased energy 1 3 3 3   Change in appetite 0 3 2 0  Feeling bad or failure about yourself  0 1 1 1   Trouble concentrating 1 3 1 1   Moving slowly or fidgety/restless 0 2 0 0  Suicidal thoughts 0 0 0 0  PHQ-9 Score 4 21 14 8   Difficult doing work/chores Not difficult at all Extremely dIfficult       Assessment/Plan: Moderate episode of recurrent major depressive disorder (HCC) - Plan: buPROPion  (WELLBUTRIN  XL) 300 MG 24 hr tablet, venlafaxine  XR (EFFEXOR  XR) 37.5 MG 24 hr capsule  Anxiety - Plan: venlafaxine  XR (EFFEXOR  XR) 37.5 MG 24 hr capsule  Patient is stable on the combination of Wellbutrin  and venlafaxine .  She also in therapy with Delvin File.  Occasionally she takes Xanax .  Reviewed blood work results.  Hemoglobin A1c 6.3.  Discussed relaxing technique when she is under stress.  Recommend walking, exercise.  Sometimes she is stressed when she watches the news.  Recommend not to watch the news and try to watch either movie or listen to the music that keep her calm.  Encouraged to continue therapy with Abran Abrahams.  Patient has still plenty of refills of venlafaxine .  She will call the pharmacy when she need a new refill of venlafaxine .  We will send a new prescription of Wellbutrin  XL 300 mg daily.  She will also call for Xanax  refill if needed in the future.  Recommend to call us  back if she is any question or any concern.  Follow-up in 3 months.   Follow Up Instructions:     I discussed the assessment and treatment plan with the patient. The patient was provided an opportunity to ask questions and all were answered. The patient agreed with the plan and demonstrated an understanding of the instructions.   The patient was advised to call back or seek an in-person evaluation if the symptoms worsen or if the condition fails to improve as anticipated.    Collaboration of Care: Other provider involved in patient's care AEB notes are available in epic to review  Patient/Guardian was advised Release of Information must be obtained prior to any record release in order to collaborate their care with an outside provider. Patient/Guardian was advised if they have not already done so to contact the registration department to sign all necessary forms in order for us  to release information  regarding their care.   Consent: Patient/Guardian gives verbal consent for treatment and assignment of benefits for services provided during this visit. Patient/Guardian expressed understanding and agreed to proceed.     Total encounter time 25 minutes which includes face-to-face time, chart reviewed, care coordination, order entry and documentation during this encounter.   Note: This document was prepared by [employer] voice dictation technology and any errors that results from this process are unintentional.    Arturo Late, MD 10/28/2023

## 2023-10-31 ENCOUNTER — Telehealth (HOSPITAL_COMMUNITY): Payer: 59 | Admitting: Psychiatry

## 2023-11-07 ENCOUNTER — Ambulatory Visit (INDEPENDENT_AMBULATORY_CARE_PROVIDER_SITE_OTHER): Admitting: Psychiatry

## 2023-11-07 DIAGNOSIS — F331 Major depressive disorder, recurrent, moderate: Secondary | ICD-10-CM
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
## 2023-11-28 ENCOUNTER — Other Ambulatory Visit: Payer: Self-pay | Admitting: Obstetrics and Gynecology

## 2023-11-28 DIAGNOSIS — Z0000 Encounter for general adult medical examination without abnormal findings: Secondary | ICD-10-CM

## 2023-11-29 ENCOUNTER — Ambulatory Visit
Admission: RE | Admit: 2023-11-29 | Discharge: 2023-11-29 | Disposition: A | Discharge status: Home / Self Care | Source: Ambulatory Visit | Attending: Obstetrics and Gynecology | Admitting: Obstetrics and Gynecology

## 2023-11-29 DIAGNOSIS — Z0000 Encounter for general adult medical examination without abnormal findings: Secondary | ICD-10-CM

## 2023-12-02 ENCOUNTER — Other Ambulatory Visit (HOSPITAL_COMMUNITY): Payer: Self-pay

## 2023-12-02 DIAGNOSIS — F419 Anxiety disorder, unspecified: Secondary | ICD-10-CM

## 2023-12-02 MED ORDER — ALPRAZOLAM 0.25 MG PO TABS: 0.2500 mg | ORAL_TABLET | Freq: Two times a day (BID) | ORAL | 0 refills | Status: AC | PRN

## 2023-12-05 ENCOUNTER — Ambulatory Visit: Payer: Self-pay | Admitting: Obstetrics and Gynecology

## 2023-12-19 ENCOUNTER — Ambulatory Visit (HOSPITAL_COMMUNITY): Admitting: Psychiatry

## 2023-12-19 DIAGNOSIS — F419 Anxiety disorder, unspecified: Secondary | ICD-10-CM

## 2023-12-19 DIAGNOSIS — F331 Major depressive disorder, recurrent, moderate: Secondary | ICD-10-CM
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
## 2023-12-29 ENCOUNTER — Ambulatory Visit: Admitting: Neurology

## 2023-12-29 ENCOUNTER — Encounter: Payer: Self-pay | Admitting: Neurology

## 2023-12-29 VITALS — BP 109/72 | HR 93

## 2023-12-29 DIAGNOSIS — G4711 Idiopathic hypersomnia with long sleep time: Secondary | ICD-10-CM

## 2023-12-29 DIAGNOSIS — G4733 Obstructive sleep apnea (adult) (pediatric): Secondary | ICD-10-CM

## 2023-12-29 NOTE — Progress Notes (Signed)
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 Yes    Partners: Male    Birth control/protection: Surgical, Condom    Comment: [facility]  Other Topics Concern   Not on file  Social History Narrative   Consumes 120mg  caffeine daily,left handed   Social Drivers of Corporate investment banker Strain: Low Risk  (11/30/2023)   Received from [facility]   Overall Financial Resource Strain (CARDIA)    Difficulty of Paying Living Expenses: Not hard at all  Food Insecurity: No Food Insecurity (11/30/2023)   Received from [facility]   Hunger Vital Sign    Within the past 12 months, you worried that your food would run out before you got the money to buy more.: Never true    Within the past 12 months, the food you bought just didn't last and you didn't have money to get more.: Never true  Transportation Needs: No Transportation Needs (11/30/2023)   Received from [facility] - Transportation    Lack of Transportation (Medical): No    Lack of Transportation (Non-Medical): No  Physical Activity: Not on file  Stress: Not on file  Social Connections: Not on file    Her Allergies Are:  Allergies  Allergen Reactions   Sulfa Antibiotics Anaphylaxis   Bee Venom Other (See Comments)    swelling   Penicillin V Potassium Hives    Lamotrigine  Rash and Other (See Comments)  :   Her Current Medications Are:  Outpatient Encounter Medications as of 12/29/2023  Medication Sig   ALPRAZolam  (XANAX ) 0.25 MG tablet Take 1 tablet (0.25 mg total) by mouth 2 (two) times daily as needed for anxiety.   amphetamine -dextroamphetamine  (ADDERALL) 10 MG tablet Take 1 tablet (10 mg total) by mouth 2 (two) times daily. (Patient taking differently: Take 10 mg by mouth daily with breakfast.)   Ascorbic Acid (VITAMIN C) 1000 MG tablet Take 1,000 mg by mouth daily.   BD PEN NEEDLE NANO 2ND GEN 32G X 4 MM MISC USE TO ADMINISTER INSULIN 4 TIMES DAILY IF NOT ON INSULIN PUMP   Biotin 10 MG CAPS Take 1 capsule by mouth daily.   buPROPion  (WELLBUTRIN  XL) 300 MG 24 hr tablet Take 1 tablet (300 mg total) by mouth  every morning.   Continuous Blood Gluc Sensor (DEXCOM G6 SENSOR) MISC CHANGE EVERY 10 DAYS TO MONITOR BLOOD GLUCOSE CONTINUOUSLY   Continuous Blood Gluc Transmit (DEXCOM G6 TRANSMITTER) MISC CHANGE EVERY 3 MONTHS TO MONITOR BLOOD GLUCOSE CONTINUOUSLY   estrogens , conjugated, (PREMARIN ) 0.625 MG tablet Take 1 tablet (0.625 mg total) by mouth daily. Take daily   insulin glargine (LANTUS SOLOSTAR) 100 UNIT/ML Solostar Pen 30u once a day Subcutaneous for 30 days   Insulin Human (INSULIN PUMP) SOLN Inject 25 each into the skin as directed. Throughout the day (Per BS)   Insulin Lispro w/ Trans Port 100 UNIT/ML SOPN INJECT AS DIRECTED 3 TIMES A DAY (USE A MAX TOTAL DAILY DOSE OF 30 UNITS) IF INSULIN PUMP FAILURE   Insulin Pen Needle (BD PEN NEEDLE NANO U/F) 32G X 4 MM MISC Use to administer insulin 4 times daily if not on insulin pump   levothyroxine (SYNTHROID) 137 MCG tablet take one tablet but mouth daily, but skip Sunday   Multiple Vitamins-Minerals (QC WOMENS DAILY MULTIVITAMIN) TABS Take one tablet once daily Oral   venlafaxine  XR (EFFEXOR  XR) 37.5 MG 24 hr capsule Take three capsule daily   Vitamin D , Ergocalciferol , (DRISDOL) 1.25 MG (50000  UNIT) CAPS capsule Take 50,000 Units by mouth once a week.   XIIDRA 5 % SOLN Apply to eye 2 (two) times daily.   No facility-administered encounter medications on file as of 12/29/2023.  :  Review of Systems:  Out of a complete 14 point review of systems, all are reviewed and negative with the exception of these symptoms as listed below:  Review of Systems  Neurological:        Not sleeping well.  Taking adderall not as effective (falling asleep on [address]) when working 2 days a week in [place].  ESS 28.    Objective:  Neurological Exam  Physical Exam Physical Examination:   Vitals:   12/29/23 1324  BP: 109/72  Pulse: 93    General Examination: The patient is a very pleasant 58 y.o. female in no acute distress. She appears well-developed and well-nourished and well groomed.   HEENT: Normocephalic, atraumatic, pupils are equal, round and reactive to light, extraocular tracking is good without limitation to gaze excursion or nystagmus noted. Corrective eyeglasses in place. Normal smooth pursuit is noted. Hearing is grossly intact. Face is symmetric with normal facial animation. Speech is clear with no dysarthria noted. Neck with FROM. Oropharynx exam reveals: No obvious change.  No carotid bruits.   Chest: Clear to auscultation without wheezing, rhonchi or crackles noted.   Heart: S1+S2+0, no murmur.     Abdomen: Soft, non-tender and non-distended.   Extremities: There is no obvious edema.      Skin: Warm and dry without trophic changes noted.    Musculoskeletal: exam reveals no obvious joint deformities.     Neurologically:  Mental status: The patient is awake, alert and oriented in all 4 spheres. Her memory, attention, language and knowledge are appropriate. There is no aphasia, agnosia, apraxia or anomia. Speech is clear with normal prosody and enunciation. Thought process is linear. Mood is normal and affect is normal.    Cranial nerves are as described above under HEENT exam.   Motor exam: Normal bulk, moving all 4 extremities without restriction, no resting or action tremor.  Sensory exam is intact to light touch in the upper and lower extremities.  Fine motor skills are grossly intact. On cerebellar testing: no dysmetria, or ataxia.  Gait, station  and balance are unremarkable. No veering to one side is noted. No leaning to one side is noted. Posture is age-appropriate and stance is narrow based. No problems turning are noted.    Assessment and Plan:    In summary, [name] [name] is a very pleasant 58 year old female with an underlying history of hypothyroidism, DM type I, anxiety and depression, low back pain, and s/p neck surgery, who presents for follow up of her obstructive sleep apnea and hypersomnolence disorder. She continues to use her CPAP, and has been quite consistent with it.  Apnea control is good on the current settings.  She has an average usage of just a little over 6 hours typically.  She has utilized Adderall in low-dose for the most part, only once daily and is encouraged to increase it to twice daily particularly on those days where she has to commute.  Her prescription has already allowed her to take up to 2 a day.  She is currently working with her psychiatrist to wean off of her Xanax  which has been low-dose.  She is working with her therapist on  her stress reduction.  Unfortunately, she has had increase in stress.  Neurological exam continues to be nonfocal.  She is followed regularly by her psychiatrist and is on stable doses of her antidepressants.  She is advised to follow-up routinely in 1 year, sooner if needed.  Of note, in the past, she has seen a neuropsychologist. We did workup for cognitive concerns, which was reassuring in the past.  She had an EEG in December 2018 which was reported as normal. She had neuropsychological evaluation in the past with Dr. Zelson on 08/08/2014 with benign results at the time. She also had workup in 2018 for TIA  concern. She saw Dr. Shelle, neuropsychologist in the past as well. She had sleep study testing in the form of CPAP titration full night followed by a MSLT the next day in the past. Her CPAP pressure was adequate pressure of 9 cm during the sleep study, but she had an abnormal next day nap test, in keeping with with idiopathic hypersomnolence. She had a head CT without contrast and MRI brain without contrast on 04/18/2017 with benign results. She also had a normal brain MRI in July 2015. Her physical and neurological exam have been nonfocal.   I answered all her questions today and she was in agreement. I spent 30 minutes in total face-to-face time and in reviewing records during pre-charting, more than 50% of which was spent in counseling and coordination of care, reviewing test results, reviewing medications and treatment regimen and/or in discussing or reviewing the diagnosis of OSA, IH, the prognosis and treatment options. Pertinent laboratory and imaging test results that were available during this visit with the patient were reviewed by me and considered in my medical decision making (see chart for details).

## 2024-01-02 ENCOUNTER — Other Ambulatory Visit: Payer: Self-pay | Admitting: Neurology

## 2024-01-02 ENCOUNTER — Ambulatory Visit (HOSPITAL_COMMUNITY): Admitting: Psychiatry

## 2024-01-02 DIAGNOSIS — F331 Major depressive disorder, recurrent, moderate: Secondary | ICD-10-CM

## 2024-01-02 DIAGNOSIS — G4711 Idiopathic hypersomnia with long sleep time: Secondary | ICD-10-CM

## 2024-01-02 DIAGNOSIS — G4733 Obstructive sleep apnea (adult) (pediatric): Secondary | ICD-10-CM

## 2024-01-02 MED ORDER — AMPHETAMINE-DEXTROAMPHETAMINE 10 MG PO TABS
10.0000 mg | ORAL_TABLET | Freq: Two times a day (BID) | ORAL | 0 refills | Status: DC
Start: 2024-01-02 — End: 2024-03-08

## 2024-01-02 NOTE — Telephone Encounter (Signed)
 Last fill 10-03-2023 #60.  Last seen 12-29-2023.

## 2024-01-02 NOTE — Telephone Encounter (Signed)
 Patient request refill for amphetamine -dextroamphetamine  (ADDERALL) 10 MG tablet send to  [facility] [phone]
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
## 2024-01-23 ENCOUNTER — Ambulatory Visit (HOSPITAL_COMMUNITY): Admitting: Psychiatry

## 2024-01-23 DIAGNOSIS — F419 Anxiety disorder, unspecified: Secondary | ICD-10-CM

## 2024-01-23 DIAGNOSIS — F331 Major depressive disorder, recurrent, moderate: Secondary | ICD-10-CM | POA: Diagnosis not present
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
## 2024-01-30 ENCOUNTER — Encounter (HOSPITAL_COMMUNITY): Payer: Self-pay

## 2024-01-30 ENCOUNTER — Telehealth (HOSPITAL_COMMUNITY): Payer: Self-pay | Admitting: Psychiatry

## 2024-01-30 ENCOUNTER — Telehealth (HOSPITAL_BASED_OUTPATIENT_CLINIC_OR_DEPARTMENT_OTHER): Payer: Self-pay | Admitting: Psychiatry

## 2024-01-30 DIAGNOSIS — Z91199 Patient's noncompliance with other medical treatment and regimen due to unspecified reason: Secondary | ICD-10-CM

## 2024-01-30 NOTE — Progress Notes (Signed)
 Patient is no show today.

## 2024-02-06 ENCOUNTER — Ambulatory Visit (HOSPITAL_COMMUNITY): Admitting: Psychiatry

## 2024-02-06 ENCOUNTER — Ambulatory Visit (INDEPENDENT_AMBULATORY_CARE_PROVIDER_SITE_OTHER): Admitting: Psychiatry

## 2024-02-06 DIAGNOSIS — F331 Major depressive disorder, recurrent, moderate: Secondary | ICD-10-CM
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
## 2024-03-05 ENCOUNTER — Ambulatory Visit (HOSPITAL_COMMUNITY): Admitting: Psychiatry

## 2024-03-05 DIAGNOSIS — F419 Anxiety disorder, unspecified: Secondary | ICD-10-CM | POA: Diagnosis not present

## 2024-03-05 DIAGNOSIS — F329 Major depressive disorder, single episode, unspecified: Secondary | ICD-10-CM | POA: Diagnosis not present

## 2024-03-05 DIAGNOSIS — F331 Major depressive disorder, recurrent, moderate: Secondary | ICD-10-CM
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
## 2024-03-08 ENCOUNTER — Other Ambulatory Visit: Payer: Self-pay | Admitting: Neurology

## 2024-03-08 DIAGNOSIS — G4711 Idiopathic hypersomnia with long sleep time: Secondary | ICD-10-CM

## 2024-03-08 DIAGNOSIS — G4733 Obstructive sleep apnea (adult) (pediatric): Secondary | ICD-10-CM

## 2024-03-08 NOTE — Telephone Encounter (Signed)
 Requested Prescriptions   Pending Prescriptions Disp Refills   amphetamine -dextroamphetamine  (ADDERALL) 10 MG tablet 60 tablet 0    Sig: Take 1 tablet (10 mg total) by mouth 2 (two) times daily.   Last seen 12/29/23 Next appt 01/03/25  Dispenses   Dispensed Days Supply Quantity Provider Pharmacy  DEXTROAMP-AMPHETAMIN 10 MG TAB 01/02/2024 30 60 each Athar, Saima, MD [facility] [phone] - [facility]...  DEXTROAMP-AMPHETAMIN 10 MG TAB 10/03/2023 30 60 each Athar, Saima, MD [facility] [phone] - [facility]...  DEXTROAMP-AMPHETAMIN 10 MG TAB 07/28/2023 30 60 each Athar, Saima, MD [facility] [phone] - [facility]...  DEXTROAMP-AMPHETAMIN 10 MG TAB 05/09/2023 30 60 each Athar, Saima, MD [facility] [phone] - [facility]

## 2024-03-08 NOTE — Telephone Encounter (Signed)
 Pt is requesting a refill for amphetamine-dextroamphetamine (ADDERALL) 10 MG tablet .  Pharmacy: [facility] [phone]

## 2024-03-13 MED ORDER — AMPHETAMINE-DEXTROAMPHETAMINE 10 MG PO TABS: 10.0000 mg | ORAL_TABLET | Freq: Two times a day (BID) | ORAL | 0 refills | Status: DC

## 2024-03-13 NOTE — Telephone Encounter (Signed)
 Error

## 2024-03-19 ENCOUNTER — Ambulatory Visit (HOSPITAL_COMMUNITY): Admitting: Psychiatry

## 2024-03-19 DIAGNOSIS — F331 Major depressive disorder, recurrent, moderate: Secondary | ICD-10-CM | POA: Diagnosis not present
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
## 2024-03-26 ENCOUNTER — Other Ambulatory Visit (HOSPITAL_COMMUNITY): Payer: Self-pay | Admitting: *Deleted

## 2024-03-26 DIAGNOSIS — F331 Major depressive disorder, recurrent, moderate: Secondary | ICD-10-CM

## 2024-03-26 DIAGNOSIS — F419 Anxiety disorder, unspecified: Secondary | ICD-10-CM

## 2024-03-26 MED ORDER — VENLAFAXINE HCL ER 37.5 MG PO CP24: ORAL_CAPSULE | ORAL | 0 refills | Status: DC

## 2024-03-26 MED ORDER — BUPROPION HCL ER (XL) 300 MG PO TB24: 300.0000 mg | ORAL_TABLET | ORAL | 0 refills | Status: DC

## 2024-03-29 ENCOUNTER — Encounter (HOSPITAL_COMMUNITY): Payer: Self-pay | Admitting: Psychiatry

## 2024-03-29 ENCOUNTER — Telehealth (HOSPITAL_BASED_OUTPATIENT_CLINIC_OR_DEPARTMENT_OTHER): Admitting: Psychiatry

## 2024-03-29 VITALS — Wt 146.0 lb

## 2024-03-29 DIAGNOSIS — F419 Anxiety disorder, unspecified: Secondary | ICD-10-CM | POA: Diagnosis not present

## 2024-03-29 DIAGNOSIS — F331 Major depressive disorder, recurrent, moderate: Secondary | ICD-10-CM | POA: Diagnosis not present

## 2024-03-29 MED ORDER — VENLAFAXINE HCL ER 37.5 MG PO CP24: ORAL_CAPSULE | ORAL | 2 refills | Status: DC

## 2024-03-29 MED ORDER — BUPROPION HCL ER (XL) 300 MG PO TB24: 300.0000 mg | ORAL_TABLET | ORAL | 2 refills | Status: DC

## 2024-03-29 NOTE — Progress Notes (Signed)
 [name] MD Virtual Progress Note   Patient Location: Home Provider Location: Office  I connect with patient by video and verified that I am speaking with correct person by using two identifiers. I discussed the limitations of evaluation and management by telemedicine and the availability of in person appointments. I also discussed with the patient that there may be a patient responsible charge related to this service. The patient expressed understanding and agreed to proceed.  [name] [name] [id number] 58 y.o.  03/29/2024 3:14 PM  History of Present Illness:  Patient is evaluated by video session.  She reported things are going very well.  She has few minor panic attack going outside but these attacks are manageable.  She took 1 Xanax  last week which she could not sleep and having racing thoughts.  Patient reported panic attacks are usually triggered with low hypoglycemia and she usually carry candies with her.  Her last hemoglobin A1c 6.4 which is stable.  She reported job is busy but manageable.  She is now working 2 days in person but starting January she will work 3 days in person.  Patient told since summer is over she is back on full day Friday.  She reported mother has ups and down and fluctuates according to her memory.  She is hoping the new medicine for dementia may help her.  She is using CPAP and also takes melatonin for to help her sleep.  She also on Adderall for her narcolepsy.  Patient told yesterday she has to take energy drink because she was very tired while she was driving.  Patient has no tremor or shakes or any EPS.  She denies any suicidal thoughts.  She is taking venlafaxine  112.5 mg and Wellbutrin  XL 300 mg daily.  She apologized missing her last appointment because her phone was not working.  She denies any mania or psychosis.  She like to keep the current medication.  Her appetite is okay and weight is stable.  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  Past Psychiatric History: No h/o inpatient treatment or suicidal attempt.  No h/o psychosis, mania, or abuse.  Took Wellbutrin  prescribed by PCP.  Dose increased to 450 few years ago.  We tried Lamictal  but caused rash.  Neurologist had prescribed Adderall to help her memory.  Her appetite is okay.  Since she cut down her Adderall she noticed some time feeling tired but she is sleeping better.   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   Diabetes mellitus    Dry eyes, bilateral 2018   Fall at home 05/2018   injured low back/spine/sciatic nerve--had 2 spinal injections   Ketoacidosis 8014,8005   Melasma 01/2020   Meningitis 06/2001   --hospital   Rheumatoid arthritis (HCC) 2014   Sleep apnea 2013   Sleep apnea 2011   STD (sexually transmitted disease) 6/09   HSV II   Stress    Thyroid disease    hypothyroidism   VAIN I (vaginal intraepithelial neoplasia grade I) 2013    Outpatient Encounter Medications as of 03/29/2024  Medication Sig   ALPRAZolam  (XANAX ) 0.25 MG tablet Take 1 tablet (0.25 mg total) by mouth 2 (two) times daily as needed for anxiety.   amphetamine -dextroamphetamine  (ADDERALL) 10 MG tablet Take 1 tablet (10 mg total) by mouth 2 (two) times daily.   Ascorbic Acid (VITAMIN C) 1000 MG tablet Take  1,000 mg by mouth daily.   BD PEN NEEDLE NANO 2ND GEN 32G X 4 MM MISC USE TO ADMINISTER INSULIN 4 TIMES DAILY IF NOT ON INSULIN PUMP   Biotin 10 MG CAPS Take 1 capsule by mouth daily.   buPROPion  (WELLBUTRIN  XL) 300 MG 24 hr tablet Take 1 tablet (300 mg total) by mouth every morning.   Continuous Blood Gluc Sensor (DEXCOM G6 SENSOR) MISC CHANGE EVERY 10 DAYS TO MONITOR BLOOD GLUCOSE CONTINUOUSLY   Continuous Blood Gluc Transmit (DEXCOM G6 TRANSMITTER) MISC CHANGE EVERY 3 MONTHS TO MONITOR BLOOD GLUCOSE CONTINUOUSLY   estrogens , conjugated, (PREMARIN ) 0.625 MG tablet Take 1  tablet (0.625 mg total) by mouth daily. Take daily   insulin glargine (LANTUS SOLOSTAR) 100 UNIT/ML Solostar Pen 30u once a day Subcutaneous for 30 days   Insulin Human (INSULIN PUMP) SOLN Inject 25 each into the skin as directed. Throughout the day (Per BS)   Insulin Lispro w/ Trans Port 100 UNIT/ML SOPN INJECT AS DIRECTED 3 TIMES A DAY (USE A MAX TOTAL DAILY DOSE OF 30 UNITS) IF INSULIN PUMP FAILURE   Insulin Pen Needle (BD PEN NEEDLE NANO U/F) 32G X 4 MM MISC Use to administer insulin 4 times daily if not on insulin pump   levothyroxine (SYNTHROID) 137 MCG tablet take one tablet but mouth daily, but skip Sunday   Multiple Vitamins-Minerals (QC WOMENS DAILY MULTIVITAMIN) TABS Take one tablet once daily Oral   venlafaxine  XR (EFFEXOR  XR) 37.5 MG 24 hr capsule Take three capsule daily   Vitamin D , Ergocalciferol , (DRISDOL) 1.25 MG (50000 UNIT) CAPS capsule Take 50,000 Units by mouth once a week.   XIIDRA 5 % SOLN Apply to eye 2 (two) times daily.   No facility-administered encounter medications on file as of 03/29/2024.    No results found for this or any previous visit (from the past 2160 hours).   Psychiatric Specialty Exam: Physical Exam  Review of Systems  Weight 146 lb (66.2 kg), last menstrual period 06/28/2000.There is no height or weight on file to calculate BMI.  General Appearance: Casual  Eye Contact:  Good  Speech:  Clear and Coherent and Normal Rate  Volume:  Normal  Mood:  Euthymic  Affect:  Appropriate  Thought Process:  Goal Directed  Orientation:  Full (Time, Place, and Person)  Thought Content:  Logical  Suicidal Thoughts:  No  Homicidal Thoughts:  No  Memory:  Immediate;   Good Recent;   Good Remote;   Good  Judgement:  Good  Insight:  Good  Psychomotor Activity:  Normal  Concentration:  Concentration: Good and Attention Span: Good  Recall:  Good  Fund of Knowledge:  Good  Language:  Good  Akathisia:  No  Handed:  Right  AIMS (if indicated):     Assets:   Communication Skills Desire for Improvement Housing Resilience Talents/Skills Transportation  ADL's:  Intact  Cognition:  WNL  Sleep:  using CPAP also takes melatonin       02/06/2024    2:10 PM 10/28/2023   10:51 AM 05/25/2023    3:26 PM 07/22/2022    3:23 PM 07/20/2021    3:14 PM  Depression screen PHQ 2/9  Decreased Interest 1 1 3 3 1   Down, Depressed, Hopeless 2 1 3 2 1   PHQ - 2 Score 3 2 6 5 2   Altered sleeping 3 0 3 2 1   Tired, decreased energy 3 1 3 3 3   Change in appetite 0 0 3 2 0  Feeling bad or failure about yourself  1 0 1 1 1   Trouble concentrating 1 1 3 1 1   Moving slowly or fidgety/restless 0 0 2 0 0  Suicidal thoughts 0 0 0 0 0  PHQ-9 Score 11 4 21 14 8   Difficult doing work/chores  Not difficult at all Extremely dIfficult      Assessment/Plan: Moderate episode of recurrent major depressive disorder (HCC) - Plan: buPROPion  (WELLBUTRIN  XL) 300 MG 24 hr tablet, venlafaxine  XR (EFFEXOR  XR) 37.5 MG 24 hr capsule  Anxiety - Plan: venlafaxine  XR (EFFEXOR  XR) 37.5 MG 24 hr capsule  Patient is stable on her current medication.  Rarely she takes Xanax  and her last dose was more than a week ago when she was feeling nervous and anxious.  Discussed occasional panic attacks but they are manageable.  So far no major concern from the medication.  Last hemoglobin A1c 6.4.  Encouraged to continue therapy with Winton Rubinstein.  Continue Effexor  112.5 mg daily and Wellbutrin  XL 300 mg daily.  Recommend to call back if she is any question or any concern.  She will call us  if she need of prescription of Xanax .  Follow-up in 3 months.   Follow Up Instructions:     I discussed the assessment and treatment plan with the patient. The patient was provided an opportunity to ask questions and all were answered. The patient agreed with the plan and demonstrated an understanding of the instructions.   The patient was advised to call back or seek an in-person evaluation if the symptoms worsen or  if the condition fails to improve as anticipated.    Collaboration of Care: Other provider involved in patient's care AEB notes are available in epic to review  Patient/Guardian was advised Release of Information must be obtained prior to any record release in order to collaborate their care with an outside provider. Patient/Guardian was advised if they have not already done so to contact the registration department to sign all necessary forms in order for us  to release information regarding their care.   Consent: Patient/Guardian gives verbal consent for treatment and assignment of benefits for services provided during this visit. Patient/Guardian expressed understanding and agreed to proceed.     Total encounter time 21 minutes which includes face-to-face time, chart reviewed, care coordination, order entry and documentation during this encounter.   Note: This document was prepared by [employer] voice dictation technology and any errors that results from this process are unintentional.    Leni ONEIDA Client, MD 03/29/2024

## 2024-04-02 ENCOUNTER — Ambulatory Visit (HOSPITAL_COMMUNITY): Admitting: Psychiatry

## 2024-04-03 ENCOUNTER — Other Ambulatory Visit: Payer: Self-pay | Admitting: Obstetrics and Gynecology

## 2024-04-03 DIAGNOSIS — R232 Flushing: Secondary | ICD-10-CM

## 2024-04-04 NOTE — Telephone Encounter (Signed)
 Med refill request: estradiol  (VIVELLE  DOT) patch Last AEX: 10/11/22 BS Next AEX: not scheduled, sent message to front desk to scheduled annual visit. Last MMG (if hormonal med) 11/29/23 Refill authorized: I spoke to patient and she states that she has switched back to the patch. Last Rx sent #24 with 3 refills on 11/02/22 BS. Please Advise?

## 2024-04-30 ENCOUNTER — Ambulatory Visit (INDEPENDENT_AMBULATORY_CARE_PROVIDER_SITE_OTHER): Admitting: Psychiatry

## 2024-04-30 ENCOUNTER — Telehealth (HOSPITAL_COMMUNITY): Payer: Self-pay | Admitting: Psychiatry

## 2024-04-30 DIAGNOSIS — F331 Major depressive disorder, recurrent, moderate: Secondary | ICD-10-CM | POA: Diagnosis not present

## 2024-04-30 NOTE — Telephone Encounter (Signed)
 Therapist attempted to contact patient via text through caregility platform for scheduled appointment, no response.  Therapist called patient, left message indicating attempt, and requesting patient call office.
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
## 2024-05-14 ENCOUNTER — Ambulatory Visit (HOSPITAL_COMMUNITY): Admitting: Psychiatry

## 2024-05-16 ENCOUNTER — Other Ambulatory Visit: Payer: Self-pay | Admitting: Neurology

## 2024-05-16 DIAGNOSIS — G4733 Obstructive sleep apnea (adult) (pediatric): Secondary | ICD-10-CM

## 2024-05-16 DIAGNOSIS — G4711 Idiopathic hypersomnia with long sleep time: Secondary | ICD-10-CM

## 2024-05-16 MED ORDER — AMPHETAMINE-DEXTROAMPHETAMINE 10 MG PO TABS: 10.0000 mg | ORAL_TABLET | Freq: Two times a day (BID) | ORAL | 0 refills | Status: DC

## 2024-05-16 NOTE — Telephone Encounter (Signed)
 Pt called to request medication refill amphetamine -dextroamphetamine  (ADDERALL) 10 MG tablet   Pt medication is to be sent to   [id number] - [place], [place] - [address] AT [facility] [address] (Ph: [phone])

## 2024-05-16 NOTE — Telephone Encounter (Signed)
 Last saw Dr. Buck 12/29/23. Next appt: 01/03/25. Last refilled 03/13/24 #60.

## 2024-05-23 LAB — HM COLONOSCOPY

## 2024-05-28 ENCOUNTER — Ambulatory Visit (HOSPITAL_COMMUNITY): Admitting: Psychiatry

## 2024-06-14 ENCOUNTER — Ambulatory Visit (HOSPITAL_COMMUNITY): Admitting: Psychiatry

## 2024-06-24 ENCOUNTER — Other Ambulatory Visit (HOSPITAL_COMMUNITY): Payer: Self-pay | Admitting: Psychiatry

## 2024-06-24 DIAGNOSIS — F331 Major depressive disorder, recurrent, moderate: Secondary | ICD-10-CM

## 2024-06-24 DIAGNOSIS — F419 Anxiety disorder, unspecified: Secondary | ICD-10-CM

## 2024-06-25 ENCOUNTER — Other Ambulatory Visit (HOSPITAL_COMMUNITY): Payer: Self-pay

## 2024-06-25 ENCOUNTER — Other Ambulatory Visit: Payer: Self-pay | Admitting: Neurology

## 2024-06-25 DIAGNOSIS — F331 Major depressive disorder, recurrent, moderate: Secondary | ICD-10-CM

## 2024-06-25 DIAGNOSIS — G4733 Obstructive sleep apnea (adult) (pediatric): Secondary | ICD-10-CM

## 2024-06-25 DIAGNOSIS — G4711 Idiopathic hypersomnia with long sleep time: Secondary | ICD-10-CM

## 2024-06-25 MED ORDER — AMPHETAMINE-DEXTROAMPHETAMINE 10 MG PO TABS: 10.0000 mg | ORAL_TABLET | Freq: Two times a day (BID) | ORAL | 0 refills | Status: AC

## 2024-06-25 MED ORDER — BUPROPION HCL ER (XL) 300 MG PO TB24: 300.0000 mg | ORAL_TABLET | ORAL | 0 refills | Status: DC

## 2024-06-25 NOTE — Telephone Encounter (Signed)
 Pt is requesting a refill for amphetamine -dextroamphetamine  (ADDERALL) 10 MG table.  Pharmacy: [phone]

## 2024-06-25 NOTE — Telephone Encounter (Signed)
 Last OV 12-29-2023 Last fill 05-16-2024 #60.  Next OV 01-03-2025

## 2024-07-02 ENCOUNTER — Telehealth (HOSPITAL_BASED_OUTPATIENT_CLINIC_OR_DEPARTMENT_OTHER): Admitting: Psychiatry

## 2024-07-02 ENCOUNTER — Other Ambulatory Visit: Payer: Self-pay | Admitting: Obstetrics and Gynecology

## 2024-07-02 ENCOUNTER — Encounter (HOSPITAL_COMMUNITY): Payer: Self-pay | Admitting: Psychiatry

## 2024-07-02 VITALS — Wt 140.0 lb

## 2024-07-02 DIAGNOSIS — R232 Flushing: Secondary | ICD-10-CM

## 2024-07-02 DIAGNOSIS — F331 Major depressive disorder, recurrent, moderate: Secondary | ICD-10-CM | POA: Diagnosis not present

## 2024-07-02 DIAGNOSIS — F419 Anxiety disorder, unspecified: Secondary | ICD-10-CM | POA: Diagnosis not present

## 2024-07-02 MED ORDER — VENLAFAXINE HCL ER 37.5 MG PO CP24: ORAL_CAPSULE | ORAL | 2 refills | Status: AC

## 2024-07-02 MED ORDER — BUPROPION HCL ER (XL) 300 MG PO TB24: 300.0000 mg | ORAL_TABLET | ORAL | 2 refills | Status: AC

## 2024-07-02 NOTE — Telephone Encounter (Signed)
 Med refill request: ESTRADIOL  (VIVELLE -DOT) 0.05 MG PATCH Last AEX: 10/11/22 BS Next AEX: not scheduled, sent message to front desk to schedule pt for annual visit Last MMG (if hormonal med) 11/29/23 Refill authorized: Please Advise? Last Rx sent #24 with zero refill son 04/04/24 - BS

## 2024-07-02 NOTE — Progress Notes (Signed)
 " [facility] MD Virtual Progress Note   Patient Location: Home Provider Location: Home Office  I connect with patient by video and verified that I am speaking with correct person by using two identifiers. I discussed the limitations of evaluation and management by telemedicine and the availability of in person appointments. I also discussed with the patient that there may be a patient responsible charge related to this service. The patient expressed understanding and agreed to proceed.  [name] [name] [id number] 59 y.o.  07/02/2024 3:26 PM  History of Present Illness:  Patient is evaluated by video session.  She reported things are going very well and currently job is virtual because of renovation.  Patient told for next 3 months she will work from home and after that she may have to go 3 days a week to [place].  She still looking for another job because of commute but otherwise denies any concern.  She had a new prescription of Xanax  before Christmas because she thought she may need it around holidays but she never used.  She also completed therapy with Winton Rubinstein.  She reported have enough resources that if needed that she can utilize those resources.  She denies any panic attack.  She uses CPAP and sometimes melatonin.  Her sleep is good.  She denies any crying spells or any feeling of hopelessness or worthlessness.  She is taking venlafaxine  and Wellbutrin  and she feels the combination is going very well.  Her last hemoglobin A1c which was done in October was 6.5.  She has no tremor or shakes or any EPS.  Past Psychiatric History: No h/o inpatient treatment or suicidal attempt.  No h/o psychosis, mania, or abuse.  Took Wellbutrin  prescribed by PCP.  Dose increased to 450 few years ago.  We tried Lamictal  but caused rash.  Neurologist had prescribed Adderall to help her memory.  Her appetite is okay.  Since she cut down her Adderall she noticed some time feeling tired but she is  sleeping better.   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   Diabetes mellitus    Dry eyes, bilateral 2018   Fall at home 05/2018   injured low back/spine/sciatic nerve--had 2 spinal injections   Ketoacidosis 8014,8005   Melasma 01/2020   Meningitis 06/2001   --hospital   Rheumatoid arthritis (HCC) 2014   Sleep apnea 2013   Sleep apnea 2011   STD (sexually transmitted disease) 6/09   HSV II   Stress    Thyroid disease    hypothyroidism   VAIN I (vaginal intraepithelial neoplasia grade I) 2013    Outpatient Encounter Medications as of 07/02/2024  Medication Sig   ALPRAZolam  (XANAX ) 0.25 MG tablet Take 1 tablet (0.25 mg total) by mouth 2 (two) times daily as needed for anxiety.   amphetamine -dextroamphetamine  (ADDERALL) 10 MG tablet Take 1 tablet (10 mg total) by mouth 2 (two) times daily.   Ascorbic Acid (VITAMIN C) 1000 MG tablet Take 1,000 mg by mouth daily.   BD PEN NEEDLE NANO 2ND GEN 32G X 4 MM MISC USE TO ADMINISTER INSULIN 4 TIMES DAILY IF NOT ON INSULIN PUMP   Biotin 10 MG CAPS Take 1 capsule by mouth daily.   buPROPion  (WELLBUTRIN  XL) 300 MG 24 hr tablet Take 1 tablet (300 mg total) by mouth every morning.   Continuous Blood Gluc Sensor (DEXCOM G6 SENSOR) MISC CHANGE EVERY 10  DAYS TO MONITOR BLOOD GLUCOSE CONTINUOUSLY   Continuous Blood Gluc Transmit (DEXCOM G6 TRANSMITTER) MISC CHANGE EVERY 3 MONTHS TO MONITOR BLOOD GLUCOSE CONTINUOUSLY   estradiol  (VIVELLE -DOT) 0.05 MG/24HR patch APPLY 1 PATCH ONTO THE SKIN TWO TIMES A WEEK   insulin glargine (LANTUS SOLOSTAR) 100 UNIT/ML Solostar Pen 30u once a day Subcutaneous for 30 days   Insulin Human (INSULIN PUMP) SOLN Inject 25 each into the skin as directed. Throughout the day (Per BS)   Insulin Lispro w/ Trans Port 100 UNIT/ML SOPN INJECT AS DIRECTED 3 TIMES A DAY (USE A MAX TOTAL DAILY DOSE OF 30 UNITS) IF INSULIN PUMP FAILURE   Insulin Pen Needle  (BD PEN NEEDLE NANO U/F) 32G X 4 MM MISC Use to administer insulin 4 times daily if not on insulin pump   levothyroxine (SYNTHROID) 137 MCG tablet take one tablet but mouth daily, but skip Sunday   Multiple Vitamins-Minerals (QC WOMENS DAILY MULTIVITAMIN) TABS Take one tablet once daily Oral   venlafaxine  XR (EFFEXOR  XR) 37.5 MG 24 hr capsule Take three capsule daily   Vitamin D , Ergocalciferol , (DRISDOL) 1.25 MG (50000 UNIT) CAPS capsule Take 50,000 Units by mouth once a week.   XIIDRA 5 % SOLN Apply to eye 2 (two) times daily.   No facility-administered encounter medications on file as of 07/02/2024.    Recent Results (from the past 2160 hours)  HM COLONOSCOPY     Status: None   Collection Time: 05/23/24  2:46 PM  Result Value Ref Range   HM Colonoscopy See Report (in chart) See Report (in chart), Patient Reported    Comment: ABSTRACTED BY HIM     Psychiatric Specialty Exam: Physical Exam  Review of Systems  Weight 140 lb (63.5 kg), last menstrual period 06/28/2000.There is no height or weight on file to calculate BMI.  General Appearance: Casual  Eye Contact:  Good  Speech:  Clear and Coherent and Normal Rate  Volume:  Normal  Mood:  Euthymic  Affect:  Appropriate  Thought Process:  Goal Directed  Orientation:  Full (Time, Place, and Person)  Thought Content:  Logical  Suicidal Thoughts:  No  Homicidal Thoughts:  No  Memory:  Immediate;   Good Recent;   Good Remote;   Good  Judgement:  Good  Insight:  Good  Psychomotor Activity:  Normal  Concentration:  Concentration: Good and Attention Span: Good  Recall:  Good  Fund of Knowledge:  Good  Language:  Good  Akathisia:  No  Handed:  Right  AIMS (if indicated):     Assets:  Communication Skills Desire for Improvement Housing Resilience Talents/Skills Transportation  ADL's:  Intact  Cognition:  WNL  Sleep:  using CPAP occasionally takes melatonin       02/06/2024    2:10 PM 10/28/2023   10:51 AM 05/25/2023     3:26 PM 07/22/2022    3:23 PM 07/20/2021    3:14 PM  Depression screen PHQ 2/9  Decreased Interest 1 1 3 3 1   Down, Depressed, Hopeless 2 1 3 2 1   PHQ - 2 Score 3 2 6 5 2   Altered sleeping 3 0 3 2 1   Tired, decreased energy 3 1 3 3 3   Change in appetite 0 0 3 2 0  Feeling bad or failure about yourself  1 0 1 1 1   Trouble concentrating 1 1 3 1 1   Moving slowly or fidgety/restless 0 0 2 0 0  Suicidal thoughts 0 0 0 0 0  PHQ-9 Score 11  4  21  14  8    Difficult doing work/chores  Not difficult at all Extremely dIfficult       Data saved with a previous flowsheet row definition    Assessment/Plan: Moderate episode of recurrent major depressive disorder (HCC) - Plan: buPROPion  (WELLBUTRIN  XL) 300 MG 24 hr tablet, venlafaxine  XR (EFFEXOR  XR) 37.5 MG 24 hr capsule  Anxiety - Plan: venlafaxine  XR (EFFEXOR  XR) 37.5 MG 24 hr capsule  Patient is stable on combination of venlafaxine  and Wellbutrin .  She has not take Xanax  in a while even though she felt she may need it before or during holidays.  She finished therapy with Peggy Bynum and have enough resources that if needed she can use them.  Discussed current medication.  She has no major concern.  Continue venlafaxine  112.5 mg daily and Wellbutrin  XL 300 mg daily.  Recommend to call back if he has any question or any concern.  Follow-up in 3 months.  Follow Up Instructions:     I discussed the assessment and treatment plan with the patient. The patient was provided an opportunity to ask questions and all were answered. The patient agreed with the plan and demonstrated an understanding of the instructions.   The patient was advised to call back or seek an in-person evaluation if the symptoms worsen or if the condition fails to improve as anticipated.    Collaboration of Care: Other provider involved in patient's care AEB notes are available in epic to review  Patient/Guardian was advised Release of Information must be obtained prior to any  record release in order to collaborate their care with an outside provider. Patient/Guardian was advised if they have not already done so to contact the registration department to sign all necessary forms in order for us  to release information regarding their care.   Consent: Patient/Guardian gives verbal consent for treatment and assignment of benefits for services provided during this visit. Patient/Guardian expressed understanding and agreed to proceed.     Total encounter time 16 minutes which includes face-to-face time, chart reviewed, care coordination, order entry and documentation during this encounter.   Note: This document was prepared by [employer] voice dictation technology and any errors that results from this process are unintentional.    Leni ONEIDA Client, MD 07/02/2024   "

## 2024-07-18 ENCOUNTER — Other Ambulatory Visit (HOSPITAL_COMMUNITY): Payer: Self-pay | Admitting: Psychiatry

## 2024-07-18 DIAGNOSIS — F331 Major depressive disorder, recurrent, moderate: Secondary | ICD-10-CM

## 2024-09-18 ENCOUNTER — Ambulatory Visit: Admitting: Obstetrics and Gynecology

## 2025-01-03 ENCOUNTER — Ambulatory Visit: Admitting: Neurology
